# Patient Record
Sex: Female | Born: 1970 | Race: White | Hispanic: No | Marital: Married | Smoking: Current every day smoker
Health system: Southern US, Community
[De-identification: ages and names within clinical notes are randomized; demographics above are authoritative.]

## PROBLEM LIST (undated history)

## (undated) DIAGNOSIS — Z87440 Personal history of urinary (tract) infections: Secondary | ICD-10-CM

## (undated) DIAGNOSIS — Z8541 Personal history of malignant neoplasm of cervix uteri: Secondary | ICD-10-CM

## (undated) DIAGNOSIS — F419 Anxiety disorder, unspecified: Secondary | ICD-10-CM

## (undated) DIAGNOSIS — M797 Fibromyalgia: Secondary | ICD-10-CM

## (undated) DIAGNOSIS — M359 Systemic involvement of connective tissue, unspecified: Secondary | ICD-10-CM

## (undated) DIAGNOSIS — F17200 Nicotine dependence, unspecified, uncomplicated: Secondary | ICD-10-CM

## (undated) DIAGNOSIS — R110 Nausea: Secondary | ICD-10-CM

## (undated) DIAGNOSIS — G8929 Other chronic pain: Secondary | ICD-10-CM

## (undated) DIAGNOSIS — M4802 Spinal stenosis, cervical region: Secondary | ICD-10-CM

## (undated) DIAGNOSIS — K635 Polyp of colon: Secondary | ICD-10-CM

## (undated) DIAGNOSIS — M542 Cervicalgia: Secondary | ICD-10-CM

## (undated) DIAGNOSIS — R768 Other specified abnormal immunological findings in serum: Secondary | ICD-10-CM

## (undated) DIAGNOSIS — E039 Hypothyroidism, unspecified: Secondary | ICD-10-CM

## (undated) DIAGNOSIS — F32A Depression, unspecified: Secondary | ICD-10-CM

## (undated) DIAGNOSIS — F329 Major depressive disorder, single episode, unspecified: Secondary | ICD-10-CM

## (undated) HISTORY — DX: Polyp of colon: K63.5

## (undated) HISTORY — DX: Fibromyalgia: M79.7

## (undated) HISTORY — DX: Nausea: R11.0

## (undated) HISTORY — DX: Anxiety disorder, unspecified: F41.9

## (undated) HISTORY — DX: Personal history of urinary (tract) infections: Z87.440

## (undated) HISTORY — DX: Cervicalgia: M54.2

## (undated) HISTORY — PX: OTHER SURGICAL HISTORY: SHX169

## (undated) HISTORY — DX: Systemic involvement of connective tissue, unspecified: M35.9

## (undated) HISTORY — DX: Other specified abnormal immunological findings in serum: R76.8

## (undated) HISTORY — DX: Hypothyroidism, unspecified: E03.9

## (undated) HISTORY — DX: Personal history of malignant neoplasm of cervix uteri: Z85.41

## (undated) HISTORY — DX: Spinal stenosis, cervical region: M48.02

## (undated) HISTORY — DX: Other chronic pain: G89.29

## (undated) HISTORY — DX: Nicotine dependence, unspecified, uncomplicated: F17.200

---
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
## 1997-04-24 HISTORY — PX: VAGINAL HYSTERECTOMY: SHX2639

## 2002-04-24 HISTORY — PX: TRANSTHORACIC ECHOCARDIOGRAM: SHX275

## 2002-04-24 HISTORY — PX: OTHER SURGICAL HISTORY: SHX169

## 2003-04-25 HISTORY — PX: ESOPHAGOGASTRODUODENOSCOPY: SHX1529

## 2005-04-24 HISTORY — PX: OOPHORECTOMY: SHX86

## 2006-04-24 HISTORY — PX: OTHER SURGICAL HISTORY: SHX169

## 2006-11-04 ENCOUNTER — Emergency Department (HOSPITAL_COMMUNITY): Admission: EM | Admit: 2006-11-04 | Discharge: 2006-11-04 | Payer: Self-pay | Admitting: Emergency Medicine

## 2009-12-28 ENCOUNTER — Emergency Department (HOSPITAL_COMMUNITY): Admission: EM | Admit: 2009-12-28 | Discharge: 2009-12-28 | Payer: Self-pay | Admitting: Emergency Medicine

## 2010-04-24 HISTORY — PX: OOPHORECTOMY: SHX86

## 2011-12-08 DIAGNOSIS — Z0000 Encounter for general adult medical examination without abnormal findings: Secondary | ICD-10-CM

## 2013-02-26 ENCOUNTER — Other Ambulatory Visit: Payer: Self-pay | Admitting: Neurology

## 2013-02-26 DIAGNOSIS — R270 Ataxia, unspecified: Secondary | ICD-10-CM

## 2013-02-26 DIAGNOSIS — R269 Unspecified abnormalities of gait and mobility: Secondary | ICD-10-CM

## 2013-03-18 ENCOUNTER — Ambulatory Visit (HOSPITAL_COMMUNITY): Admission: RE | Admit: 2013-03-18 | Payer: BC Managed Care – PPO | Source: Ambulatory Visit

## 2013-03-18 ENCOUNTER — Other Ambulatory Visit (HOSPITAL_COMMUNITY): Payer: Self-pay

## 2013-03-24 ENCOUNTER — Ambulatory Visit (HOSPITAL_COMMUNITY): Payer: BC Managed Care – PPO

## 2013-04-01 ENCOUNTER — Ambulatory Visit (HOSPITAL_COMMUNITY): Payer: BC Managed Care – PPO

## 2013-04-07 ENCOUNTER — Ambulatory Visit (HOSPITAL_COMMUNITY): Admission: RE | Admit: 2013-04-07 | Payer: BC Managed Care – PPO | Source: Ambulatory Visit

## 2013-04-23 ENCOUNTER — Other Ambulatory Visit: Payer: Self-pay | Admitting: Neurology

## 2013-04-23 DIAGNOSIS — M542 Cervicalgia: Secondary | ICD-10-CM

## 2013-05-02 ENCOUNTER — Ambulatory Visit (HOSPITAL_COMMUNITY)
Admission: RE | Admit: 2013-05-02 | Discharge: 2013-05-02 | Disposition: A | Discharge status: Home / Self Care | Payer: BC Managed Care – PPO | Source: Ambulatory Visit | Attending: Neurology | Admitting: Neurology

## 2013-05-02 ENCOUNTER — Encounter (HOSPITAL_COMMUNITY): Payer: Self-pay

## 2013-05-02 DIAGNOSIS — R279 Unspecified lack of coordination: Secondary | ICD-10-CM | POA: Insufficient documentation

## 2013-05-02 DIAGNOSIS — R270 Ataxia, unspecified: Secondary | ICD-10-CM

## 2013-05-02 DIAGNOSIS — M5020 Other cervical disc displacement, unspecified cervical region: Secondary | ICD-10-CM | POA: Insufficient documentation

## 2013-05-02 DIAGNOSIS — R269 Unspecified abnormalities of gait and mobility: Secondary | ICD-10-CM | POA: Insufficient documentation

## 2013-05-02 DIAGNOSIS — M542 Cervicalgia: Secondary | ICD-10-CM

## 2013-05-02 DIAGNOSIS — M5380 Other specified dorsopathies, site unspecified: Secondary | ICD-10-CM | POA: Insufficient documentation

## 2013-05-02 DIAGNOSIS — G935 Compression of brain: Secondary | ICD-10-CM | POA: Insufficient documentation

## 2013-05-06 ENCOUNTER — Other Ambulatory Visit: Payer: Self-pay | Admitting: Neurology

## 2014-07-24 ENCOUNTER — Ambulatory Visit: Payer: Self-pay | Admitting: Family Medicine

## 2014-07-27 ENCOUNTER — Ambulatory Visit (INDEPENDENT_AMBULATORY_CARE_PROVIDER_SITE_OTHER): Payer: 59 | Admitting: Family Medicine

## 2014-07-27 ENCOUNTER — Encounter: Payer: Self-pay | Admitting: Family Medicine

## 2014-07-27 VITALS — BP 122/79 | HR 78 | Temp 99.0°F | Ht 61.0 in | Wt 120.0 lb

## 2014-07-27 DIAGNOSIS — F411 Generalized anxiety disorder: Secondary | ICD-10-CM

## 2014-07-27 DIAGNOSIS — M542 Cervicalgia: Secondary | ICD-10-CM

## 2014-07-27 DIAGNOSIS — M4802 Spinal stenosis, cervical region: Secondary | ICD-10-CM

## 2014-07-27 DIAGNOSIS — E039 Hypothyroidism, unspecified: Secondary | ICD-10-CM | POA: Diagnosis not present

## 2014-07-27 DIAGNOSIS — G8929 Other chronic pain: Secondary | ICD-10-CM

## 2014-07-27 LAB — TSH: TSH: 73.02 u[IU]/mL — ABNORMAL HIGH (ref 0.35–4.50)

## 2014-07-27 LAB — T4, FREE: FREE T4: 0.61 ng/dL (ref 0.60–1.60)

## 2014-07-27 NOTE — Progress Notes (Signed)
Pre visit review using our clinic review tool, if applicable. No additional management support is needed unless otherwise documented below in the visit note. 

## 2014-07-27 NOTE — Progress Notes (Addendum)
Office Note 08/02/2014  CC:  Chief Complaint  Patient presents with  . Establish Care   HPI:  Victoria Galvan is a 44 y.o. White female who is here to establish care. Patient's most recent primary MD: Dr. Ceasar MonsHarry Coletta in [place], [place]. Old records in EPIC/HL were reviewed prior to or during today's visit.  Says she is switching PCPs b/c unsatisfied with "monitoring" in the past.   Dr. Channing Muttersoy is her neurosurgeon (no surgery has been done, pt spoke of possible plan for epidural steroid injections but none done yet per pt), but she says Dr. Jenean Lindauoletta has been filling her pain meds. She says Dr. Channing Muttersoy drew blood and after results returned he told her he wants her to see a rheumatologist.  No referral has been made.  She says she was not told why she needs to see a rheumatologist.  She recently tried to establish with a Dr. Jillyn HiddenFulp, a primary care MD in [place] but after one visit the patient says she was called the next day and told she couldn't be a patient there anymore, yet she prescribed the patient pain meds (07/21/15)--small amount.  I called Dr. Jillyn HiddenFulp today and she corroborated pt's basic story but says no records have ever been received from Dr. Jenean Lindauoletta or Dr. Channing Muttersoy. Pt says she was rx'd #120 of her oxycodone 30mg  on 07/25/14.  She says she took one of these pills about 1 hour ago and most recent xanax was this morning.   She states she has never been to a pain clinic before but wants referral to one now.  She has stopped her levothyroxine since 05/11/14 in attempt to see how she felt off of it, not knowing that this was not advised.  She notes no wt gain or fluid accumulation since d/c of this med.  Past Medical History  Diagnosis Date  . Tobacco dependence   . Hypothyroidism   . Cervical spinal stenosis   . Chronic neck pain   . Anxiety disorder     with panic  . Cancer   . Syphilis   . Colon polyps   . History of frequent urinary tract infections     Past Surgical History  Procedure  Laterality Date  . Vaginal hysterectomy  1999    for cervical cancer per pt  . Tonsillectomy  1978  . Oophorectomy  2007    right  . Adhesions removed  2008  . Oophorectomy  2012    left    Family History  Problem Relation Age of Onset  . Arthritis Mother   . Cancer Mother     Lung  . Arthritis Father     History   Social History  . Marital Status: Married    Spouse Name: N/A  . Number of Children: 2  . Years of Education: College   Occupational History  . CNA Unemployed   Social History Main Topics  . Smoking status: Current Every Day Smoker  . Smokeless tobacco: Never Used  . Alcohol Use: No  . Drug Use: No  . Sexual Activity: Not on file   Other Topics Concern  . Not on file   Social History Narrative   Married,   Education: GED, Copywriter, advertisingCNA training.   Occupation: unemployed, formerly worked as a Careers information officerCNA    Outpatient Encounter Prescriptions as of 07/27/2014  Medication Sig  . alprazolam (XANAX) 2 MG tablet Take 2 mg by mouth at bedtime as needed for sleep.  Marland Kitchen. augmented betamethasone  dipropionate (DIPROLENE-AF) 0.05 % cream Apply topically 2 (two) times daily.  . Oxycodone HCl 10 MG TABS Take 30 mg by mouth.  . estradiol (ESTRACE) 1 MG tablet Take 1 mg by mouth daily.  Marland Kitchen levothyroxine (SYNTHROID, LEVOTHROID) 200 MCG tablet Take 200 mcg by mouth daily before breakfast.    No Known Allergies  ROS Review of Systems  Constitutional: Negative for fever and fatigue.  HENT: Negative for congestion and sore throat.   Eyes: Negative for visual disturbance.  Respiratory: Negative for cough.   Cardiovascular: Negative for chest pain.  Gastrointestinal: Negative for nausea and abdominal pain.  Genitourinary: Negative for dysuria.  Musculoskeletal: Negative for back pain and joint swelling.  Skin: Negative for rash.  Neurological: Negative for weakness and headaches.  Hematological: Negative for adenopathy.    PE; Blood pressure 122/79, pulse 78, temperature 99 F  (37.2 C), temperature source Temporal, height  (1.549 m), weight 120 lb (54.432 kg), SpO2 97 %. Gen: Alert, well appearing.  Patient is oriented to person, place, time, and situation. ZOX:WRUE: no injection, icteris, swelling, or exudate.  EOMI, PERRLA. Mouth: lips without lesion/swelling.  Oral mucosa pink and moist. Oropharynx without erythema, exudate, or swelling.  Neck - No masses or thyromegaly or limitation in range of motion CV: RRR, no m/r/g.   LUNGS: CTA bilat, nonlabored resps, good aeration in all lung fields. EXT: no clubbing, cyanosis, or edema.   Pertinent labs:  none  ASSESSMENT AND PLAN:   New pt; need to obtain old records.  1) Hypothyroidism; off meds per her own decision for last 4 mo. Will have her restart her L4 200 mcg qd and will check TSH, T4, T3 total today.  2) Chronic neck pain; has neurosurgeon (Dr. Channing Mutters).  Pain med mgmt is something that is not clear and of course this is concerning.  I declined to rx any pain meds for pt today and made it clear that she would need her pain managed by a pain mgmt MD OR if her labs did in fact recently indicate need for rheum consult then will order this next as appropriate.   Urine tox screen done today Will make approp referrals after approp old records/labs gathered.  3) Anxiety disorder with panic attacks: no rx's given today.  I told her I was hesitant to rx any controlled substances for her before getting approp results on urine tox screen today AND also getting old records.  An After Visit Summary was printed and given to the patient.  Spent 30 min with pt today, with >50% of this time spent in counseling and care coordination regarding the above problems.  Return in about 2 weeks (around 08/10/2014) for 30 min appt, pain/anxiety.

## 2014-07-28 ENCOUNTER — Telehealth: Payer: Self-pay | Admitting: Family Medicine

## 2014-07-28 NOTE — Telephone Encounter (Signed)
emmi mailed  °

## 2014-07-30 ENCOUNTER — Other Ambulatory Visit: Payer: Self-pay | Admitting: *Deleted

## 2014-07-30 DIAGNOSIS — E039 Hypothyroidism, unspecified: Secondary | ICD-10-CM

## 2014-08-02 ENCOUNTER — Encounter: Payer: Self-pay | Admitting: Family Medicine

## 2014-08-03 ENCOUNTER — Telehealth: Payer: Self-pay | Admitting: Family Medicine

## 2014-08-03 NOTE — Telephone Encounter (Signed)
Patient called back with preferred provider info: Dr. Layla BarterGoldberg [phone] & Dr. Jordan LikesSpivey [phone]. Patient is about to run out of her pain med so she is requesting appt asap

## 2014-08-03 NOTE — Telephone Encounter (Signed)
Patient is requesting pain management referral. She will CB to advise who her preferred providers are.

## 2014-08-04 ENCOUNTER — Other Ambulatory Visit: Payer: Self-pay | Admitting: Family Medicine

## 2014-08-04 ENCOUNTER — Telehealth: Payer: Self-pay | Admitting: Family Medicine

## 2014-08-04 DIAGNOSIS — M542 Cervicalgia: Principal | ICD-10-CM

## 2014-08-04 DIAGNOSIS — G8929 Other chronic pain: Secondary | ICD-10-CM

## 2014-08-04 DIAGNOSIS — M5030 Other cervical disc degeneration, unspecified cervical region: Secondary | ICD-10-CM

## 2014-08-04 NOTE — Telephone Encounter (Signed)
I'll put in referral order, but it is out of our hands as far as how quickly she will be seen by them. I recommend she ration her pain medications accordingly.-thx

## 2014-08-04 NOTE — Telephone Encounter (Signed)
Patient is nauseous and unable to eat. She is not running a fever. Can an Rx be sent to [facility]? Until an Rx be written is there an OTC medication that she can try?

## 2014-08-04 NOTE — Telephone Encounter (Signed)
Please advise OTC med?

## 2014-08-05 ENCOUNTER — Telehealth: Payer: Self-pay | Admitting: Family Medicine

## 2014-08-05 ENCOUNTER — Encounter: Payer: Self-pay | Admitting: Family Medicine

## 2014-08-05 NOTE — Telephone Encounter (Signed)
Patient notified. Patient did not know about referral to see Dr. Lendon ColonelHawks. Dr. Milinda CaveMcGowen aware.

## 2014-08-05 NOTE — Telephone Encounter (Signed)
Pt was returning a call from our office. Please call her at [phone]

## 2014-08-05 NOTE — Telephone Encounter (Signed)
Done

## 2014-08-05 NOTE — Telephone Encounter (Signed)
May try meclizine otc as directed on packaging.

## 2014-08-05 NOTE — Telephone Encounter (Signed)
Also, in review of the note from Dr. Jillyn HiddenFulp from [facility] (the MD she was x 1 visit right before switching to me), it says that she was referred by them to Dr. Nickola MajorHawkes, a rheumatologist in [place]. Ask pt if she knew about this and if she has an appt set or not.-thx

## 2014-08-05 NOTE — Telephone Encounter (Signed)
Patient notified. See other note.

## 2014-08-05 NOTE — Telephone Encounter (Signed)
LMOVM for pt to return call 

## 2014-08-06 ENCOUNTER — Telehealth: Payer: Self-pay | Admitting: Family Medicine

## 2014-08-06 ENCOUNTER — Other Ambulatory Visit: Payer: 59

## 2014-08-06 DIAGNOSIS — M791 Myalgia, unspecified site: Secondary | ICD-10-CM

## 2014-08-06 NOTE — Telephone Encounter (Signed)
Pt aware.  She is scheduled for lab visit today at 11:30.

## 2014-08-06 NOTE — Telephone Encounter (Signed)
I have received Dr. Temple Pacinioy's records. Pls tell pt that the lab he did that provoked the recommendation of referral to rheumatology was not totally conclusive.  There is a more specific test that I want her to come in for to clarify this, then if there is a lab abnormality of concern that requires rheumatology referral I will make it at that time.

## 2014-08-07 ENCOUNTER — Other Ambulatory Visit: Payer: Self-pay | Admitting: Family Medicine

## 2014-08-07 DIAGNOSIS — E039 Hypothyroidism, unspecified: Secondary | ICD-10-CM

## 2014-08-07 DIAGNOSIS — R768 Other specified abnormal immunological findings in serum: Secondary | ICD-10-CM | POA: Insufficient documentation

## 2014-08-07 DIAGNOSIS — M791 Myalgia, unspecified site: Secondary | ICD-10-CM

## 2014-08-07 DIAGNOSIS — R7689 Other specified abnormal immunological findings in serum: Secondary | ICD-10-CM

## 2014-08-07 LAB — ENA+DNA/DS+SJORGEN'S: ENA SM Ab Ser-aCnc: <0.2 AI (ref 0.0–0.9)

## 2014-08-07 LAB — ANA W/REFLEX: Anti Nuclear Antibody(ANA): POSITIVE — AB

## 2014-08-09 LAB — CK: CK TOTAL: 74 U/L (ref 24–173)

## 2014-08-10 ENCOUNTER — Ambulatory Visit (INDEPENDENT_AMBULATORY_CARE_PROVIDER_SITE_OTHER): Payer: 59 | Admitting: Family Medicine

## 2014-08-10 ENCOUNTER — Encounter: Payer: Self-pay | Admitting: Family Medicine

## 2014-08-10 VITALS — BP 111/79 | HR 79 | Temp 98.0°F | Ht 61.0 in | Wt 121.0 lb

## 2014-08-10 DIAGNOSIS — R1903 Right lower quadrant abdominal swelling, mass and lump: Secondary | ICD-10-CM | POA: Diagnosis not present

## 2014-08-10 DIAGNOSIS — R110 Nausea: Secondary | ICD-10-CM

## 2014-08-10 LAB — COMPREHENSIVE METABOLIC PANEL
ALT: 16 U/L (ref 0–35)
AST: 20 U/L (ref 0–37)
Albumin: 4.8 g/dL (ref 3.5–5.2)
Alkaline Phosphatase: 97 U/L (ref 39–117)
BUN: 10 mg/dL (ref 6–23)
CALCIUM: 10.1 mg/dL (ref 8.4–10.5)
CO2: 25 meq/L (ref 19–32)
Chloride: 103 mEq/L (ref 96–112)
Creatinine, Ser: 0.83 mg/dL (ref 0.40–1.20)
GFR: 79.91 mL/min (ref >60.00)
GLUCOSE: 86 mg/dL (ref 70–99)
POTASSIUM: 4.2 meq/L (ref 3.5–5.1)
SODIUM: 138 meq/L (ref 135–145)
Total Bilirubin: 0.4 mg/dL (ref 0.2–1.2)
Total Protein: 7.9 g/dL (ref 6.0–8.3)

## 2014-08-10 LAB — CBC WITH DIFFERENTIAL/PLATELET
BASOS ABS: 0.1 10*3/uL (ref 0.0–0.1)
Basophils Relative: 0.5 % (ref 0.0–3.0)
EOS ABS: 0.3 10*3/uL (ref 0.0–0.7)
Eosinophils Relative: 2.5 % (ref 0.0–5.0)
HCT: 38.1 % (ref 36.0–46.0)
Hemoglobin: 12.9 g/dL (ref 12.0–15.0)
LYMPHS PCT: 44.2 % (ref 12.0–46.0)
Lymphs Abs: 4.8 10*3/uL — ABNORMAL HIGH (ref 0.7–4.0)
MCHC: 33.9 g/dL (ref 30.0–36.0)
MCV: 96.3 fl (ref 78.0–100.0)
MONO ABS: 1.1 10*3/uL — AB (ref 0.1–1.0)
Monocytes Relative: 10.1 % (ref 3.0–12.0)
NEUTROS ABS: 4.6 10*3/uL (ref 1.4–7.7)
NEUTROS PCT: 42.7 % — AB (ref 43.0–77.0)
Platelets: 350 10*3/uL (ref 150.0–400.0)
RBC: 3.96 Mil/uL (ref 3.87–5.11)
RDW: 14.2 % (ref 11.5–15.5)
WBC: 10.9 10*3/uL — AB (ref 4.0–10.5)

## 2014-08-10 LAB — SPECIMEN STATUS REPORT

## 2014-08-10 LAB — POCT URINALYSIS DIPSTICK
BILIRUBIN UA: NEGATIVE
Blood, UA: NEGATIVE
GLUCOSE UA: NEGATIVE
KETONES UA: NEGATIVE
LEUKOCYTES UA: NEGATIVE
Nitrite, UA: NEGATIVE
PH UA: 6
PROTEIN UA: NEGATIVE
Spec Grav, UA: <=1.005
UROBILINOGEN UA: 0.2

## 2014-08-10 LAB — FANA STAINING PATTERNS: Speckled Pattern: 1:80 {titer}

## 2014-08-10 LAB — ANTINUCLEAR ANTIBODIES, IFA

## 2014-08-10 LAB — H. PYLORI ANTIBODY, IGG: H Pylori IgG: NEGATIVE

## 2014-08-10 LAB — ANGIOTENSIN CONVERTING ENZYME: ANGIO CONVERT ENZYME: 66 U/L (ref 14–82)

## 2014-08-10 MED ORDER — ONDANSETRON 8 MG PO TBDP: 8.0000 mg | ORAL_TABLET | Freq: Two times a day (BID) | ORAL | Status: DC

## 2014-08-10 NOTE — Progress Notes (Signed)
Pre visit review using our clinic review tool, if applicable. No additional management support is needed unless otherwise documented below in the visit note. 

## 2014-08-10 NOTE — Progress Notes (Signed)
OFFICE VISIT  08/13/2014   CC:  Chief Complaint  Patient presents with  . Follow-up    2 weeks  . Nausea    every morning   HPI:    Patient is a 44 y.o. Caucasian female who presents for 2 wk f/u after establishing care. Got back on her synthroid for her hypothyroidism.  Some old records have been received since last visit, most importantly Dr. Temple Pacinioy's labs done at his most recent o/v 07/08/14: showed +ANA w/out any titer done, Rh factor neg, normal sed rate and CRP. His last o/v dx was cervical spondylosis for which he recommended no precedural intervention as long as she was neurologically intact. Also reviewed her prior PCP's records (Dr. Jenean Lindauoletta) as well as recent initial/establish care visit with Dr. Jillyn HiddenFulp (whom she ended up not staying with--see note from last visit).  I entered a pain medicine referral order for chronic neck pain/cervical spondylosis on 08/04/14. Reviewed urine tox screen results from last visit with pt: appropriate results except + oxazepam (serax, plus possible metabolite of diaz, temaz, halaz, prazepam, and librium).  Pt states that just prior to seeing me and giving this specimen she went [facility] ED and was given an injection of "something", possibly valium. I will try to obtain records to corroborate this new info given today.   As far as rheum sx's, she does c/o years of pain "in all my joints and muscles", then when questioned further she essentially has pain symmetrically affecting both knees, both elbows, both hips, both wrists.  Notes long hx of swelling in fingers diffusely towards the end of the day. She denies hx of any joints swelling or turning red.  Says she has a rash occ on her face/cheeks that "looks like I'm wind-burned".  Tends to be easily sunburned. No hx of eye pain or eye redness.  No unexplained fevers.  Reports nausea in the mornings for the last 2-4 wks--pt unable to be specific with duration-, no abd pain.  Says it is not a  post-prandial occurrence.  Says promethazine rx'd in recent past by the ED (?which one?) helped but she goes on to say "another pill that dissolves under your tongue" helped better/quicker for this in the past.  This makes me even more unclear now on her report of when her nausea actually started becoming a problem.  At any rate, no fevers, no blood in stool, no wt loss since last visit with me. Denies urinary urgency, frequency, or dysuria or hematuria.    Past Medical History  Diagnosis Date  . Tobacco dependence   . Hypothyroidism   . Cervical spinal stenosis   . Chronic neck pain   . Anxiety disorder     with panic  . History of cervical cancer   . Colon polyps     Pt states "colon polyp was removed when they did my hysterectomy"--need old records to veriffy.  . History of frequent urinary tract infections   . Fibromyalgia     per pt report    Past Surgical History  Procedure Laterality Date  . Vaginal hysterectomy  1999    for cervical cancer per pt  . Tonsillectomy  1978  . Oophorectomy  2007    right-path neg  . Adhesions removed  2008  . Oophorectomy  2012    left  . Esophagogastroduodenoscopy  2005    mild gastritis; h pylori neg.  Says she has had esoph dilation several times  . Transthoracic echocardiogram  2004    Normal  . Holter monitor  2004    Normal  . Mri brain  2002;2004    Normal    Outpatient Prescriptions Prior to Visit  Medication Sig Dispense Refill  . alprazolam (XANAX) 2 MG tablet Take 2 mg by mouth at bedtime as needed for sleep.    Marland Kitchen augmented betamethasone dipropionate (DIPROLENE-AF) 0.05 % cream Apply topically 2 (two) times daily.    Marland Kitchen estradiol (ESTRACE) 1 MG tablet Take 1 mg by mouth daily.    Marland Kitchen levothyroxine (SYNTHROID, LEVOTHROID) 200 MCG tablet Take 200 mcg by mouth daily before breakfast.    . Oxycodone HCl 10 MG TABS Take 30 mg by mouth.     No facility-administered medications prior to visit.    Allergies  Allergen Reactions   . Aspirin Other (See Comments)    Unknown rx; this allergy was simply noted in former PCP's records    ROS As per HPI  PE: Blood pressure 111/79, pulse 79, temperature 98 F (36.7 C), temperature source Oral, height  (1.549 m), weight 121 lb (54.885 kg), SpO2 96 %.  Pt examined with Faythe Ghee, CMA, as chaperone. Gen: Alert, well appearing.  Patient is oriented to person, place, time, and situation. XBM:WUXL: no injection, icteris, swelling, or exudate.  EOMI, PERRLA. Mouth: lips without lesion/swelling.  Oral mucosa pink and moist. Oropharynx without erythema, exudate, or swelling.  ABD: soft, nondistended.  BS normal.  Abd aorta palpable pulsations but no bruit.  RLQ TTp with question of RLQ fullness/mass. Also some TTP directly over her umbillicus but no mass palpable here.  No HSM,   EXT: no clubbing, cyanosis, or edema.  Musculoskeletal: no joint swelling, erythema, warmth, or tenderness.  ROM of all joints intact.  LABS:  CC UA today: NEG/normal   08/06/14: ANA pos, speckled pattern, 1:80. RNP Ab >8 but other specific Ab were neg (dsDNA, SSA, SSB, SM Ab).  On 08/06/14:  ACE level 66 CK total 74  IMPRESSION AND PLAN:  1) Morning nausea x at least 1 mo, with question of RLQ fullness/mass.  She has hx of cervical ca and has had hysterectomy with both tubes and ovaries removed.  Will check CMET, CBC w/diff, lipase, h pylori ab and will check pelvic u/s. Zofran ODT 8 mg bid prn rx'd, #60, RF x 1.  2) Positive ANA, +RNP Ab. Of unclear clinical significance but will ask rheumatology to see her, as per Dr. Temple Pacini initial plan. It is unclear from past correspondence with Dr. Temple Pacini office whether a referral was already made to Dr. Fatima Sanger office (pt does not know), so I'll try to verify this before making new referral today.  3) Hypothyroidism: she has been back on L4 replacement now for about 2 wks. Recheck TSH 6 wks.  4) Chronic neck pain; pain mgmt referral has been  made and pt is awaiting further appt info at this time. UDS with approp results last visit except + oxazepam.  See pt's explanation in HPI above.  Will try to obtain [facility] ED records.  An After Visit Summary was printed and given to the patient.  FOLLOW UP: Return in about 6 weeks (around 09/21/2014) for f/u nausea and f/u referrals(30 min).

## 2014-08-11 ENCOUNTER — Other Ambulatory Visit: Payer: Self-pay | Admitting: Family Medicine

## 2014-08-11 DIAGNOSIS — R1903 Right lower quadrant abdominal swelling, mass and lump: Secondary | ICD-10-CM

## 2014-08-11 DIAGNOSIS — R110 Nausea: Secondary | ICD-10-CM

## 2014-08-11 LAB — LIPASE: LIPASE: 38 U/L (ref 11.0–59.0)

## 2014-08-13 ENCOUNTER — Ambulatory Visit (HOSPITAL_COMMUNITY): Admission: RE | Admit: 2014-08-13 | Payer: 59 | Source: Ambulatory Visit

## 2014-08-13 ENCOUNTER — Other Ambulatory Visit (HOSPITAL_COMMUNITY): Payer: 59

## 2014-08-13 ENCOUNTER — Other Ambulatory Visit: Payer: Self-pay | Admitting: Family Medicine

## 2014-08-13 DIAGNOSIS — R768 Other specified abnormal immunological findings in serum: Secondary | ICD-10-CM

## 2014-08-13 DIAGNOSIS — M2550 Pain in unspecified joint: Secondary | ICD-10-CM

## 2014-08-18 ENCOUNTER — Encounter: Payer: Self-pay | Admitting: Family Medicine

## 2014-08-19 ENCOUNTER — Ambulatory Visit (HOSPITAL_COMMUNITY)
Admission: RE | Admit: 2014-08-19 | Discharge: 2014-08-19 | Disposition: A | Discharge status: Home / Self Care | Payer: 59 | Source: Ambulatory Visit | Attending: Family Medicine | Admitting: Family Medicine

## 2014-08-19 DIAGNOSIS — R110 Nausea: Secondary | ICD-10-CM | POA: Diagnosis not present

## 2014-08-19 DIAGNOSIS — R1903 Right lower quadrant abdominal swelling, mass and lump: Secondary | ICD-10-CM | POA: Diagnosis present

## 2014-08-19 NOTE — Progress Notes (Signed)
Quick Note:  Please notify patient that her pelvic ultrasound was normal. There was quite a bit of gas and stool in her bowels, and this makes them distended and this is likely the fullness that I was able to feel when I examined her abdomen at last visit. Reassure patient please. No further imaging necessary. Thanks. ______

## 2014-08-20 ENCOUNTER — Other Ambulatory Visit: Payer: Self-pay | Admitting: Family Medicine

## 2014-08-20 ENCOUNTER — Telehealth: Payer: Self-pay | Admitting: Family Medicine

## 2014-08-20 MED ORDER — OXYCODONE HCL 10 MG PO TABS: ORAL_TABLET | ORAL | Status: DC

## 2014-08-20 NOTE — Telephone Encounter (Signed)
Patient called back. She got an appt with [facility] 08/24/14 at 1:00PM. She needs just enough Oxycodone 30MG  1 4x's a day to make it to the appt. She uses [facility].

## 2014-08-20 NOTE — Telephone Encounter (Signed)
I know she got her most recent 1 mo supply of oxycodone on 07/25/14, so I did a rx for 3 days-worth of oxycodone that can be filled on 4/31/16.

## 2014-08-20 NOTE — Telephone Encounter (Signed)
Patient has never heard from Dr. Cyndia DiverSpivey's office. I faxed the pain management referral to Dr. Eduard ClosBethea & she refused patient & recommended that pt be seen at [facility]. I have faxed the referral to [facility]. The patient has contacted their office & they will not be able to make her appt before she runs out of her narcotic. Patient runs out 08/23/14. She wants to know if she can get an Rx to hold her until she gets in to Dr. Christianne DolinHeag's office?

## 2014-08-20 NOTE — Telephone Encounter (Signed)
Please advise 

## 2014-08-20 NOTE — Telephone Encounter (Signed)
I printed new rx for oxycodone with fill on/after date of 4/30 since prev rx said 4/31 and there are only 30 days in April. Initial rx shredded.

## 2014-08-20 NOTE — Telephone Encounter (Signed)
Pt sates that her pharmacy is not open on Sunday.  She would like it to be filled Saturday if possible.

## 2014-08-21 ENCOUNTER — Other Ambulatory Visit: Payer: Self-pay | Admitting: Family Medicine

## 2014-08-21 MED ORDER — OXYCODONE HCL 30 MG PO TABS: ORAL_TABLET | ORAL | Status: DC

## 2014-09-03 ENCOUNTER — Telehealth: Payer: Self-pay | Admitting: Family Medicine

## 2014-09-03 NOTE — Telephone Encounter (Signed)
LOV 08/10/14. Please advised. Thanks.

## 2014-09-03 NOTE — Telephone Encounter (Signed)
OK to RF levothyroxine 200 mcg tabs, 1 tab po qd, #30, one additional RF.  She needs lab visit in 3-4 weeks to recheck TSH, dx hypothyroidism. Also, before authorizing RF of her estradiol, pls call her pharmacy and clarify the dosing of her last rx b/c in our chart we have that it was 1 tab per day (but she requested dosing of 2 tabs per day??)-thx

## 2014-09-03 NOTE — Telephone Encounter (Signed)
Estrediol 1 MG 2 tablets a day  Levothyroxine 200 MCG 1 tablet a day [facility]  Patient no longer sees her OBGYN since she has had a hysterectomy so this will be a new Rx

## 2014-09-04 MED ORDER — LEVOTHYROXINE SODIUM 200 MCG PO TABS: 200.0000 ug | ORAL_TABLET | Freq: Every day | ORAL | Status: DC

## 2014-09-04 MED ORDER — ESTRADIOL 1 MG PO TABS: 1.0000 mg | ORAL_TABLET | Freq: Every day | ORAL | Status: DC

## 2014-09-04 NOTE — Telephone Encounter (Signed)
Sp[facility]w at [facility] and he stated that pt is taking Estradiol 1mg  (up to 2 tab daily) take 2x2days then 1x1day. I have sent in Rx for thyroid medication and I have left message for pt to call back to advised about labs.

## 2014-09-04 NOTE — Telephone Encounter (Signed)
Left message for pt to call back  °

## 2014-09-04 NOTE — Telephone Encounter (Signed)
I sent rx for estradiol 1mg , 1 tab qd.  I recommend against taking 2 mg dosing.

## 2014-09-04 NOTE — Telephone Encounter (Signed)
Pt advised and voiced understandings.

## 2014-09-18 ENCOUNTER — Ambulatory Visit: Payer: 59 | Admitting: Family Medicine

## 2014-09-22 ENCOUNTER — Ambulatory Visit: Payer: 59 | Admitting: Family Medicine

## 2014-09-24 ENCOUNTER — Encounter: Payer: Self-pay | Admitting: Family Medicine

## 2014-09-25 ENCOUNTER — Other Ambulatory Visit (INDEPENDENT_AMBULATORY_CARE_PROVIDER_SITE_OTHER): Payer: 59

## 2014-09-25 DIAGNOSIS — E039 Hypothyroidism, unspecified: Secondary | ICD-10-CM | POA: Diagnosis not present

## 2014-09-25 LAB — TSH: TSH: 8.98 u[IU]/mL — AB (ref 0.35–4.50)

## 2014-09-28 ENCOUNTER — Other Ambulatory Visit: Payer: Self-pay | Admitting: Family Medicine

## 2014-09-28 ENCOUNTER — Ambulatory Visit: Payer: 59 | Admitting: Family Medicine

## 2014-09-28 MED ORDER — LEVOTHYROXINE SODIUM 25 MCG PO CAPS
ORAL_CAPSULE | ORAL | Status: DC
Start: 2014-09-28 — End: 2016-05-09

## 2014-09-28 NOTE — Addendum Note (Signed)
Addended by: Westley HummerKIRBY, Kristiann Noyce L on: 09/28/2014 11:09 AM   Modules accepted: Orders

## 2014-10-07 ENCOUNTER — Encounter: Payer: Self-pay | Admitting: Family Medicine

## 2014-10-07 ENCOUNTER — Ambulatory Visit (INDEPENDENT_AMBULATORY_CARE_PROVIDER_SITE_OTHER): Payer: 59 | Admitting: Family Medicine

## 2014-10-07 VITALS — BP 123/85 | HR 79 | Temp 98.0°F | Resp 16 | Wt 121.0 lb

## 2014-10-07 DIAGNOSIS — R768 Other specified abnormal immunological findings in serum: Secondary | ICD-10-CM

## 2014-10-07 DIAGNOSIS — K219 Gastro-esophageal reflux disease without esophagitis: Secondary | ICD-10-CM

## 2014-10-07 DIAGNOSIS — F411 Generalized anxiety disorder: Secondary | ICD-10-CM

## 2014-10-07 DIAGNOSIS — M542 Cervicalgia: Secondary | ICD-10-CM | POA: Diagnosis not present

## 2014-10-07 DIAGNOSIS — M47812 Spondylosis without myelopathy or radiculopathy, cervical region: Secondary | ICD-10-CM

## 2014-10-07 DIAGNOSIS — E039 Hypothyroidism, unspecified: Secondary | ICD-10-CM

## 2014-10-07 DIAGNOSIS — G8929 Other chronic pain: Secondary | ICD-10-CM

## 2014-10-07 MED ORDER — ALPRAZOLAM 1 MG PO TABS: ORAL_TABLET | ORAL | Status: DC

## 2014-10-07 MED ORDER — PANTOPRAZOLE SODIUM 40 MG PO TBEC: 40.0000 mg | DELAYED_RELEASE_TABLET | Freq: Every day | ORAL | Status: DC

## 2014-10-07 MED ORDER — OXYCODONE HCL 30 MG PO TABS: ORAL_TABLET | ORAL | Status: DC

## 2014-10-07 NOTE — Progress Notes (Signed)
OFFICE NOTE  10/07/2014  CC:  Chief Complaint  Patient presents with  . Follow-up    6 week f/u.   HPI: Patient is a 44 y.o. Caucasian female who is here for 2 mo f/u hypothyroidism, morning nausea, and insomnia. All labs last visit were normal and her pelvic ultrasound was normal. Last o/v we were still waiting on her to get in for her first visit with pain mgmt MD for her chronic neck pain secondary to spondylosis.  [facility] once, then they went out of her network and so she needs another referral (somewhere in [place] per pt report today).  She says they rx'd oxycodone 30mg  and started opana.  Says the opana made her nauseated so she eventually flushed them.  Takes oxycodone 1 tab qid  Took her last one early this morning.  Took alprazolam this morning.  She has been on 2mg  qid for a long time and wants to be weened down on this med. I also referred her to rheum for +ANA and +RNP Ab.  Has initial appt 10/13/14.  Still with nausea problem but now says it is night-time.  She is not requiring as many zofran, says it is getting better over time. She does have burning a lot in her throat.  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  Currently no dysphagia or food regurgitation.   Pertinent PMH:  Past medical, surgical, social, and family history reviewed and no changes are noted since last office visit.  MEDS:  Outpatient Prescriptions Prior to Visit  Medication Sig Dispense Refill  . alprazolam (XANAX) 2 MG tablet Take 2 mg by mouth at bedtime as needed for sleep.    Marland Kitchen augmented betamethasone dipropionate (DIPROLENE-AF) 0.05 % cream Apply topically 2 (two) times daily.    Marland Kitchen estradiol (ESTRACE) 1 MG tablet Take 1 tablet (1 mg total) by mouth daily. 30 tablet 6  . levothyroxine (SYNTHROID, LEVOTHROID) 200 MCG tablet Take 1 tablet (200 mcg total) by mouth daily before breakfast. 30 tablet 1  . Levothyroxine Sodium 25 MCG CAPS 1  tab po qd (to be taken with 200 mcg levothyroxine tab for total daily dose of 225 mcg) 30 capsule 3  . ondansetron (ZOFRAN-ODT) 8 MG disintegrating tablet Take 1 tablet (8 mg total) by mouth 2 (two) times daily. 60 tablet 1  . oxycodone (ROXICODONE) 30 MG immediate release tablet 1 tab po qid prn pain 16 tablet 0   No facility-administered medications prior to visit.    PE: Blood pressure 123/85, pulse 79, temperature 98 F (36.7 C), temperature source Oral, resp. rate 16, weight 121 lb (54.885 kg), SpO2 100 %. Gen: Alert, well appearing.  Patient is oriented to person, place, time, and situation. AFFECT: pleasant, lucid thought and speech. No further exam today.  LABS:   Chemistry      Component Value Date/Time   NA 138 08/10/2014 1418   K 4.2 08/10/2014 1418   CL 103 08/10/2014 1418   CO2 25 08/10/2014 1418   BUN 10 08/10/2014 1418   CREATININE 0.83 08/10/2014 1418      Component Value Date/Time   CALCIUM 10.1 08/10/2014 1418   ALKPHOS 97 08/10/2014 1418   AST 20 08/10/2014 1418   ALT 16 08/10/2014 1418   BILITOT 0.4 08/10/2014 1418     Lab Results  Component Value Date   TSH 8.98* 09/25/2014   Lab Results  Component Value Date   WBC 10.9* 08/10/2014   HGB 12.9 08/10/2014   HCT 38.1 08/10/2014   MCV 96.3 08/10/2014   PLT 350.0 08/10/2014   Lab Results  Component Value Date   LIPASE 38.0 08/10/2014    IMPRESSION AND PLAN:  1) Chronic neck pain/cervical spondylosis: will verify pt's info given today about an insurance requirement forcing her to change pain mgmt clinics.  I did urine tox screen today, filled rx for oxycodone  (1 tab qid, #120).  I'll order new pain mgmt referral.  2) Chronic anxiety with hx of panic: ween down xanax some since she his on such high doses of this med combined with high doses of narcotics.  Changed to  alprazolam qid, #120, RF x 3.  3) Positive ANA, + RNP Ab: has rheum consultation in about a week.  4) GERD; possibly the  cause of her recurrent nausea.  Needs to get back on daily PPI: pantoprazole  qd rx'd today.  5) Hypothyroidism: taking 225 mcg qd since last TSH check 2 wks ago: recheck TSH 6 wks or so.  An After Visit Summary was printed and given to the patient.  FOLLOW UP: 4 mo

## 2014-10-07 NOTE — Progress Notes (Signed)
Pre visit review using our clinic review tool, if applicable. No additional management support is needed unless otherwise documented below in the visit note. 

## 2014-10-07 NOTE — Addendum Note (Signed)
Addended by: Jeoffrey Massed on: 10/07/2014 01:40 PM   Modules accepted: Orders

## 2014-10-08 ENCOUNTER — Ambulatory Visit: Payer: 59 | Admitting: Family Medicine

## 2014-10-19 ENCOUNTER — Telehealth: Payer: Self-pay | Admitting: Family Medicine

## 2014-10-19 NOTE — Telephone Encounter (Signed)
Pt has been seen by Dr. Lendon ColonelHawks. Pt states she is RA and Lupous positive. They have prescribed her "plaquewinel" but it making her sick. Dr. Lendon ColonelHawks office told her to call Dr. Milinda CaveMcGowen to get a RX for phenergan to help her until her system adjusts to new med's. The current nausea med's she is taking does not help.

## 2014-10-19 NOTE — Telephone Encounter (Signed)
Pls notify pt that she needs to get nausea med prescribed by her rheumatologist since pt says it is coming from a medication that rheum is rx'ing.-thx

## 2014-10-20 NOTE — Telephone Encounter (Signed)
Pt advised by Jewel Baizearcy.

## 2014-10-20 NOTE — Telephone Encounter (Signed)
Left message to call back  

## 2014-10-27 ENCOUNTER — Encounter: Payer: Self-pay | Admitting: Family Medicine

## 2014-10-30 ENCOUNTER — Other Ambulatory Visit: Payer: Self-pay | Admitting: Family Medicine

## 2014-11-04 ENCOUNTER — Other Ambulatory Visit: Payer: Self-pay | Admitting: *Deleted

## 2014-11-04 NOTE — Telephone Encounter (Signed)
Pt LMOM requesting refill for Oxycodone.  LOV: 10/07/14 NOV: None Last written: 10/07/14 #120 w/ Wende [id number] She stated that she would like to pick this up on 10/07/14.  Per Diane pt told her that she is not going to go back to Dr. Melton AlarHiggs because she over heard them making racial comments about her and other nasty comments. Diane is working on referral to a different pain clinic.  Please advise. Thanks.

## 2014-11-04 NOTE — Telephone Encounter (Signed)
Pt aware.  She states she can't get into her current pain clinic until the 19th and she will be out of oxycodone.  I told her to split the tablets and make them last.  She states that it won't work and she wants to know what to do for withdraw sxs.  Please advise.

## 2014-11-04 NOTE — Telephone Encounter (Signed)
FYI-I called pt and told her I will not prescribe any pain meds for her. I recommended she keep her current pain mgmt MD until new pain clinic referral has been set up. She was not happy, so it would not surprise me if she calls back and either requests her records or wants to complain to someone.

## 2014-11-04 NOTE — Telephone Encounter (Signed)
Told pt to make her oxycodone last as long as she could, but there is no danger to stopping medication.  Warned pt, she may feel bad but again no danger and there is no medication for withdraw sxs.

## 2014-11-06 ENCOUNTER — Encounter: Payer: Self-pay | Admitting: Family Medicine

## 2014-11-06 ENCOUNTER — Ambulatory Visit (INDEPENDENT_AMBULATORY_CARE_PROVIDER_SITE_OTHER): Payer: 59 | Admitting: Family Medicine

## 2014-11-06 VITALS — BP 121/80 | HR 85 | Temp 98.3°F | Resp 16 | Ht 61.0 in | Wt 117.0 lb

## 2014-11-06 DIAGNOSIS — J208 Acute bronchitis due to other specified organisms: Secondary | ICD-10-CM

## 2014-11-06 DIAGNOSIS — G894 Chronic pain syndrome: Secondary | ICD-10-CM

## 2014-11-06 DIAGNOSIS — E039 Hypothyroidism, unspecified: Secondary | ICD-10-CM

## 2014-11-06 MED ORDER — PREDNISONE 20 MG PO TABS: ORAL_TABLET | ORAL | Status: DC

## 2014-11-06 NOTE — Telephone Encounter (Signed)
Pt in office today for HFU.

## 2014-11-06 NOTE — Progress Notes (Signed)
OFFICE NOTE  11/06/2014  CC:  Chief Complaint  Patient presents with  . Hospitalization Follow-up    Chest Cold and difficulty breathing     HPI: Patient is a 44 y.o. Caucasian female who is here for ED visit f/u for cough. I reviewed these records from [facility] ED from 11/04/14, dx was URI.  W/u included CT chest (normal), CBC, CMET, UA, EKG.  Symptomatic care recommended. She is a current smoker. "I feel like crap".  Cough constant, worse hs.  Lost voice. No fever in last 3d.  Mild ST, no HA.  Feeling tight in chest and hears/feels mucous rattling in chest. No wheezing.  Sensation of feeling SOB+.   She has been ill with these sx's for the last 8-9 days.  Sx's have progressively worsened except the fevers have resolved.  Cough is productive of thick,green mucous with a little bit of blood streaks in it.  She brought up pain mgmt today: she is choosing not to return to [facility] pain mgmt center: cites various financial and time constraint type reasons.  She asked again if I would rx pain med for her until she got to a new pain mgmt office and I said I was not going to re-assume the responsibility of prescribing her any pain pills. She was upset with my decision, asked about withdrawal sx's to watch for.  I reassured her that although she would likely feel restless and irritable and be in worse pain, she was not going to be in any imminent physical danger from the withdrawal of opioids.     Pertinent PMH:  Past medical, surgical, social, and family history reviewed and no changes are noted since last office visit.  MEDS:  Outpatient Prescriptions Prior to Visit  Medication Sig Dispense Refill  . ALPRAZolam (XANAX) 1 MG tablet 1 tab po qid 120 tablet 3  . augmented betamethasone dipropionate (DIPROLENE-AF) 0.05 % cream Apply topically 2 (two) times daily.    Marland Kitchen estradiol (ESTRACE) 1 MG tablet Take 1 tablet (1 mg total) by mouth daily. 30 tablet 6  . levothyroxine (SYNTHROID,  LEVOTHROID) 200 MCG tablet Take 1 tablet (200 mcg total) by mouth daily before breakfast. 30 tablet 1  . Levothyroxine Sodium 25 MCG CAPS 1 tab po qd (to be taken with 200 mcg levothyroxine tab for total daily dose of 225 mcg) 30 capsule 3  . ondansetron (ZOFRAN-ODT) 8 MG disintegrating tablet 1 tab po bid prn nausea 60 tablet 0  . oxycodone (ROXICODONE) 30 MG immediate release tablet 1 tab po qid prn pain 120 tablet 0  . pantoprazole (PROTONIX) 40 MG tablet Take 1 tablet (40 mg total) by mouth daily. 30 tablet 6   No facility-administered medications prior to visit.    PE: Blood pressure 121/80, pulse 85, temperature 98.3 F (36.8 C), temperature source Oral, resp. rate 16, height  (1.549 m), weight 117 lb (53.071 kg), SpO2 100 %. VS: noted--normal. Gen: alert, NAD, NONTOXIC APPEARING. HEENT: eyes without injection, drainage, or swelling.  Ears: EACs clear, TMs with normal light reflex and landmarks.  Nose: Scant amount of some dried, crusty exudate adherent to mildly injected mucosa.  No purulent d/c.  No paranasal sinus TTP.  No facial swelling.  Throat and mouth without focal lesion.  No pharyngial swelling, erythema, or exudate.   Neck: supple, no LAD.   LUNGS: CTA bilat, nonlabored resps.  She had a forceful cough during one exhalation. CV: RRR, no m/r/g. EXT: no c/c/e SKIN: no  rash  LABS: none today Lab Results  Component Value Date   TSH 8.98* 09/25/2014    IMPRESSION AND PLAN:  1) Acute bronchitis, suspect viral etiology complicated by tobacco use. I rx'd prednisone 40mg  qd x 5d, then 20mg  qd x 5d. She'll continue the phenergan w/codeine cough med that [facility] her 2 days ago. I don't see any need for albuterol in this case.  2) Chronic pain syndrome: she is switching pain clinics.  I have declined to rx any pain meds for her. We discussed w/drawal sx's.  I recommended she stick with the [facility] clinic for one more visit in order to stay on the pain pill  regimen they had her on until she gets in with new pain mgmt clinic but she is adamant about not going back there.  3) Hypothyroidism: last TSH still up, dose titrated again, due for TSH recheck in about 3 wks.  An After Visit Summary was printed and given to the patient.  FOLLOW UP: 4 mo f/u anxiety.  Needs lab visit to recheck TSH in 3 wks.

## 2014-11-06 NOTE — Progress Notes (Signed)
Pre visit review using our clinic review tool, if applicable. No additional management support is needed unless otherwise documented below in the visit note. 

## 2014-11-11 ENCOUNTER — Telehealth: Payer: Self-pay | Admitting: Family Medicine

## 2014-11-11 NOTE — Telephone Encounter (Signed)
Spoke to pt and she stated that she asked Dr. Milinda CaveMcGowen to decrease her anxiety medication to see if she could manage with a lower dose. She stated that she is not doing well. She stated that she has had several anxiety attacks and would like to go back to her previous dose. Please advise. Thanks.

## 2014-11-11 NOTE — Telephone Encounter (Signed)
Victoria Galvan called and would like a nurse to call her back in regards to her anxiety meds. Please call her at [phone]./dh

## 2014-11-11 NOTE — Telephone Encounter (Signed)
Pt advised and voiced understanding.   

## 2014-11-11 NOTE — Telephone Encounter (Signed)
OK for her to return to previous dosing using the current pills she has.  No new rx needed at this time.-thx

## 2014-11-16 ENCOUNTER — Other Ambulatory Visit: Payer: Self-pay | Admitting: *Deleted

## 2014-11-16 MED ORDER — ALPRAZOLAM 2 MG PO TABS: ORAL_TABLET | ORAL | Status: DC

## 2014-11-16 NOTE — Telephone Encounter (Signed)
Pt advised and voiced understanding.  Rx faxed.  

## 2014-11-16 NOTE — Telephone Encounter (Signed)
Alprazolam  rx printed.

## 2014-11-16 NOTE — Telephone Encounter (Signed)
Fax from pharmacy for alprazolam with note stating that pt says this should have been  not . LOV: 11/06/14 NOV: None Last written: 10/07/14 #120 w 3RF Please advise. Thanks.

## 2014-11-18 ENCOUNTER — Telehealth: Payer: Self-pay | Admitting: Family Medicine

## 2014-11-18 NOTE — Telephone Encounter (Signed)
Left message for pt to call back  °

## 2014-11-18 NOTE — Telephone Encounter (Signed)
RF request for oxycodone LOV: 11/06/14 Next ov: None Last written: 10/07/14 #120 w/ 0RF Please advise. Thanks.

## 2014-11-18 NOTE — Telephone Encounter (Signed)
Patient is begging for pain medication Rx. She is taking tylenol and aleve but is still in so much pain she can only lie in a fetal position all day.

## 2014-11-18 NOTE — Telephone Encounter (Signed)
The answer is no. 

## 2014-11-18 NOTE — Telephone Encounter (Signed)
Darcy advised pt.

## 2014-11-23 ENCOUNTER — Telehealth: Payer: Self-pay | Admitting: Family Medicine

## 2014-11-23 MED ORDER — BENZONATATE 200 MG PO CAPS: 200.0000 mg | ORAL_CAPSULE | Freq: Three times a day (TID) | ORAL | Status: DC | PRN

## 2014-11-23 NOTE — Telephone Encounter (Signed)
Victoria Galvan is requesting a refill on her cough medicine RX. Please call and let her know when it can be picked up.

## 2014-11-23 NOTE — Telephone Encounter (Signed)
Please advise. Thanks.  

## 2014-11-23 NOTE — Telephone Encounter (Signed)
Tessalon perles called in for pt's cough.

## 2014-11-23 NOTE — Telephone Encounter (Signed)
Left detailed message on cell vm, okay per DPR.  

## 2014-11-23 NOTE — Telephone Encounter (Signed)
Per Dr. Milinda Cave will not fill cough medication that has codeine because it is a controlled drug. Pt advised by Jewel Baize and stated that she just needs something to help with her cough it does not have to have codeine in it. Please advise. Thanks.

## 2015-01-06 ENCOUNTER — Other Ambulatory Visit: Payer: Self-pay | Admitting: Family Medicine

## 2015-01-06 NOTE — Telephone Encounter (Signed)
RF request for ondansetran LOV: 11/06/14  Next ov: None Last written: 10/30/14 #60 w/ 1OX  Please advise. Thanks.

## 2015-02-25 ENCOUNTER — Other Ambulatory Visit: Payer: Self-pay | Admitting: Family Medicine

## 2015-02-25 NOTE — Telephone Encounter (Signed)
RF request for levothyroxine LOV: 11/06/14 Next ov: None Last written: 09/28/14 #30 w/ 3RF  07/27/14 TSH 73.02  Please advise. Thanks.

## 2015-02-25 NOTE — Telephone Encounter (Signed)
Pt must come in for lab visit for TSH check before any RF so I can know what dose to rx her at this time (Dx is hypothyroidism unspecified)-thx

## 2015-02-26 NOTE — Telephone Encounter (Signed)
Left message for pt to call back  °

## 2015-03-05 NOTE — Telephone Encounter (Signed)
Left detailed message on cell vm, okay per DPR.  

## 2015-03-08 ENCOUNTER — Other Ambulatory Visit: Payer: 59

## 2015-04-05 ENCOUNTER — Telehealth: Payer: Self-pay | Admitting: Family Medicine

## 2015-04-05 NOTE — Telephone Encounter (Signed)
[facility] Her xanax was in her purse. No police report has been filed.

## 2015-04-05 NOTE — Telephone Encounter (Signed)
Patient requested CB. Would not give any additional information.

## 2015-04-30 ENCOUNTER — Ambulatory Visit: Payer: Self-pay | Admitting: Family Medicine

## 2015-05-03 ENCOUNTER — Ambulatory Visit: Payer: Self-pay | Admitting: Family Medicine

## 2015-05-13 ENCOUNTER — Encounter: Payer: Self-pay | Admitting: Family Medicine

## 2015-05-13 ENCOUNTER — Ambulatory Visit (INDEPENDENT_AMBULATORY_CARE_PROVIDER_SITE_OTHER): Payer: BLUE CROSS/BLUE SHIELD | Admitting: Family Medicine

## 2015-05-13 VITALS — BP 114/78 | HR 73 | Temp 97.4°F | Resp 16 | Ht 61.0 in | Wt 119.8 lb

## 2015-05-13 DIAGNOSIS — E039 Hypothyroidism, unspecified: Secondary | ICD-10-CM

## 2015-05-13 DIAGNOSIS — R110 Nausea: Secondary | ICD-10-CM

## 2015-05-13 DIAGNOSIS — H00012 Hordeolum externum right lower eyelid: Secondary | ICD-10-CM

## 2015-05-13 DIAGNOSIS — F411 Generalized anxiety disorder: Secondary | ICD-10-CM

## 2015-05-13 DIAGNOSIS — G8929 Other chronic pain: Secondary | ICD-10-CM

## 2015-05-13 DIAGNOSIS — M542 Cervicalgia: Secondary | ICD-10-CM

## 2015-05-13 MED ORDER — ONDANSETRON 8 MG PO TBDP: ORAL_TABLET | ORAL | Status: DC

## 2015-05-13 MED ORDER — LEVOTHYROXINE SODIUM 200 MCG PO TABS: 200.0000 ug | ORAL_TABLET | Freq: Every day | ORAL | Status: DC

## 2015-05-13 MED ORDER — ALPRAZOLAM 2 MG PO TABS: ORAL_TABLET | ORAL | Status: DC

## 2015-05-13 NOTE — Progress Notes (Signed)
OFFICE VISIT  05/13/2015   CC:  Chief Complaint  Patient presents with  . Follow-up    Med RF   HPI:    Patient is a 44 y.o. Caucasian female who presents for 7 mo f/u anxiety disorder with panic attacks and hypothyroidism. Anxiety: her life is hectic.  She says her sister died at [facility] hosp in [place] 2 wks ago.  Says she has been compliant with her xanax  qid, most recent dose this morning. She did not take her synthroid 200 mcg for the last 2 wks (approx) b/c of the anxiety/issues with her sister. Also, she insists she never got told to take an extra 25 mcg synthroid tab last time we checked her thyroid level and it was a bit low.  I checked with her pharmacy today to get her fill history for her thyroid meds and she apparently DID take the 25 mcg dose for June, July, and August but not after this.  She took the 200 mcg dose up until Sept or Oct this year but I then did no more RF's b/c she was instructed to come get TSH tesitng. She never came for the testing.  Essentially, I don't depend on anything she says being truthful.  Additionally, it will be very difficult to interpret her TSH today/recommend dosing of synthroid at this time.  She then starts talking about her chronic pain, got a cervical facet injection relatively recently and says after this it got infected.  She says she was treated with abx, saw Dr. Jordan Likes yesterday.  Says the CT neck done at [facility] at the time of her infection showed some dx she cannot recall the name of, but says Dr. Jordan Likes said to ask me b/c I would explain it to her ? ??  Then says she woke up this morning with mild pain in inferior eyelid/eyelash area, mild redness in the area but no swelling.  Finally, at end of visit she asked for RF of her nausea med, says she wakes up with nausea most days, taking a zofran makes it so she can eat.  W/u for this in the past has been unrevealing. I think her pain meds make her nauseous but she is not willing to stop  taking these meds.   Past Medical History  Diagnosis Date  . Tobacco dependence   . Hypothyroidism   . Cervical spinal stenosis     spondylosis  . Chronic neck pain   . Anxiety disorder     with panic  . History of cervical cancer   . Colon polyps     Pt states "colon polyp was removed when they did my hysterectomy"--need old records to veriffy.  . History of frequent urinary tract infections   . Fibromyalgia     per pt report  . Positive ANA (antinuclear antibody)   . Connective tissue disease (HCC)     Dr. Malena Catholic.  Started plaquenil 09/2014.  Marland Kitchen Chronic nausea     unknown etiology    Past Surgical History  Procedure Laterality Date  . Vaginal hysterectomy  1999    for cervical cancer per pt  . Tonsillectomy  1978  . Oophorectomy  2007    right-path neg  . Adhesions removed  2008  . Oophorectomy  2012    left  . Esophagogastroduodenoscopy  2005    mild gastritis; h pylori neg.  Says she has had esoph dilation several times  . Transthoracic echocardiogram  2004  Normal  . Holter monitor  2004    Normal  . Mri brain  2002;2004    Normal   MEDS: pt not taking plaquenil, prednisone, protonix, tessalon, estrace, or the 30 mg oxycodone tabs listed below. She reports that Dr. Jordan Likes has her on  oxycodone tabs, 1 q4h not to exceed 5 tabs in a day. Outpatient Prescriptions Prior to Visit  Medication Sig Dispense Refill  . augmented betamethasone dipropionate (DIPROLENE-AF) 0.05 % cream Apply topically 2 (two) times daily.    . Levothyroxine Sodium 25 MCG CAPS 1 tab po qd (to be taken with 200 mcg levothyroxine tab for total daily dose of 225 mcg) 30 capsule 3  . alprazolam (XANAX) 2 MG tablet 1 tab po qid prn 120 tablet 5  . levothyroxine (SYNTHROID, LEVOTHROID) 200 MCG tablet Take 1 tablet (200 mcg total) by mouth daily before breakfast. 30 tablet 1  . ondansetron (ZOFRAN-ODT) 8 MG disintegrating tablet DISSOLVE 1 TABLET UNDER TONGUE TWICE DAILY AS NEEDED FOR  NAUSEA 60 tablet 1  . benzonatate (TESSALON) 200 MG capsule Take 1 capsule (200 mg total) by mouth 3 (three) times daily as needed for cough. (Patient not taking: Reported on 05/13/2015) 30 capsule 0  . estradiol (ESTRACE) 1 MG tablet Take 1 tablet (1 mg total) by mouth daily. (Patient not taking: Reported on 05/13/2015) 30 tablet 6  . hydroxychloroquine (PLAQUENIL) 200 MG tablet Take 200 mg by mouth 2 (two) times daily. Reported on 05/13/2015    . oxycodone (ROXICODONE) 30 MG immediate release tablet 1 tab po qid prn pain (Patient not taking: Reported on 05/13/2015) 120 tablet 0  . pantoprazole (PROTONIX) 40 MG tablet Take 1 tablet (40 mg total) by mouth daily. (Patient not taking: Reported on 05/13/2015) 30 tablet 6  . predniSONE (DELTASONE) 20 MG tablet 2 tabs po qd x 5d, then 1 tab po qd x 5d (Patient not taking: Reported on 05/13/2015) 15 tablet 0   No facility-administered medications prior to visit.    Allergies  Allergen Reactions  . Aspirin Other (See Comments)    Unknown rx; this allergy was simply noted in former PCP's records    ROS As per HPI  PE: Blood pressure 114/78, pulse 73, temperature 97.4 F (36.3 C), temperature source Oral, resp. rate 16, height  (1.549 m), weight 119 lb 12 oz (54.318 kg), SpO2 100 %. Gen: Alert, well appearing.  Patient is oriented to person, place, time, and situation. ENT: R inferior eyelid pinkish hue but no swelling.  Mild TTP at base of eyelashes on right eye lower lid.  No bulbar conjunctival injection, no eye drainage. EARS: EACs clear, TMs normal. Oropharynx: no lesions, swelling, or erythema Neck - No masses or thyromegaly or limitation in range of motion CV: RRR, no m/r/g.   LUNGS: CTA bilat, nonlabored resps, good aeration in all lung fields. EXT: no clubbing, cyanosis, or edema.   LABS:  Lab Results  Component Value Date   TSH 8.98* 09/25/2014     Chemistry      Component Value Date/Time   NA 138 08/10/2014 1418   K 4.2  08/10/2014 1418   CL 103 08/10/2014 1418   CO2 25 08/10/2014 1418   BUN 10 08/10/2014 1418   CREATININE 0.83 08/10/2014 1418      Component Value Date/Time   CALCIUM 10.1 08/10/2014 1418   ALKPHOS 97 08/10/2014 1418   AST 20 08/10/2014 1418   ALT 16 08/10/2014 1418   BILITOT 0.4 08/10/2014 1418  IMPRESSION AND PLAN:  1) Chronic anxiety, hx of panic attacks.  Her life is hectic.  She has a tendency to be hard to trust regarding medical history-giving.  Urine tox screen obtained today: she last took xanax and oxycodone today.  2) Hypothyroidism: unclear compliance with thyroid med. Check TSH today.  I renewed her 200 mcg tabs today and I'll decide how to instruct her regarding dosing when I get her TSH back.  3) Chronic/recurrent nausea: related to chronic narcotic pain med use: I RF'd her zofran today.  4) Chronic pain; question of recent left sided neck infection after she got a cervical facet steroid injection. Her neck appears completely normal today. Will get CT neck report that was done at [facility] ED to try to see what the patient is referring to regarding any other diagnosis in her neck.  Along these same lines, will get Dr. Cyndia Diver most recent office visit note (which was yesterday per pt's report).  5) R inferior eyelid hordeolum: this is just beginning.  J&J baby shampoo soaks discussed/recommended.  An After Visit Summary was printed and given to the patient.  FOLLOW UP: Return in about 6 months (around 11/10/2015) for routine chronic illness f/u.

## 2015-05-13 NOTE — Progress Notes (Signed)
Pre visit review using our clinic review tool, if applicable. No additional management support is needed unless otherwise documented below in the visit note. 

## 2015-05-14 LAB — TSH: TSH: 39.87 u[IU]/mL — AB (ref 0.35–4.50)

## 2015-05-16 ENCOUNTER — Encounter: Payer: Self-pay | Admitting: Family Medicine

## 2015-05-17 ENCOUNTER — Other Ambulatory Visit: Payer: Self-pay | Admitting: *Deleted

## 2015-05-17 DIAGNOSIS — E039 Hypothyroidism, unspecified: Secondary | ICD-10-CM

## 2015-05-24 ENCOUNTER — Telehealth: Payer: Self-pay | Admitting: Family Medicine

## 2015-05-24 NOTE — Telephone Encounter (Signed)
Talked to pt on phone. Discussed recent UDS that did not show any benzo's but should have shown alprazolam. I told pt I could no longer rx her any benzo's. I discussed a weening schedule for her xanax in order for her to avoid w/drawal. I recommended antidepressant as the better tx approach to chronic anxiety w/panic attacks. She was open to this approach and agreed to schedule appt for mid March so we can recheck her TSH at the same time.

## 2015-05-25 ENCOUNTER — Telehealth: Payer: Self-pay | Admitting: *Deleted

## 2015-05-25 ENCOUNTER — Telehealth: Payer: Self-pay | Admitting: Family Medicine

## 2015-05-25 NOTE — Telephone Encounter (Signed)
Spoke to pt and she stated that she gave you the wrong day for when she had last took her alprazolam. She stated that the last day was 05/12/15 and that she took her pain med the morning of her office visit. She stated that when Dr. Milinda Cave asked her when she last took her alprazolam she told him that morning but she did not take it she confused it with her pain medication. She stated that she recently had a urine drug screen with her pain medication doctor Dr. Vevelyn Pat and her alprazolam along with her pain medication showed up then. She stated that when she came in for her office with Dr. Milinda Cave she had to go to the bathroom before she was seen. So when she had to give urine after her visit she had to drink "4 cups of water" before she could go. She stated that per a [facility] employee and another doctor this could have diluted her urine enough to where her alprazolam would not show up. Please advise. Thanks.

## 2015-05-25 NOTE — Telephone Encounter (Signed)
Sorry, but pls let pt know my decision has been made and is final.-thx

## 2015-05-25 NOTE — Telephone Encounter (Signed)
Pt LMOM on 05/25/15 at 8:27am requesting to speak to Dr. Milinda Cave. Please advise. Thanks.

## 2015-05-25 NOTE — Telephone Encounter (Signed)
Pt's UDS did not have alprazolam in it. I already talked to her and told her I won't rx any benzo's for her anymore. Will you call [facility] and cancel the remaining RF's on her alprazolam?-thx

## 2015-05-25 NOTE — Telephone Encounter (Signed)
Pt advised and voiced understanding.   

## 2015-05-25 NOTE — Telephone Encounter (Signed)
Pls call pt and get more info. I talked to her yesterday already.

## 2015-05-25 NOTE — Telephone Encounter (Signed)
Spoke to Victoria Galvan at [facility] and advised him to cancel refills on alprazolam. Refills were cancelled.

## 2015-06-10 ENCOUNTER — Encounter: Payer: Self-pay | Admitting: Family Medicine

## 2015-06-11 ENCOUNTER — Telehealth: Payer: Self-pay | Admitting: *Deleted

## 2015-06-11 MED ORDER — PAROXETINE HCL 20 MG PO TABS
ORAL_TABLET | ORAL | Status: DC
Start: 2015-06-11 — End: 2016-05-09

## 2015-06-11 NOTE — Telephone Encounter (Signed)
Pt LMOM on 06/11/15 at 8:58am requesting a call back. I spoke to pt and she stated that Dr. Milinda Cave d/c her alprazolam and since then she has had nothing but panic attacks and she "can not live like this". She is requesting that Dr. Milinda Cave call in something for there anxiety/panic attacks. Please advise. Thanks.

## 2015-06-11 NOTE — Telephone Encounter (Signed)
Pt advised and voiced understanding. Agreed to start Paxil. Rx sent to pharmacy.

## 2015-06-11 NOTE — Telephone Encounter (Signed)
No benzos. Pls eRx paxil  tabs, 1 tab po qhs, #30, RF x 1. I need to see her for f/u in 4-6 weeks if she has not already made a f/u appt.-thx

## 2015-06-14 ENCOUNTER — Telehealth: Payer: Self-pay | Admitting: *Deleted

## 2015-06-14 NOTE — Telephone Encounter (Signed)
OK, I will not give her benzo. Tell her that an antidepressant is the appropriate medication for her anxiety.  Sorry.

## 2015-06-14 NOTE — Telephone Encounter (Signed)
Pt LMOM on 06/14/15 at 9:37am requesting a call back.   Left message for pt to call back at 10:43am.

## 2015-06-14 NOTE — Telephone Encounter (Signed)
Spoke to pt and she stated that the medication Dr. Milinda Cave put her on for her anxiety is not helping. She stated that she does not want a depression medication she wants a medication for her anxiety "and if Dr. Milinda Cave can give her that then she will find a doctor that will take care of her". Please advise. Thanks.

## 2015-06-14 NOTE — Telephone Encounter (Signed)
Pt advised and stated that she will find another provider since she can not wait 4-6 weeks for this medication to work. Pt transferred to [facility] to discuss releasing her records.

## 2015-07-05 ENCOUNTER — Ambulatory Visit (HOSPITAL_COMMUNITY)
Admission: RE | Admit: 2015-07-05 | Discharge: 2015-07-05 | Disposition: A | Discharge status: Home / Self Care | Payer: BLUE CROSS/BLUE SHIELD | Source: Ambulatory Visit | Attending: Internal Medicine | Admitting: Internal Medicine

## 2015-07-05 ENCOUNTER — Other Ambulatory Visit (HOSPITAL_COMMUNITY): Payer: Self-pay | Admitting: Internal Medicine

## 2015-07-05 DIAGNOSIS — R0602 Shortness of breath: Secondary | ICD-10-CM | POA: Insufficient documentation

## 2015-07-07 ENCOUNTER — Ambulatory Visit: Payer: BLUE CROSS/BLUE SHIELD | Admitting: Family Medicine

## 2015-07-08 ENCOUNTER — Other Ambulatory Visit (HOSPITAL_COMMUNITY): Payer: Self-pay | Admitting: Respiratory Therapy

## 2015-07-08 DIAGNOSIS — R0602 Shortness of breath: Secondary | ICD-10-CM

## 2015-07-13 ENCOUNTER — Other Ambulatory Visit (HOSPITAL_COMMUNITY): Payer: Self-pay | Admitting: Respiratory Therapy

## 2015-07-16 ENCOUNTER — Encounter (HOSPITAL_COMMUNITY): Payer: BLUE CROSS/BLUE SHIELD

## 2015-07-21 ENCOUNTER — Ambulatory Visit (HOSPITAL_COMMUNITY): Admission: RE | Admit: 2015-07-21 | Payer: BLUE CROSS/BLUE SHIELD | Source: Ambulatory Visit

## 2016-05-02 ENCOUNTER — Emergency Department (HOSPITAL_COMMUNITY)
Admission: EM | Admit: 2016-05-02 | Discharge: 2016-05-03 | Disposition: A | Discharge status: Other Acute Inpatient Hospital | Payer: BLUE CROSS/BLUE SHIELD | Attending: Emergency Medicine | Admitting: Emergency Medicine

## 2016-05-02 ENCOUNTER — Encounter (HOSPITAL_COMMUNITY): Payer: Self-pay | Admitting: Emergency Medicine

## 2016-05-02 DIAGNOSIS — F17210 Nicotine dependence, cigarettes, uncomplicated: Secondary | ICD-10-CM | POA: Diagnosis not present

## 2016-05-02 DIAGNOSIS — Z79899 Other long term (current) drug therapy: Secondary | ICD-10-CM | POA: Insufficient documentation

## 2016-05-02 DIAGNOSIS — R45851 Suicidal ideations: Secondary | ICD-10-CM | POA: Diagnosis present

## 2016-05-02 DIAGNOSIS — E039 Hypothyroidism, unspecified: Secondary | ICD-10-CM | POA: Insufficient documentation

## 2016-05-02 DIAGNOSIS — F10120 Alcohol abuse with intoxication, uncomplicated: Secondary | ICD-10-CM | POA: Insufficient documentation

## 2016-05-02 DIAGNOSIS — R4689 Other symptoms and signs involving appearance and behavior: Secondary | ICD-10-CM

## 2016-05-02 DIAGNOSIS — F10920 Alcohol use, unspecified with intoxication, uncomplicated: Secondary | ICD-10-CM

## 2016-05-02 LAB — COMPREHENSIVE METABOLIC PANEL
ALK PHOS: 99 U/L (ref 38–126)
ALT: 10 U/L — AB (ref 14–54)
AST: 20 U/L (ref 15–41)
Albumin: 4.4 g/dL (ref 3.5–5.0)
Anion gap: 11 (ref 5–15)
BUN: 9 mg/dL (ref 6–20)
CALCIUM: 9 mg/dL (ref 8.9–10.3)
CHLORIDE: 107 mmol/L (ref 101–111)
CO2: 23 mmol/L (ref 22–32)
CREATININE: 0.68 mg/dL (ref 0.44–1.00)
Glucose, Bld: 102 mg/dL — ABNORMAL HIGH (ref 65–99)
Potassium: 3.4 mmol/L — ABNORMAL LOW (ref 3.5–5.1)
Sodium: 141 mmol/L (ref 135–145)
Total Bilirubin: 0.3 mg/dL (ref 0.3–1.2)
Total Protein: 7.8 g/dL (ref 6.5–8.1)

## 2016-05-02 LAB — ETHANOL: ALCOHOL ETHYL (B): 264 mg/dL — AB (ref <5)

## 2016-05-02 LAB — CBC
HCT: 39.1 % (ref 36.0–46.0)
HEMOGLOBIN: 13.8 g/dL (ref 12.0–15.0)
MCH: 31.3 pg (ref 26.0–34.0)
MCHC: 35.3 g/dL (ref 30.0–36.0)
MCV: 88.7 fL (ref 78.0–100.0)
Platelets: 315 10*3/uL (ref 150–400)
RBC: 4.41 MIL/uL (ref 3.87–5.11)
RDW: 13.1 % (ref 11.5–15.5)
WBC: 13.6 10*3/uL — ABNORMAL HIGH (ref 4.0–10.5)

## 2016-05-02 LAB — SALICYLATE LEVEL: Salicylate Lvl: <7 mg/dL (ref 2.8–30.0)

## 2016-05-02 LAB — TSH: TSH: 3.997 u[IU]/mL (ref 0.350–4.500)

## 2016-05-02 LAB — ACETAMINOPHEN LEVEL: Acetaminophen (Tylenol), Serum: <10 ug/mL — ABNORMAL LOW (ref 10–30)

## 2016-05-02 MED ORDER — LEVOTHYROXINE SODIUM 50 MCG PO TABS
25.0000 ug | ORAL_TABLET | Freq: Every day | ORAL | Status: DC
  Administered 2016-05-03: 25 ug via ORAL
  Filled 2016-05-02: qty 1

## 2016-05-02 MED ORDER — PAROXETINE HCL 20 MG PO TABS
20.0000 mg | ORAL_TABLET | Freq: Every day | ORAL | Status: DC
  Administered 2016-05-02: 20 mg via ORAL
  Filled 2016-05-02 (×3): qty 1

## 2016-05-02 MED ORDER — NICOTINE 14 MG/24HR TD PT24
14.0000 mg | MEDICATED_PATCH | Freq: Once | TRANSDERMAL | Status: DC
  Filled 2016-05-02: qty 1

## 2016-05-02 MED ORDER — LEVOTHYROXINE SODIUM 50 MCG PO TABS
200.0000 ug | ORAL_TABLET | Freq: Every day | ORAL | Status: DC
  Administered 2016-05-03: 200 ug via ORAL
  Filled 2016-05-02: qty 4

## 2016-05-02 MED ORDER — ACETAMINOPHEN 325 MG PO TABS
650.0000 mg | ORAL_TABLET | ORAL | Status: DC | PRN
Start: 2016-05-02 — End: 2016-05-03
  Administered 2016-05-03 (×2): 650 mg via ORAL
  Filled 2016-05-02 (×2): qty 2

## 2016-05-02 MED ORDER — PAROXETINE HCL 20 MG PO TABS
ORAL_TABLET | ORAL | Status: AC
  Filled 2016-05-02: qty 1

## 2016-05-02 MED ORDER — ONDANSETRON HCL 4 MG PO TABS: 4.0000 mg | ORAL_TABLET | Freq: Three times a day (TID) | ORAL | Status: DC | PRN

## 2016-05-02 MED ORDER — LORAZEPAM 1 MG PO TABS
1.0000 mg | ORAL_TABLET | Freq: Three times a day (TID) | ORAL | Status: DC | PRN
  Administered 2016-05-02 – 2016-05-03 (×2): 1 mg via ORAL
  Filled 2016-05-02 (×2): qty 1

## 2016-05-02 NOTE — ED Provider Notes (Addendum)
[facility] DEPT Provider Note   CSN: [id number] Arrival date & time: 05/02/16  2124   By signing my name below, I, Bobbie Stackhristopher Reid, attest that this documentation has been prepared under the direction and in the presence of Vanetta MuldersScott Dollene Mallery, MD. Electronically Signed: Bobbie Stackhristopher Reid, Scribe. 05/02/16. 10:29 PM. History   Chief Complaint Chief Complaint  Patient presents with  . V70.1     LEVEL 5 CAVEAT: Psychiatric disorder The history is provided by the patient. No language interpreter was used.   HPI Comments: Victoria Galvan is a 46 y.o. female brought in by police, who presents to the Emergency Department with suicidal ideas. Per officer: She was attempting to hurt herself. She had a loaded gun and was holding it to her head per officer. She states that her sister was pronounced dead a year ago today. She reports that today has been a hard day and that her husband has never lost someone that he loves. She has not taken her medications today. It is noted that she has taken a couple shots of alcohol. She has a hx of PTSD.  Past Medical History:  Diagnosis Date  . Anxiety disorder    with panic  . Cervical spinal stenosis    spondylosis  . Chronic nausea    likely from chronic narcotic pain med use  . Chronic neck pain   . Colon polyps    Pt states "colon polyp was removed when they did my hysterectomy"--need old records to veriffy.  . Connective tissue disease (HCC)    Dr. Malena CatholicHawkes--rheum.  Started plaquenil 09/2014.  . Fibromyalgia    per pt report  . History of cervical cancer   . History of frequent urinary tract infections   . Hypothyroidism    med noncompliance  . Positive ANA (antinuclear antibody)   . Tobacco dependence     Patient Active Problem List   Diagnosis Date Noted  . GAD (generalized anxiety disorder) 05/13/2015  . Myalgia 08/07/2014  . Positive ANA (antinuclear antibody) 08/07/2014  . Hypothyroidism 08/07/2014    Past Surgical History:    Procedure Laterality Date  . Adhesions removed  2008  . ESOPHAGOGASTRODUODENOSCOPY  2005   mild gastritis; h pylori neg.  Says she has had esoph dilation several times  . holter monitor  2004   Normal  . MRI brain  2002;2004   Normal  . OOPHORECTOMY  2007   right-path neg  . OOPHORECTOMY  2012   left  . TONSILLECTOMY  1978  . TRANSTHORACIC ECHOCARDIOGRAM  2004   Normal  . VAGINAL HYSTERECTOMY  1999   for cervical cancer per pt    OB History    No data available       Home Medications    Prior to Admission medications   Medication Sig Start Date End Date Taking? Authorizing Provider  alprazolam Prudy Feeler(XANAX) 2 MG tablet 1 tab po qid prn 05/13/15   Jeoffrey MassedPhilip H McGowen, MD  augmented betamethasone dipropionate (DIPROLENE-AF) 0.05 % cream Apply topically 2 (two) times daily.    Historical Provider, MD  levothyroxine (SYNTHROID, LEVOTHROID) 200 MCG tablet Take 1 tablet (200 mcg total) by mouth daily before breakfast. 05/13/15   Jeoffrey MassedPhilip H McGowen, MD  Levothyroxine Sodium 25 MCG CAPS 1 tab po qd (to be taken with 200 mcg levothyroxine tab for total daily dose of 225 mcg) 09/28/14   Jeoffrey MassedPhilip H McGowen, MD  ondansetron (ZOFRAN-ODT) 8 MG disintegrating tablet DISSOLVE 1 TABLET UNDER TONGUE TWICE DAILY AS NEEDED  FOR NAUSEA 05/13/15   Jeoffrey Massed, MD  oxyCODONE (ROXICODONE) 15 MG immediate release tablet Take 1 tablet by mouth every other day as needed. 05/12/15   Historical Provider, MD  PARoxetine (PAXIL) 20 MG tablet Take one tablet at bedtime daily 06/11/15   Jeoffrey Massed, MD    Family History Family History  Problem Relation Age of Onset  . Arthritis Mother   . Cancer Mother     Lung  . Arthritis Father     Social History Social History  Substance Use Topics  . Smoking status: Current Every Day Smoker    Packs/day: 0.50    Types: Cigarettes  . Smokeless tobacco: Never Used  . Alcohol use 0.0 oz/week     Comment: occasionally     Allergies   Aspirin   Review of  Systems Review of Systems  Unable to perform ROS: Psychiatric disorder     Physical Exam Updated Vital Signs BP 121/77 (BP Location: Left Arm)   Pulse 98   Temp 97.4 F (36.3 C) (Oral)   Resp 20   Ht 5\' 4"  (1.626 m)   Wt 56.7 kg   SpO2 97%   BMI 21.46 kg/m   Physical Exam  Constitutional: She is oriented to person, place, and time. She appears well-developed and well-nourished.  HENT:  Head: Normocephalic and atraumatic.  Mouth/Throat: Oropharynx is clear and moist.  Eyes: Conjunctivae and EOM are normal. Pupils are equal, round, and reactive to light.  Neck: Neck supple.  Cardiovascular: Normal rate and regular rhythm.   Pulmonary/Chest: Effort normal and breath sounds normal.  Abdominal: Soft. Bowel sounds are normal. There is no tenderness.  Musculoskeletal: She exhibits no tenderness.  Neurological: She is alert and oriented to person, place, and time. No cranial nerve deficit or sensory deficit. She exhibits normal muscle tone. Coordination normal.  Skin: Skin is warm.     ED Treatments / Results  DIAGNOSTIC STUDIES: Oxygen Saturation is 97% on RA, normal by my interpretation.    COORDINATION OF CARE: 10:30 PM Discussed treatment plan with pt at bedside and pt agreed to plan.  Labs (all labs ordered are listed, but only abnormal results are displayed) Labs Reviewed  COMPREHENSIVE METABOLIC PANEL - Abnormal; Notable for the following:       Result Value   Potassium 3.4 (*)    Glucose, Bld 102 (*)    ALT 10 (*)    All other components within normal limits  ETHANOL - Abnormal; Notable for the following:    Alcohol, Ethyl (B) 264 (*)    All other components within normal limits  ACETAMINOPHEN LEVEL - Abnormal; Notable for the following:    Acetaminophen (Tylenol), Serum <10 (*)    All other components within normal limits  CBC - Abnormal; Notable for the following:    WBC 13.6 (*)    All other components within normal limits  SALICYLATE LEVEL  RAPID URINE  DRUG SCREEN, HOSP PERFORMED  TSH    EKG  EKG Interpretation None       Radiology No results found.  Procedures Procedures (including critical care time)  Medications Ordered in ED Medications  nicotine (NICODERM CQ - dosed in mg/24 hours) patch 14 mg (not administered)  PARoxetine (PAXIL) tablet 20 mg (not administered)  levothyroxine (SYNTHROID, LEVOTHROID) tablet 200 mcg (not administered)  levothyroxine (SYNTHROID, LEVOTHROID) tablet 25 mcg (not administered)  LORazepam (ATIVAN) tablet 1 mg (not administered)  ondansetron (ZOFRAN) tablet 4 mg (not administered)  acetaminophen (  TYLENOL) tablet 650 mg (not administered)     Initial Impression / Assessment and Plan / ED Course  I have reviewed the triage vital signs and the nursing notes.  Pertinent labs & imaging results that were available during my care of the patient were reviewed by me and considered in my medical decision making (see chart for details).  Clinical Course    The patient brought in by police. Patient was threatening suicide. She had a loaded gun to her head. Patient stating that she lost her sister a year ago she's stressed out about this her husband does not understand. Patient with aggressive behavior to the police as well to me. Patient's Victoria Galvan has a history of posttraumatic stress disorder although not formally listed in her past medical history.  Patient placed on IVC.  Patient was elevated alcohol level however she's quite functional. Consult placed to behavioral health. Patient's medications that she is normally on ordered. Patient is also on Synthroid so TSH ordered and is pending.  Currently patient clinically cleared for behavioral health evaluation. Unless TSH is significantly abnormal.   Final Clinical Impressions(s) / ED Diagnoses   Final diagnoses:  Suicidal ideations  Aggressive behavior of adult  Alcoholic intoxication without complication (HCC)    New Prescriptions New  Prescriptions   No medications on file   I personally performed the services described in this documentation, which was scribed in my presence. The recorded information has been reviewed and is accurate.      Vanetta Mulders, MD 05/02/16 1610    Vanetta Mulders, MD 05/02/16 2310  Addendum: Thyroid-stimulating hormone results are back and it is normal. No evidence either hypo-or hyperthyroidism. Patient medically cleared. Possible that when the alcohol wears off patient may be more cooperative and story may change.   Vanetta Mulders, MD 05/02/16 2329

## 2016-05-02 NOTE — ED Notes (Signed)
When into give medication to pt, pt becoming verbally aggressive with staff. "I want to go out and smoke a cigarette."; made aware that is not allowed. Pt states "Now I'm gonna get mean". Officer at bedside.

## 2016-05-02 NOTE — ED Triage Notes (Signed)
She was trying to hurt herself per officer.  Had a loaded gun and was holding it to her head per officer.  My sister was pronounced brain dead a year ago today and 8 weeks later we lost her oldest son.  Today has been a hard day and my husband does not understand because he has never lost someone that he loves.  I promised my sister that if anything every happened to her I would take care of her children.  My sister collapsed at my daughter's apartment and my daughter did CPR on her.    Today I did not take my medications.  Patient states that she has had a couple of shots of alcohol.  I was not really going to harm myself.  I want my husband to feel what it is like to lose someone.

## 2016-05-02 NOTE — ED Notes (Addendum)
Patient states that she sees a therapist name [place]agadda in [place] for [place].

## 2016-05-02 NOTE — ED Notes (Signed)
AC to bring Paxil.

## 2016-05-02 NOTE — ED Notes (Signed)
Pt reports she was sitting at home one minute and then being escorted by police to the hospital. When asked why she was being escorted pt responds with "I don't know, whatever my estranged husband told them". Denies thoughts of hurting others but will not answer to self harm. Pt states "I just wanted to see my sister and nephew", both are deceased; denies plan. ETOH use, denies any drug or Rx medication use. Hx of depression and anxiety. Denies AVH.

## 2016-05-03 ENCOUNTER — Inpatient Hospital Stay (HOSPITAL_COMMUNITY)
Admission: AD | Admit: 2016-05-03 | Discharge: 2016-05-09 | DRG: 885 | Disposition: A | Discharge status: Home / Self Care | Payer: BLUE CROSS/BLUE SHIELD | Attending: Psychiatry | Admitting: Psychiatry

## 2016-05-03 ENCOUNTER — Encounter (HOSPITAL_COMMUNITY): Payer: Self-pay

## 2016-05-03 DIAGNOSIS — E039 Hypothyroidism, unspecified: Secondary | ICD-10-CM | POA: Diagnosis present

## 2016-05-03 DIAGNOSIS — F10120 Alcohol abuse with intoxication, uncomplicated: Secondary | ICD-10-CM | POA: Diagnosis not present

## 2016-05-03 DIAGNOSIS — M797 Fibromyalgia: Secondary | ICD-10-CM | POA: Diagnosis present

## 2016-05-03 DIAGNOSIS — Z8541 Personal history of malignant neoplasm of cervix uteri: Secondary | ICD-10-CM

## 2016-05-03 DIAGNOSIS — Z90710 Acquired absence of both cervix and uterus: Secondary | ICD-10-CM

## 2016-05-03 DIAGNOSIS — Z86010 Personal history of colonic polyps: Secondary | ICD-10-CM | POA: Diagnosis not present

## 2016-05-03 DIAGNOSIS — R251 Tremor, unspecified: Secondary | ICD-10-CM | POA: Diagnosis present

## 2016-05-03 DIAGNOSIS — R45851 Suicidal ideations: Secondary | ICD-10-CM | POA: Diagnosis present

## 2016-05-03 DIAGNOSIS — F411 Generalized anxiety disorder: Secondary | ICD-10-CM | POA: Diagnosis present

## 2016-05-03 DIAGNOSIS — Z9889 Other specified postprocedural states: Secondary | ICD-10-CM | POA: Diagnosis not present

## 2016-05-03 DIAGNOSIS — Z801 Family history of malignant neoplasm of trachea, bronchus and lung: Secondary | ICD-10-CM | POA: Diagnosis not present

## 2016-05-03 DIAGNOSIS — F333 Major depressive disorder, recurrent, severe with psychotic symptoms: Secondary | ICD-10-CM | POA: Diagnosis present

## 2016-05-03 DIAGNOSIS — F332 Major depressive disorder, recurrent severe without psychotic features: Secondary | ICD-10-CM | POA: Diagnosis present

## 2016-05-03 DIAGNOSIS — Z8261 Family history of arthritis: Secondary | ICD-10-CM | POA: Diagnosis not present

## 2016-05-03 DIAGNOSIS — M4802 Spinal stenosis, cervical region: Secondary | ICD-10-CM | POA: Diagnosis present

## 2016-05-03 DIAGNOSIS — Z79899 Other long term (current) drug therapy: Secondary | ICD-10-CM | POA: Diagnosis not present

## 2016-05-03 LAB — RAPID URINE DRUG SCREEN, HOSP PERFORMED
Amphetamines: NOT DETECTED
BENZODIAZEPINES: POSITIVE — AB
Barbiturates: NOT DETECTED
COCAINE: NOT DETECTED
Opiates: NOT DETECTED
Tetrahydrocannabinol: NOT DETECTED

## 2016-05-03 MED ORDER — CHLORDIAZEPOXIDE HCL 25 MG PO CAPS
25.0000 mg | ORAL_CAPSULE | Freq: Three times a day (TID) | ORAL | Status: AC
  Administered 2016-05-05 (×3): 25 mg via ORAL
  Filled 2016-05-03 (×3): qty 1

## 2016-05-03 MED ORDER — CHLORDIAZEPOXIDE HCL 25 MG PO CAPS
25.0000 mg | ORAL_CAPSULE | Freq: Four times a day (QID) | ORAL | Status: AC
  Administered 2016-05-04 (×3): 25 mg via ORAL
  Filled 2016-05-03 (×3): qty 1

## 2016-05-03 MED ORDER — ACETAMINOPHEN 325 MG PO TABS
650.0000 mg | ORAL_TABLET | Freq: Four times a day (QID) | ORAL | Status: DC | PRN
Start: 2016-05-03 — End: 2016-05-09
  Administered 2016-05-03 – 2016-05-08 (×11): 650 mg via ORAL
  Filled 2016-05-03 (×11): qty 2

## 2016-05-03 MED ORDER — MAGNESIUM HYDROXIDE 400 MG/5ML PO SUSP: 30.0000 mL | Freq: Every day | ORAL | Status: DC | PRN

## 2016-05-03 MED ORDER — VITAMIN B-1 100 MG PO TABS
100.0000 mg | ORAL_TABLET | Freq: Every day | ORAL | Status: DC
  Administered 2016-05-04 – 2016-05-05 (×2): 100 mg via ORAL
  Filled 2016-05-03 (×9): qty 1

## 2016-05-03 MED ORDER — HYDROXYZINE HCL 25 MG PO TABS
25.0000 mg | ORAL_TABLET | Freq: Three times a day (TID) | ORAL | Status: DC | PRN
  Administered 2016-05-04 – 2016-05-08 (×9): 25 mg via ORAL
  Filled 2016-05-03 (×10): qty 1

## 2016-05-03 MED ORDER — THIAMINE HCL 100 MG/ML IJ SOLN: 100.0000 mg | Freq: Once | INTRAMUSCULAR | Status: DC

## 2016-05-03 MED ORDER — CHLORDIAZEPOXIDE HCL 25 MG PO CAPS
25.0000 mg | ORAL_CAPSULE | Freq: Four times a day (QID) | ORAL | Status: DC | PRN
  Administered 2016-05-04 – 2016-05-06 (×4): 25 mg via ORAL
  Filled 2016-05-03 (×4): qty 1

## 2016-05-03 MED ORDER — LOPERAMIDE HCL 2 MG PO CAPS: 2.0000 mg | ORAL_CAPSULE | ORAL | Status: AC | PRN

## 2016-05-03 MED ORDER — ONDANSETRON 4 MG PO TBDP
4.0000 mg | ORAL_TABLET | Freq: Four times a day (QID) | ORAL | Status: AC | PRN
  Administered 2016-05-03 – 2016-05-05 (×3): 4 mg via ORAL
  Filled 2016-05-03 (×3): qty 1

## 2016-05-03 MED ORDER — HYDROXYZINE HCL 25 MG PO TABS
25.0000 mg | ORAL_TABLET | Freq: Four times a day (QID) | ORAL | Status: AC | PRN
  Administered 2016-05-04 – 2016-05-06 (×3): 25 mg via ORAL
  Filled 2016-05-03: qty 1

## 2016-05-03 MED ORDER — CHLORDIAZEPOXIDE HCL 25 MG PO CAPS
25.0000 mg | ORAL_CAPSULE | Freq: Once | ORAL | Status: AC
  Administered 2016-05-03: 25 mg via ORAL
  Filled 2016-05-03: qty 1

## 2016-05-03 MED ORDER — CHLORDIAZEPOXIDE HCL 25 MG PO CAPS
25.0000 mg | ORAL_CAPSULE | ORAL | Status: DC
  Administered 2016-05-06: 25 mg via ORAL
  Filled 2016-05-03: qty 1

## 2016-05-03 MED ORDER — ADULT MULTIVITAMIN W/MINERALS CH
1.0000 | ORAL_TABLET | Freq: Every day | ORAL | Status: DC
  Administered 2016-05-03 – 2016-05-05 (×3): 1 via ORAL
  Filled 2016-05-03 (×11): qty 1

## 2016-05-03 MED ORDER — CHLORDIAZEPOXIDE HCL 25 MG PO CAPS: 25.0000 mg | ORAL_CAPSULE | Freq: Every day | ORAL | Status: DC

## 2016-05-03 MED ORDER — TRAZODONE HCL 50 MG PO TABS
50.0000 mg | ORAL_TABLET | Freq: Every evening | ORAL | Status: DC | PRN
  Administered 2016-05-04 – 2016-05-07 (×5): 50 mg via ORAL
  Filled 2016-05-03 (×5): qty 1

## 2016-05-03 MED ORDER — ALUM & MAG HYDROXIDE-SIMETH 200-200-20 MG/5ML PO SUSP: 30.0000 mL | ORAL | Status: DC | PRN

## 2016-05-03 NOTE — Progress Notes (Signed)
  DATA ACTION RESPONSE  Objective- Pt. is up and visible in the room, resting in bed. Pt. presents with an irritable/flat affect and mood. Pt. appears not to remember reason for admission and was guarded/cautious with interaction. Subjective- Denies having any SI/HI/AVH at this time. Rates pain 9/10, chronic back pain. Pt. states "Can I have my xanax?"      Pt. continues to be cooperative and remain safe on the unit.   1:1 interaction in private to establish rapport. Encouragement, education, & support given from staff. Meds. ordered and administered. PRN Tylenol & Zofran requested and will re-eval accordingly.   Safety maintained with Q 15 checks. Continues to follow treatment plan and will monitor closely. No additonal questions/concerns noted.

## 2016-05-03 NOTE — ED Notes (Signed)
Dr. Campos at bedside speaking with patient

## 2016-05-03 NOTE — Progress Notes (Signed)
Pt [facility] to [facility] bed [facility], attending Dr. Jama Flavorsobos. Report # [phone].

## 2016-05-03 NOTE — Progress Notes (Signed)
Victoria Galvan is a 46 year old female being admitted involuntarily to 403-2 from [facility].  She was brought to the ED by police because she was holding a loaded gun to her head/threatening to shoot herself.  Today is the 1 year anniversary of her sister's unexpected passing and her nephew died 8 weeks later.  She was upset and reported that she started drinking but doesn't know how much she drank.  "I don't remember anything that happened."  She was agitated and verbally aggressive in the ED.  She denies regular alcohol consumption and stated "I only drank last night."  She denies any other drug usage.  She did report to RN that she takes oxycodone 10 mg qid and xanax mg qid.  She stated that she does use [facility] in [place], [place].  She denies current SI/HI or A/V hallucinations.  She was irritable during admission but did answer questions appropriately.  She reported chronic pain from her back and neck and it was 8/10.  She is diagnosed with Adjustment disorder with mixed disturbance of emotions and conduct, Alcohol use disorder, Generalized anxiety disorder and Panic disorder.  Oriented her to the unit.  Admission paperwork completed and signed.  Belongings searched and secured in locker # 42.  Skin assessment completed and no skin issues noted.  Q 15 minute checks initiated for safety.  We will monitor the progress towards her goals.

## 2016-05-03 NOTE — Plan of Care (Signed)
Problem: Safety: Goal: Periods of time without injury will increase Outcome: Progressing Pt. remains a moderate fall risk, denies SI/HI/AVH at this time, Q 15 checks in place for safety.
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
## 2016-05-03 NOTE — ED Notes (Signed)
Patient requests to speak with Dr. Patria Maneampos at this time and commented that she did not know why they would talk to her if she was drunk when she arrived.  MD aware.

## 2016-05-03 NOTE — BH Assessment (Addendum)
Tele Assessment Note   Victoria Galvan is an 46 y.o. female who was brought to the ED by LE. Pt did not cooperate or participate for this assessment. Pt was uncooperative, verbally aggressive and abusive when asked to participate. Pt's current BAL is 264 as measured in the ED tonight. Information for this assessment was obtained from pt record/history, EDP and nurse staff and observation and interaction with the pt.   Per pt record, LE was called today due to pt holding a loaded gun to her head and threatening to shoot herself. Reportedly, it is the 1 year anniversary of her sister's sudden death.  Per pt record, pt's sister collapsed in front of her and could not be saved. Per pt record, 8 weeks later, sister's oldest son also died of unstated causes. Per pt record, pt had promised to take care of her sister's children if anything happened. Per pt record, pt stated today, "I just wanted to be with my sister and nephew." Per pt record, pt later stated that she was not going to shoot herself but instead wanted her husband to understand what it felt like to lose someone.   Per record, pt sees Dr. Elsie Saas (psychiatry), Dr. Vevelyn Pat (pain specialist) and a primary care physician. Last year, in [place], pt was seeing Dr. Milinda Cave per pt record. Per Dr. Samul Dada notes at that time, there was concern over benzodiazepine use and pt was taken off benzodiazepines at that time and was started on an anti-depressant for her panic attacks/anxiety which seemed to upset pt greatly based on the documented interactions. Pt was intoxicated tonight from alcohol use. Her BAL was 264 at the time is was tested. One note in pt's hx, expressed concern over narcotic use and another over truthfulness concerning drug usage.   Pt was dressed in scrubs. Pt was alert, uncooperative, verbally and physically aggressive. Pt threw her pillow forcefully several times, lunged at the tele-assessment machine as if thinking of damaging it, voiced  profanity at her doctor, nurses and this assessor. Pt kept poor eye contact turning her back to the camera. Pt spoke in a loud, rapid, pressured tone and pace. Pt moved in a sudden, threatening, aggressive manner when moving. Pt's thought process seemed coherent and relevant and judgement was impaired.  No indication of delusional thinking or response to internal stimuli. Pt's mood was aggressive and angry and her flat, blunted affect was congruent.  Pt seemed oriented x 4, to person, place, time and situation based on limited interaction and her comments and actions.   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 Anxiety disorder    with panic  . Cervical spinal stenosis    spondylosis  . Chronic nausea    likely from chronic narcotic pain med use  . Chronic neck pain   . Colon polyps    Pt states "colon polyp was removed when they did my hysterectomy"--need old records to veriffy.  . Connective tissue disease (HCC)    Dr. Malena Catholic.  Started plaquenil 09/2014.  . Fibromyalgia    per pt report  . History of cervical cancer   . History of frequent urinary tract infections   . Hypothyroidism    med noncompliance  . Positive ANA (antinuclear antibody)   . Tobacco dependence     Past Surgical History:  Procedure Laterality Date  .  Adhesions removed  2008  . ESOPHAGOGASTRODUODENOSCOPY  2005   mild gastritis; h pylori neg.  Says she has had esoph dilation several times  . holter monitor  2004   Normal  . MRI brain  2002;2004   Normal  . OOPHORECTOMY  2007   right-path neg  . OOPHORECTOMY  2012   left  . TONSILLECTOMY  1978  . TRANSTHORACIC ECHOCARDIOGRAM  2004   Normal  . VAGINAL HYSTERECTOMY  1999   for cervical cancer per pt    Family History:  Family History  Problem Relation Age of Onset  . Arthritis Mother   . Cancer  Mother     Lung  . Arthritis Father     Social History:  reports that she has been smoking Cigarettes.  She has been smoking about 0.50 packs per day. She has never used smokeless tobacco. She reports that she drinks alcohol. She reports that she does not use drugs.  Additional Social History:  Alcohol / Drug Use Prescriptions: see MAR History of alcohol / drug use?: Yes Longest period of sobriety (when/how long): unknown Substance #1 Name of Substance 1: Alcohol 1 - Age of First Use: unknown 1 - Amount (size/oz): unknown- BAL tonight in the ED was 264 1 - Frequency: unknown 1 - Duration: ongoing 1 - Last Use / Amount: 05/01/16 Substance #2 Name of Substance 2: Tobacco/Nicotine/Cigarettes 2 - Age of First Use: unknown 2 - Amount (size/oz): 1/2 pack 2 - Frequency: daily (per pt record) 2 - Duration: ongoing 2 - Last Use / Amount: 05/01/16 Substance #3 Name of Substance 3: Benzodiazepines (possible Abuse/Dependence per pt record Jan 2017) 3 - Age of First Use: unknown 3 - Amount (size/oz): unknown 3 - Frequency: unknown 3 - Duration: unknown 3 - Last Use / Amount: unknown  CIWA: CIWA-Ar BP: 121/77 Pulse Rate: 98 COWS:    PATIENT STRENGTHS: (choose at least two) Average or above average intelligence Capable of independent living Communication skills  Allergies:  Allergies  Allergen Reactions  . Aspirin Other (See Comments)    Unknown rx; this allergy was simply noted in former PCP's records    Home Medications:  (Not in a hospital admission)  OB/GYN Status:  No LMP recorded. Patient has had a hysterectomy.  General Assessment Data Location of Assessment: [facility] TTS Assessment: In system Is this a Tele or Face-to-Face Assessment?: Tele Assessment Is this an Initial Assessment or a Re-assessment for this encounter?: Initial Assessment Marital status: Separated (per pt record) Is patient pregnant?: Unknown Pregnancy Status: Unknown Living Arrangements:  Rich Reining) Can  pt return to current living arrangement?:  Rich Reining) Admission Status: Involuntary (per Dr. Patria Mane, EDP) Is patient capable of signing voluntary admission?: Yes Referral Source: Self/Family/Friend Insurance type:  Herbalist)     Crisis Care Plan Living Arrangements:  Rich Reining) Name of Psychiatrist:  (Dr. Elsie Saas per pt record) Name of Therapist:  (none reported)  Education Status Is patient currently in school?:  (uta) Highest grade of school patient has completed:  (uta)  Risk to self with the past 6 months Suicidal Ideation: No-Not Currently/Within Last 6 Months (threatened suicide today) Has patient been a risk to self within the past 6 months prior to admission? :  (uta) Suicidal Intent: No-Not Currently/Within Last 6 Months (stated intent to shoot herself today) Has patient had any suicidal intent within the past 6 months prior to admission? :  (uta) Is patient at risk for suicide?: Yes Suicidal Plan?: No-Not Currently/Within Last 6 Months (per  LE, held loaded gun to head today & sts would shoot) Has patient had any suicidal plan within the past 6 months prior to admission? :  (uta) Access to Means: Yes Specify Access to Suicidal Means:  (has loaded gun today per pt record, per LE) What has been your use of drugs/alcohol within the last 12 months?:  (Excessive use today; BAL 264 in ED) Previous Attempts/Gestures:  Rich Reining(uta) How many times?:  (uta) Other Self Harm Risks:  (uta) Triggers for Past Attempts: None known Intentional Self Injurious Behavior:  (uta) Family Suicide History: Unknown Recent stressful life event(s): Loss (Comment) (sts today anniversary to sister's sudden death 1 y ago) Persecutory voices/beliefs?:  Rich Reining(uta) Depression:  Rich Reining(uta) Depression Symptoms:  (uta) Substance abuse history and/or treatment for substance abuse?:  (Per pt record, possibel Benzo dependence Jan 2017) Suicide prevention information given to non-admitted patients: Not applicable  Risk to Others  within the past 6 months Homicidal Ideation:  (uta) Does patient have any lifetime risk of violence toward others beyond the six months prior to admission? :  (uta-pt physically & verbally aggressive tonight) Thoughts of Harm to Others:  Rich Reining(uta) Current Homicidal Intent:  (uta) Current Homicidal Plan:  (uta) Access to Homicidal Means: Yes Describe Access to Homicidal Means:  (access to a loaded gun today per LE per pt record) Identified Victim:  (uta) History of harm to others?:  (uta) Assessment of Violence:  (physical & verbal aggression; attempt to destroy property) Violent Behavior Description:  (using profanity, refusing tx, lunging at people) Does patient have access to weapons?: Yes (Comment) Criminal Charges Pending?:  (uta) Does patient have a court date:  ([place]) Is patient on probation?:  Rich Reining(uta)  Psychosis Hallucinations:  Rich Reining(uta) Delusions:  Rich Reining(uta)  Mental Status Report Appearance/Hygiene: Disheveled, In scrubs Eye Contact: Poor Motor Activity: Agitation, Freedom of movement, Hyperactivity Speech: Aggressive, Argumentative, Logical/coherent, Rapid, Pressured, Loud, Abusive Level of Consciousness: Combative Mood: Threatening Affect: Angry, Blunted, Threatening Anxiety Level:  (uta) Thought Processes: Coherent, Relevant Judgement: Impaired Orientation: Unable to assess Obsessive Compulsive Thoughts/Behaviors: Unable to Assess  Cognitive Functioning Concentration: Unable to Assess Memory: Unable to Assess IQ:  (uta) Insight: Unable to Assess Impulse Control: Poor Appetite:  (uta) Weight Loss:  (uta) Weight Gain:  (uta) Sleep: Unable to Assess Vegetative Symptoms: Unable to Assess  ADLScreening [facility] Assessment Services) Patient's cognitive ability adequate to safely complete daily activities?: Yes Patient able to express need for assistance with ADLs?: Yes Independently performs ADLs?: Yes (appropriate for developmental age) (no barriers reported/documented)  Prior  Inpatient Therapy Prior Inpatient Therapy:  Rich Reining(uta)  Prior Outpatient Therapy Prior Outpatient Therapy:  Rich Reining(uta) Does patient have an ACCT team?: No Does patient have Intensive In-House Services?  : No Does patient have [facility] services? : Unknown Does patient have P4CC services?: Unknown  ADL Screening (condition at time of admission) Patient's cognitive ability adequate to safely complete daily activities?: Yes Patient able to express need for assistance with ADLs?: Yes Independently performs ADLs?: Yes (appropriate for developmental age) (no barriers reported/documented)       Abuse/Neglect Assessment (Assessment to be complete while patient is alone) Physical Abuse:  Rich Reining(uta) Verbal Abuse:  Rich Reining(uta) Sexual Abuse:  (uta) Exploitation of patient/patient's resources:  Rich Reining(uta) Self-Neglect:  Rich Reining(uta)     Advance Directives (For Healthcare) Does Patient Have a Medical Advance Directive?:  (uta) Would patient like information on creating a medical advance directive?:  (uta)    Additional Information 1:1 In Past 12 Months?:  (uta) CIRT Risk: Yes Elopement  Risk: Yes Does patient have medical clearance?: Yes     Disposition:  Disposition Initial Assessment Completed for this Encounter: Yes Disposition of Patient: Inpatient treatment program (Per Donell Sievert, PA) Type of inpatient treatment program: Adult  Per [facility], AC: No appropriate bed available currently. Seek outside placement.   Spoke to Dr. Patria Mane, EDP: Advised of recommendation. He sts he agrees.  Per EDP, pt is IVC'd or will be IVC'd.   Beryle Flock, MS, CRC, [facility] Triage Specialist [facility] T 05/03/2016 12:36 AM

## 2016-05-03 NOTE — [facility] Counselor (Signed)
Pt denies SI/HI. Pt denies AVH. Pt contributes SI to alcohol use. Pt states she would like to return home.  TTS will continue to seek placement.  Wolfgang PhoenixBrandi Timithy Arons, [facility] Triage Specialist

## 2016-05-03 NOTE — ED Notes (Signed)
Dr. Patria Maneampos at bedside to speak with patient.

## 2016-05-03 NOTE — ED Notes (Signed)
Meal given

## 2016-05-03 NOTE — ED Notes (Signed)
Allowing officer to leave at this time.  Security, Press photographercharge nurse, and [facility] aware.

## 2016-05-03 NOTE — ED Notes (Signed)
Patient uncooperative with counselor, cursing, not answering questions that are asked.

## 2016-05-03 NOTE — Tx Team (Signed)
Initial Treatment Plan 05/03/2016 10:41 PM Victoria Galvan Dipalma AVW:[id number]    PATIENT STRESSORS: Health problems Marital or family conflict Substance abuse   PATIENT STRENGTHS: Capable of independent living Communication skills General fund of knowledge   PATIENT IDENTIFIED PROBLEMS: Anxiety  Depression  Suicidal ideation  Substance abuse  "Galvan just want to go home"  "Never drink again in my life"           DISCHARGE CRITERIA:  Improved stabilization in mood, thinking, and/or behavior Verbal commitment to aftercare and medication compliance Withdrawal symptoms are absent or subacute and managed without 24-hour nursing intervention  PRELIMINARY DISCHARGE PLAN: Outpatient therapy Medication management  PATIENT/FAMILY INVOLVEMENT: This treatment plan has been presented to and reviewed with the patient, Victoria Galvan Robidoux.  The patient and family have been given the opportunity to ask questions and make suggestions.  Levin BaconHeather V Jenness Stemler, RN 05/03/2016, 10:41 PM

## 2016-05-03 NOTE — ED Notes (Signed)
Patient yelling out again at this time.  Dr. Patria Maneampos returned to bedside to speak with patient.

## 2016-05-03 NOTE — ED Notes (Signed)
Telepsych re-assessment in progress.  

## 2016-05-03 NOTE — ED Notes (Signed)
Spoke with Corrie DandyMary from TTS who states that due to the patient's history of holding a gun to her head that she meets inpatient criteria.  That due to her level of aggression that she meets inpatient criteria, but [facility]  They will seek placement at another facility.  Dr. Pat[facility]aneampos [facility].  Patient is IVC at this time.

## 2016-05-04 DIAGNOSIS — Z79891 Long term (current) use of opiate analgesic: Secondary | ICD-10-CM

## 2016-05-04 DIAGNOSIS — Z888 Allergy status to other drugs, medicaments and biological substances status: Secondary | ICD-10-CM

## 2016-05-04 DIAGNOSIS — R45851 Suicidal ideations: Secondary | ICD-10-CM

## 2016-05-04 MED ORDER — NICOTINE 21 MG/24HR TD PT24
21.0000 mg | MEDICATED_PATCH | Freq: Every day | TRANSDERMAL | Status: DC
  Administered 2016-05-05: 21 mg via TRANSDERMAL
  Filled 2016-05-04 (×9): qty 1

## 2016-05-04 NOTE — Progress Notes (Signed)
LCSW spoke with husband regarding information and concerns related to admission and stressors at home. Husband reports she does not regularly drink alcohol and this was not her typical behavior with drinking the [place] and shots and taking her medications.  Reports they live together and no changes in daily life or traumas affecting her at this time.  Reports patient is dealing with death of her sister and she was not dealing well that day and acted out of her norm.  Reports she is sleeping at home and taking her medications. Going to appointments and feels she was doing alright, but timing of what happened with her sister is stem of current admission.   Reports she will return home with him and he will be there to support her.  He voices no other concerns, but is available if needs arise. Reports he will be coming to see her tonight and bringing her clothing this evening.  Deretha EmoryHannah Mancel Lardizabal LCSW, MSW Clinical Social Work: Optician, dispensingystem [employer] Coverage for :  [phone]

## 2016-05-04 NOTE — Plan of Care (Signed)
Problem: Activity: Goal: Sleeping patterns will improve Outcome: Progressing Patient sleeping during the night without complaints. Patient observed snoring and moving on occasion. Respirations are even and unlabored. Patient in no distress.     
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
## 2016-05-04 NOTE — [facility] Counselor (Signed)
Adult Comprehensive Assessment  Patient ID: Victoria Galvan, female   DOB: July 17, 1970, 46 y.o.   MRN: [id number]  Information Source: Information source: Patient  Current Stressors:  Substance abuse: Minimizes alcohol intake, uses alcohol to cope and on admission patient was drinking shots of Rum Bereavement / Loss: Loss of sister one year ago and her son 8 weeks later.  Living/Environment/Situation:  Living Arrangements: Spouse/significant other Living conditions (as described by patient or guardian): Stable and loving.  patient reports good support from her husband and family members How long has patient lived in current situation?: 10 years What is atmosphere in current home: Loving, Supportive, Comfortable  Family History:  Marital status: Married Number of Years Married: 10 What types of issues is patient dealing with in the relationship?: reports she does not always communicate how she is feeling, thus internalizes her emotions regarding the loss of her family members Are you sexually active?: No Does patient have children?: Yes How many children?: 2 How is patient's relationship with their children?: Son and daughter. Very close relationship with her children: Cletis Athens and Ladona Ridgel  Childhood History:  By whom was/is the patient raised?: Both parents Description of patient's relationship with caregiver when they were a child: Positive, reports no problems as a child and good relationship with parents. Reports she was a goodie-two shoes Patient's description of current relationship with people who raised him/her: positive, remains positive How were you disciplined when you got in trouble as a child/adolescent?: normal child boundaries and discipline Does patient have siblings?: Yes Number of Siblings: 1 Description of patient's current relationship with siblings: Lost sister one year ago when her daughter found her unresponsive due to CVA.  Repors they were very close, spent time together  every day and their children were also very close. Did patient suffer any verbal/emotional/physical/sexual abuse as a child?: No Did patient suffer from severe childhood neglect?: No Has patient ever been sexually abused/assaulted/raped as an adolescent or adult?: No Was the patient ever a victim of a crime or a disaster?: No Witnessed domestic violence?: No  Education:  Highest grade of school patient has completed: High School Currently a student?: No Learning disability?: No  Employment/Work Situation:   Employment situation: Employed Where is patient currently employed?: [employer] long has patient been employed?: couple years Patient's job has been impacted by current illness: No Has patient ever been in the [employer]?: No Has patient ever served in combat?: No Did You Receive Any Psychiatric Treatment/Services While in Equities trader?: No Are There Guns or Other Weapons in Your Home?: Yes Types of Guns/Weapons: Gun, patient used her own gun and put to her head in gesture of suicide Are These Comptroller?: No Who Could Verify You Are Able To Have These Secured:: Husband Trey Paula, patient unable to disclose who has gun or where it currently is.  Financial Resources:   Financial resources: Income from employment, Income from spouse, Private insurance Does patient have a representative payee or guardian?: No  Alcohol/Substance Abuse:   What has been your use of drugs/alcohol within the last 12 months?: Alcohol  Rum on [place], shots If attempted suicide, did drugs/alcohol play a role in this?: Yes Alcohol/Substance Abuse Treatment Hx: Denies past history If yes, describe treatment: none reported Has alcohol/substance abuse ever caused legal problems?: No  Social Support System:   Patient's Community Support System: Good Describe Community Support System: family, friends, extended family Type of faith/religion: denies How does patient's faith help to cope  with current illness?: NA  Leisure/Recreation:   Leisure and Hobbies: Reports she keeps herself busy with involvement with children and work, tries to stay busy  Strengths/Needs:   What things does the patient do well?: being a mother In what areas does patient struggle / problems for patient: loss of family members, guilt on self, depression  Discharge Plan:   Does patient have access to transportation?: Yes Will patient be returning to same living situation after discharge?: Yes Currently receiving community mental health services: Yes (From Whom) ([facility]) If no, would patient like referral for services when discharged?: No Does patient have financial barriers related to discharge medications?: No  Summary/Recommendations:   Summary and Recommendations (to be completed by the evaluator): Per pt record, LE was called today due to pt holding a loaded gun to her head and threatening to shoot herself. Reportedly, it is the 1 year anniversary of her sister's sudden death.  Per pt record, pt's sister collapsed in front of her and could not be saved. Per pt record, 8 weeks later, sister's oldest son also died of unstated causes. Per pt record, pt had promised to take care of her sister's children if anything happened. Per pt record, pt stated today, "I just wanted to be with my sister and nephew." Per pt record, pt later stated that she was not going to shoot herself but instead wanted her husband to understand what it felt like to lose someone.    Recommendations:  Admit to inpatient unit for crisis stabilization under IVC due to impulsive behavior and suicidal gesture.  Review medications, attend therapeutic groups and develop safety plan for after care.  Anticipated Outcomes:  Return home with husband at discharge. Stabilize suicidal ideation as well as decrease depression.  Develop coping skills along with safe plan.    Raye SorrowCoble, Jemario Poitras N. 05/04/2016

## 2016-05-04 NOTE — [facility] Suicide Risk Assessment (Signed)
[facility] INPATIENT:  Family/Significant Other Suicide Prevention Education  Suicide Prevention Education:  Education Completed; Victoria Galvan  [phone] has been identified by the patient as the family member/significant other with whom the patient will be residing, and identified as the person(s) who will aid the patient in the event of a mental health crisis (suicidal ideations/suicide attempt).  With written consent from the patient, the family member/significant other has been provided the following suicide prevention education, prior to the and/or following the discharge of the patient.  The suicide prevention education provided includes the following:  Suicide risk factors  Suicide prevention and interventions  National Suicide Hotline telephone number  [facility] assessment telephone number  [facility] Emergency Assistance 911  [facility] and/or [facility]idential [facility] Crisis Unit telephone number  Request made of family/significant other to:  Remove weapons (e.g., guns, rifles, knives), all items previously/currently identified as safety concern.    Remove drugs/medications (over-the-counter, prescriptions, illicit drugs), all items previously/currently identified as a safety concern.  Husband reports gun was taken by police at time of 911 call, but they will get gun back. Husband will lock gun away and out of reach of patient.  The family member/significant other verbalizes understanding of the suicide prevention education information provided.  The family member/significant other agrees to remove the items of safety concern listed above.  Victoria Galvan, Victoria Galvan 05/04/2016, 11:57 AM

## 2016-05-04 NOTE — [facility] Group Notes (Signed)
[facility] 05/04/2016   1:41 PM  Type of Therapy: Mental Health Association Presentation  Participation Level: Active  Participation Quality: Attentive  Affect: Appropriate  Cognitive: Oriented  Insight: Developing/Improving  Engagement in Therapy: Engaged  Modes of Intervention: Discussion, Education and Socialization  Summary of Progress/Problems: Mental Health Association (MHA) Speaker came to talk about his personal journey with substance abuse and addiction. The pt processed ways by which to relate to the speaker. MHA speaker provided handouts and educational information pertaining to groups and services offered by the [facility]. Pt was engaged in speaker's presentation and was receptive to resources provided.  Deretha EmoryHannah Valli Randol LCSW, MSW Clinical Social Work: Optician, [employer]ystem [employer] [place]

## 2016-05-04 NOTE — Progress Notes (Signed)
Nursing Progress Note 7p-7a  D) Patient presents with flat affect and anxiety.  Patient denies SI/HI or AVH. Patient reports chronic pain of her neck. Patient complains of withdrawal symptoms and severe anxiety. Patient contracts for safety at this time.   A) Emotional support given. Patient medicated with PM orders as prescribed. Medications reviewed with patient. Patient on q15 min safety checks. Opportunities for questions or concerns presented to patient. Patient encouraged to continue to work on treatment goals.  R) Patient receptive to interaction with nurse. Patient remains safe on the unit at this time. Patient is resting in bed without complaints. Will continue to monitor.

## 2016-05-04 NOTE — Progress Notes (Signed)
D: Victoria Galvan&Ox4, denies SI/HI and AVH. Victoria isolating to room, flat affect, denies/minimizes reasons she is here. Victoria stated she is ready to go home. Victoria c/o chronic neck pain with "nothing we can offer" giving her relief as she "takes Oxy and Xanax at home". Victoria refused her nicotine patch. Victoria presents as irritable and frustrated because she can't take her pain and anxiety medications as she took them at home. Galvan: Medications given as scheduled and prn. Emotional support given as scheduled. Continue monitoring Victoria q15 minutes for safety. R: Victoria remains safe. Victoria verbalized understanding to let staff know if anything changes or if she needs anything.  Richardine Peppers, Wyman SongsterAngela Marie, RN

## 2016-05-04 NOTE — H&P (Signed)
Psychiatric Admission Assessment Adult  Patient Identification: MARCELINE NAPIERALA MRN:  [id number] Date of Evaluation:  05/04/2016 Chief Complaint:   " I mixed alcohol with my medications " Principal Diagnosis:  Suicidal ideations Diagnosis:   Patient Active Problem List   Diagnosis Date Noted  . MDD (major depressive disorder), recurrent episode, severe ([place]) [F33.2] 05/03/2016  . GAD (generalized anxiety disorder) [F41.1] 05/13/2015  . Myalgia [M79.1] 08/07/2014  . Positive ANA (antinuclear antibody) [R76.8] 08/07/2014  . Hypothyroidism [E03.9] 08/07/2014   History of Present Illness: 46 year old . States that two days ago was anniversary of sister's death ( who died a year ago) . States that she felt sad, grieving and drank heavily on that day. Thinks she blacked out, because her recollection of events is poor. Of note, denies any pattern of alcohol abuse or dependence, states she drinks once every few months. As per chart notes, police was called and she had a gun held to her head. She states " I don't remember any of it, they say I called 911 myself ".  She denies any recent significant depression or neuro-vegetative symptoms but states " this time of year is difficult for me because of her death".  Associated Signs/Symptoms: Depression Symptoms:  Denies any neuro-vegetative symptoms of depression, denies changes in sleep, appetite, energy level , denies anhedonia, describes good sense of self esteem, has not had any suicidal ideations . (Hypo) Manic Symptoms:  Denies  Anxiety Symptoms: history of panic attacks, denies agoraphobia Psychotic Symptoms:denies  PTSD Symptoms: Does not endorse current symptoms of PTSD . Total Time spent with patient: 45 minutes  Past Psychiatric History: no history of prior psychiatric admissions , denies any history of suicide attempts , denies history of self cutting , no history of severe depressive episodes, no history of mania or of psychosis.   Is the  patient at risk to self? yes Has the patient been a risk to self in the past 6 months? No.  Has the patient been a risk to self within the distant past? No.  Is the patient a risk to others? No.  Has the patient been a risk to others in the past 6 months? No.  Has the patient been a risk to others within the distant past? No.   Prior Inpatient Therapy:  no prior admissions Prior Outpatient Therapy:  Dr. Kathleene Hazel is outpatient psychiatrist, no therapist at this time  Alcohol Screening: 1. How often do you have a drink containing alcohol?: Monthly or less 2. How many drinks containing alcohol do you have on a typical day when you are drinking?: 3 or 4 3. How often do you have six or more drinks on one occasion?: Never Preliminary Score: 1 9. Have you or someone else been injured as a result of your drinking?: No 10. Has a relative or friend or a doctor or another health worker been concerned about your drinking or suggested you cut down?: No Alcohol Use Disorder Identification Test Final Score (AUDIT): 2 Brief Intervention: AUDIT score less than 7 or less-screening does not suggest unhealthy drinking-brief intervention not indicated Substance Abuse History in the last 12 months: denies history of drug abuse or dependence, denies any alcohol abuse , but states she did drink on day of admission - of note BAL was 264. Of note, reports being prescribed Xanax , 8 mgrs daily, and Oxycodone. Denies abusing these medications. Of note admission UDS is positive for BZD but negative for opiates . Consequences of Substance  Abuse: Denies previous blackouts , denies history of DUI Previous Psychotropic Medications: states she has been on Xanax x 15 years for anxiety, panic . In the past has been on Effexor XR , but not taking any longer  Psychological Evaluations: no  Past Medical History: denies any history of seizures  Past Medical History:  Diagnosis Date  . Anxiety disorder    with panic  .  Cervical spinal stenosis    spondylosis  . Chronic nausea    likely from chronic narcotic pain med use  . Chronic neck pain   . Colon polyps    Pt states "colon polyp was removed when they did my hysterectomy"--need old records to veriffy.  . Connective tissue disease ([place])    Dr. Philis Pique.  Started plaquenil 09/2014.  . Fibromyalgia    per pt report  . History of cervical cancer   . History of frequent urinary tract infections   . Hypothyroidism    med noncompliance  . Positive ANA (antinuclear antibody)   . Tobacco dependence     Past Surgical History:  Procedure Laterality Date  . Adhesions removed  2008  . ESOPHAGOGASTRODUODENOSCOPY  2005   mild gastritis; h pylori neg.  Says she has had esoph dilation several times  . holter monitor  2004   Normal  . MRI brain  2002;2004   Normal  . OOPHORECTOMY  2007   right-path neg  . OOPHORECTOMY  2012   left  . TONSILLECTOMY  1978  . TRANSTHORACIC ECHOCARDIOGRAM  2004   Normal  . VAGINAL HYSTERECTOMY  1999   for cervical cancer per pt   Family History: mother alive, father deceased, has 54 siblings, has distant relationship with mother, siblings.  Family History  Problem Relation Age of Onset  . Arthritis Mother   . Cancer Mother     Lung  . Arthritis Father    Family Psychiatric  History:  Denies psychiatric history of mental illness, denies history of suicides or substance, alcohol abuse in the family Tobacco Screening: Have you used any form of tobacco in the last 30 days? (Cigarettes, Smokeless Tobacco, Cigars, and/or Pipes): Yes Tobacco use, Select all that apply: 5 or more cigarettes per day Are you interested in Tobacco Cessation Medications?: Yes, will notify MD for an order Counseled patient on smoking cessation including recognizing danger situations, developing coping skills and basic information about quitting provided: Refused/Declined practical counseling Social History:  Married x 9 years, has 2 children,  describes good relationship with husband, children, denies legal issues, employed, major stressor is related to death of sister, who died of CVA last year. History  Alcohol Use  . 0.0 oz/week    Comment: occasionally     History  Drug Use No    Additional Social History: Marital status: Married Number of Years Married: 10 What types of issues is patient dealing with in the relationship?: reports she does not always communicate how she is feeling, thus internalizes her emotions regarding the loss of her family members Are you sexually active?: No Does patient have children?: Yes How many children?: 2 How is patient's relationship with their children?: Son and daughter. Very close relationship with her children: Wille Glaser and Lovena Le    Pain Medications: reported using Oxycodone 69m 4 times per day Prescriptions: see MAR Over the Counter: None reported History of alcohol / drug use?: Yes Longest period of sobriety (when/how long): unknown Negative Consequences of Use: Personal relationships Withdrawal Symptoms:  ("I have never had  withdrawal symptoms, I don't drink") Name of Substance 1: Alcohol 1 - Age of First Use: unknown 1 - Amount (size/oz): "3 shots is all I remember" 1 - Frequency: "I've drank 3 shots last night,  once, I don't drink" 1 - Duration: "I only drank this once" 1 - Last Use / Amount: 05/01/16 Name of Substance 2: Tobacco/Nicotine/Cigarettes 2 - Age of First Use: unknown 2 - Amount (size/oz): 1/2 pack 2 - Frequency: daily (per pt record) 2 - Duration: ongoing 2 - Last Use / Amount: 05/01/16 Name of Substance 3: Benzodiazepines (possible Abuse/Dependence per pt record Jan 2017) 3 - Age of First Use: unknown 3 - Amount (size/oz): 46m 3 - Frequency: 4 times per day 3 - Duration: unknown 3 - Last Use / Amount: yesterday              Allergies:   Allergies  Allergen Reactions  . Aspirin Other (See Comments)    Unknown rx; this allergy was simply noted in former  PCP's records   Lab Results:  Results for orders placed or performed during the hospital encounter of 05/02/16 (from the past 48 hour(s))  Comprehensive metabolic panel     Status: Abnormal   Collection Time: 05/02/16 10:23 PM  Result Value Ref Range   Sodium 141 135 - 145 mmol/L   Potassium 3.4 (L) 3.5 - 5.1 mmol/L   Chloride 107 101 - 111 mmol/L   CO2 23 22 - 32 mmol/L   Glucose, Bld 102 (H) 65 - 99 mg/dL   BUN 9 6 - 20 mg/dL   Creatinine, Ser 0.68 0.44 - 1.00 mg/dL   Calcium 9.0 8.9 - 10.3 mg/dL   Total Protein 7.8 6.5 - 8.1 g/dL   Albumin 4.4 3.5 - 5.0 g/dL   AST 20 15 - 41 U/L   ALT 10 (L) 14 - 54 U/L   Alkaline Phosphatase 99 38 - 126 U/L   Total Bilirubin 0.3 0.3 - 1.2 mg/dL   GFR calc non Af Amer >60 >60 mL/min   GFR calc Af Amer >60 >60 mL/min    Comment: (NOTE) The eGFR has been calculated using the CKD EPI equation. This calculation has not been validated in all clinical situations. eGFR's persistently <60 mL/min signify possible Chronic Kidney Disease.    Anion gap 11 5 - 15  Ethanol     Status: Abnormal   Collection Time: 05/02/16 10:23 PM  Result Value Ref Range   Alcohol, Ethyl (B) 264 (H) <5 mg/dL    Comment:        LOWEST DETECTABLE LIMIT FOR SERUM ALCOHOL IS 5 mg/dL FOR MEDICAL PURPOSES ONLY   Salicylate level     Status: None   Collection Time: 05/02/16 10:23 PM  Result Value Ref Range   Salicylate Lvl <<6.72.8 - 30.0 mg/dL  Acetaminophen level     Status: Abnormal   Collection Time: 05/02/16 10:23 PM  Result Value Ref Range   Acetaminophen (Tylenol), Serum <10 (L) 10 - 30 ug/mL    Comment:        THERAPEUTIC CONCENTRATIONS VARY SIGNIFICANTLY. A RANGE OF 10-30 ug/mL MAY BE AN EFFECTIVE CONCENTRATION FOR MANY PATIENTS. HOWEVER, SOME ARE BEST TREATED AT CONCENTRATIONS OUTSIDE THIS RANGE. ACETAMINOPHEN CONCENTRATIONS >150 ug/mL AT 4 HOURS AFTER INGESTION AND >50 ug/mL AT 12 HOURS AFTER INGESTION ARE OFTEN ASSOCIATED WITH TOXIC REACTIONS.    cbc     Status: Abnormal   Collection Time: 05/02/16 10:23 PM  Result Value Ref Range  WBC 13.6 (H) 4.0 - 10.5 K/uL   RBC 4.41 3.87 - 5.11 MIL/uL   Hemoglobin 13.8 12.0 - 15.0 g/dL   HCT 39.1 36.0 - 46.0 %   MCV 88.7 78.0 - 100.0 fL   MCH 31.3 26.0 - 34.0 pg   MCHC 35.3 30.0 - 36.0 g/dL   RDW 13.1 11.5 - 15.5 %   Platelets 315 150 - 400 K/uL  TSH     Status: None   Collection Time: 05/02/16 10:23 PM  Result Value Ref Range   TSH 3.997 0.350 - 4.500 uIU/mL    Comment: Performed by a 3rd Generation assay with a functional sensitivity of <=0.01 uIU/mL.  Rapid urine drug screen (hospital performed)     Status: Abnormal   Collection Time: 05/03/16  5:45 AM  Result Value Ref Range   Opiates NONE DETECTED NONE DETECTED   Cocaine NONE DETECTED NONE DETECTED   Benzodiazepines POSITIVE (A) NONE DETECTED   Amphetamines NONE DETECTED NONE DETECTED   Tetrahydrocannabinol NONE DETECTED NONE DETECTED   Barbiturates NONE DETECTED NONE DETECTED    Comment:        DRUG SCREEN FOR MEDICAL PURPOSES ONLY.  IF CONFIRMATION IS NEEDED FOR ANY PURPOSE, NOTIFY LAB WITHIN 5 DAYS.        LOWEST DETECTABLE LIMITS FOR URINE DRUG SCREEN Drug Class       Cutoff (ng/mL) Amphetamine      1000 Barbiturate      200 Benzodiazepine   354 Tricyclics       656 Opiates          300 Cocaine          300 THC              50     Blood Alcohol level:  Lab Results  Component Value Date   ETH 264 (H) 81/27/5170    Metabolic Disorder Labs:  No results found for: HGBA1C, MPG No results found for: PROLACTIN No results found for: CHOL, TRIG, HDL, CHOLHDL, VLDL, LDLCALC  Current Medications: Current Facility-Administered Medications  Medication Dose Route Frequency Provider Last Rate Last Dose  . acetaminophen (TYLENOL) tablet 650 mg  650 mg Oral Q6H PRN Nanci Pina, FNP   650 mg at 05/04/16 0830  . alum & mag hydroxide-simeth (MAALOX/MYLANTA) 200-200-20 MG/5ML suspension 30 mL  30 mL Oral Q4H PRN  Nanci Pina, FNP      . chlordiazePOXIDE (LIBRIUM) capsule 25 mg  25 mg Oral QID Nanci Pina, FNP   25 mg at 05/04/16 1203   Followed by  . [START ON 05/05/2016] chlordiazePOXIDE (LIBRIUM) capsule 25 mg  25 mg Oral TID Nanci Pina, FNP       Followed by  . [START ON 05/06/2016] chlordiazePOXIDE (LIBRIUM) capsule 25 mg  25 mg Oral BH-qamhs Nanci Pina, FNP       Followed by  . [START ON 05/07/2016] chlordiazePOXIDE (LIBRIUM) capsule 25 mg  25 mg Oral Daily Nanci Pina, FNP      . chlordiazePOXIDE (LIBRIUM) capsule 25 mg  25 mg Oral Q6H PRN Nanci Pina, FNP      . hydrOXYzine (ATARAX/VISTARIL) tablet 25 mg  25 mg Oral TID PRN Nanci Pina, FNP   25 mg at 05/04/16 1049  . hydrOXYzine (ATARAX/VISTARIL) tablet 25 mg  25 mg Oral Q6H PRN Nanci Pina, FNP      . loperamide (IMODIUM) capsule 2-4 mg  2-4 mg Oral PRN Gayland Curry  Starkes, FNP      . magnesium hydroxide (MILK OF MAGNESIA) suspension 30 mL  30 mL Oral Daily PRN Nanci Pina, FNP      . multivitamin with minerals tablet 1 tablet  1 tablet Oral Daily Nanci Pina, FNP   1 tablet at 05/04/16 0827  . nicotine (NICODERM CQ - dosed in mg/24 hours) patch 21 mg  21 mg Transdermal Daily Myer Peer Cobos, MD      . ondansetron (ZOFRAN-ODT) disintegrating tablet 4 mg  4 mg Oral Q6H PRN Nanci Pina, FNP   4 mg at 05/04/16 1203  . thiamine (B-1) injection 100 mg  100 mg Intramuscular Once Nanci Pina, FNP      . thiamine (VITAMIN B-1) tablet 100 mg  100 mg Oral Daily Nanci Pina, FNP   100 mg at 05/04/16 0827  . traZODone (DESYREL) tablet 50 mg  50 mg Oral QHS PRN Nanci Pina, FNP   50 mg at 05/04/16 0139   PTA Medications: Prescriptions Prior to Admission  Medication Sig Dispense Refill Last Dose  . alprazolam (XANAX) 2 MG tablet 1 tab po qid prn (Patient taking differently: Take 1 mg by mouth 4 (four) times daily as needed for anxiety. 1 tab po qid prn) 120 tablet 5 05/02/2016 at Unknown time  . gabapentin  (NEURONTIN) 300 MG capsule Take 300 mg by mouth 3 (three) times daily.    05/02/2016 at Unknown time  . levothyroxine (SYNTHROID, LEVOTHROID) 50 MCG tablet Take 50 mcg by mouth daily before breakfast.   0 05/03/2016 at Unknown time  . Levothyroxine Sodium 25 MCG CAPS 1 tab po qd (to be taken with 200 mcg levothyroxine tab for total daily dose of 225 mcg) (Patient not taking: Reported on 05/03/2016) 30 capsule 3 Not Taking at Unknown time  . ondansetron (ZOFRAN-ODT) 8 MG disintegrating tablet DISSOLVE 1 TABLET UNDER TONGUE TWICE DAILY AS NEEDED FOR NAUSEA 60 tablet 3 05/02/2016 at Unknown time  . Oxycodone HCl 10 MG TABS Take 10 mg by mouth every 6 (six) hours as needed (for pain).   0 05/02/2016 at Unknown time  . PARoxetine (PAXIL) 20 MG tablet Take one tablet at bedtime daily (Patient not taking: Reported on 05/03/2016) 30 tablet 1 Not Taking at Unknown time    Musculoskeletal: Strength & Muscle Tone: within normal limits Gait & Station: normal Patient leans: N/A  Psychiatric Specialty Exam: Physical Exam  ROS  Blood pressure 110/63, pulse 90, temperature 99.1 F (37.3 C), temperature source Oral, resp. rate 18, height '5\' 1"'  (1.549 m), weight 55.8 kg (123 lb).Body mass index is 23.24 kg/m.  General Appearance: Fairly Groomed  Eye Contact:  Good  Speech:  Normal Rate  Volume:  Normal  Mood:  denies feeling depressed, minimizes sadness or depression at this time  Affect:  mildly constricted, anxious  Thought Process:  Linear  Orientation:  Full (Time, Place, and Person)  Thought Content:  no hallucinations, no delusions, not internally preoccupied   Suicidal Thoughts:  No denies any suicidal or self injurious ideations  Homicidal Thoughts:  No denies any homicidal ideations  Memory:  recent and remote grossly intact   Judgement:  Fair  Insight:  Fair  Psychomotor Activity:  Normal no distal tremors, no diaphoresis  Concentration:  Concentration: Good and Attention Span: Good  Recall:  Good   Fund of Knowledge:  Good  Language:  Good  Akathisia:  Negative  Handed:  Right  AIMS (if indicated):  Assets:  Desire for Improvement Resilience  ADL's:  Intact  Cognition:  WNL  Sleep:  Number of Hours: 6.25    Treatment Plan Summary: Daily contact with patient to assess and evaluate symptoms and progress in treatment, Medication management, Plan inpatient admission  and medications as below  Observation Level/Precautions:  15 minute checks  Laboratory:  as needed   Psychotherapy:  [facility], group therapy   Medications:  We discussed options - patient expresses some ambivalence and apprehension about BZD detox, but has insight regarding abuse, overdose potential, and states " I do want to come off Benzos". Continue Librium detox protocol to minimize risk of WDL Trazodone PRNs for insomnia as needed  Tylenol PRN for pain as needed  Patient not presenting with any opiate WDL, and admission UDS negative for opiates, states last took opiate analgesic 2-3 days prior to admission. 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, Suicidal Ideations Long Term Goal(s): Improvement in symptoms so as ready for discharge  Short Term Goals: Ability to verbalize feelings will improve, Ability to disclose and discuss suicidal ideas, Ability to demonstrate self-control will improve, Ability to identify and develop effective coping behaviors will improve and Ability to maintain clinical measurements within normal limits will improve  Physician Treatment Plan for Secondary Diagnosis: Active Problems:   MDD (major depressive disorder), recurrent episode, severe ([place])  Long Term Goal(s): Improvement in symptoms so as ready for discharge  Short Term Goals: Ability to  verbalize feelings will improve, Ability to disclose and discuss suicidal ideas, Ability to demonstrate self-control will improve, Ability to identify and develop effective coping behaviors will improve and Ability to maintain clinical measurements within normal limits will improve  I certify that inpatient services furnished can reasonably be expected to improve the patient's condition.    Neita Garnet, MD 1/11/20181:15 PM

## 2016-05-05 LAB — LIPID PANEL
CHOLESTEROL: 198 mg/dL (ref 0–200)
HDL: 51 mg/dL (ref >40)
LDL Cholesterol: 129 mg/dL — ABNORMAL HIGH (ref 0–99)
TRIGLYCERIDES: 92 mg/dL (ref <150)
Total CHOL/HDL Ratio: 3.9 RATIO
VLDL: 18 mg/dL (ref 0–40)

## 2016-05-05 LAB — TSH: TSH: 4.877 u[IU]/mL — ABNORMAL HIGH (ref 0.350–4.500)

## 2016-05-05 NOTE — Tx Team (Signed)
Interdisciplinary Treatment and Diagnostic Plan Update  05/05/2016 Time of Session: 5:34 PM  Victoria Galvan MRN: [id number]  Principal Diagnosis: Suicidal ideations  Secondary Diagnoses: Principal Problem:   Suicidal ideations Active Problems:   MDD (major depressive disorder), recurrent episode, severe (HCC)   Current Medications:  Current Facility-Administered Medications  Medication Dose Route Frequency Provider Last Rate Last Dose  . acetaminophen (TYLENOL) tablet 650 mg  650 mg Oral Q6H PRN Nanci Pina, FNP   650 mg at 05/05/16 1649  . alum & mag hydroxide-simeth (MAALOX/MYLANTA) 200-200-20 MG/5ML suspension 30 mL  30 mL Oral Q4H PRN Nanci Pina, FNP      . [START ON 05/06/2016] chlordiazePOXIDE (LIBRIUM) capsule 25 mg  25 mg Oral BH-qamhs Nanci Pina, FNP       Followed by  . [START ON 05/07/2016] chlordiazePOXIDE (LIBRIUM) capsule 25 mg  25 mg Oral Daily Nanci Pina, FNP      . chlordiazePOXIDE (LIBRIUM) capsule 25 mg  25 mg Oral Q6H PRN Nanci Pina, FNP   25 mg at 05/05/16 3818  . hydrOXYzine (ATARAX/VISTARIL) tablet 25 mg  25 mg Oral TID PRN Nanci Pina, FNP   25 mg at 05/05/16 1353  . hydrOXYzine (ATARAX/VISTARIL) tablet 25 mg  25 mg Oral Q6H PRN Nanci Pina, FNP   25 mg at 05/04/16 2006  . loperamide (IMODIUM) capsule 2-4 mg  2-4 mg Oral PRN Nanci Pina, FNP      . magnesium hydroxide (MILK OF MAGNESIA) suspension 30 mL  30 mL Oral Daily PRN Nanci Pina, FNP      . multivitamin with minerals tablet 1 tablet  1 tablet Oral Daily Nanci Pina, FNP   1 tablet at 05/05/16 0756  . nicotine (NICODERM CQ - dosed in mg/24 hours) patch 21 mg  21 mg Transdermal Daily Jenne Campus, MD   21 mg at 05/05/16 0755  . ondansetron (ZOFRAN-ODT) disintegrating tablet 4 mg  4 mg Oral Q6H PRN Nanci Pina, FNP   4 mg at 05/05/16 0800  . thiamine (B-1) injection 100 mg  100 mg Intramuscular Once Nanci Pina, FNP      . thiamine (VITAMIN B-1) tablet 100  mg  100 mg Oral Daily Nanci Pina, FNP   100 mg at 05/05/16 0756  . traZODone (DESYREL) tablet 50 mg  50 mg Oral QHS PRN Nanci Pina, FNP   50 mg at 05/04/16 2112    PTA Medications: Prescriptions Prior to Admission  Medication Sig Dispense Refill Last Dose  . alprazolam (XANAX) 2 MG tablet 1 tab po qid prn (Patient taking differently: Take 1 mg by mouth 4 (four) times daily as needed for anxiety. 1 tab po qid prn) 120 tablet 5 05/02/2016 at Unknown time  . gabapentin (NEURONTIN) 300 MG capsule Take 300 mg by mouth 3 (three) times daily.    05/02/2016 at Unknown time  . levothyroxine (SYNTHROID, LEVOTHROID) 50 MCG tablet Take 50 mcg by mouth daily before breakfast.   0 05/03/2016 at Unknown time  . Levothyroxine Sodium 25 MCG CAPS 1 tab po qd (to be taken with 200 mcg levothyroxine tab for total daily dose of 225 mcg) (Patient not taking: Reported on 05/03/2016) 30 capsule 3 Not Taking at Unknown time  . ondansetron (ZOFRAN-ODT) 8 MG disintegrating tablet DISSOLVE 1 TABLET UNDER TONGUE TWICE DAILY AS NEEDED FOR NAUSEA 60 tablet 3 05/02/2016 at Unknown time  . Oxycodone HCl 10 MG  TABS Take 10 mg by mouth every 6 (six) hours as needed (for pain).   0 05/02/2016 at Unknown time  . PARoxetine (PAXIL) 20 MG tablet Take one tablet at bedtime daily (Patient not taking: Reported on 05/03/2016) 30 tablet 1 Not Taking at Unknown time    Treatment Modalities: Medication Management, Group therapy, Case management,  1 to 1 session with clinician, Psychoeducation, Recreational therapy.  Patient Stressors: Health problems Marital or family conflict Substance abuse  Patient Strengths: Capable of independent living Curator fund of knowledge  Physician Treatment Plan for Primary Diagnosis: Suicidal ideations Long Term Goal(s): Improvement in symptoms so as ready for discharge  Short Term Goals: Ability to verbalize feelings will improve Ability to disclose and discuss suicidal  ideas Ability to demonstrate self-control will improve Ability to identify and develop effective coping behaviors will improve Ability to maintain clinical measurements within normal limits will improve Ability to verbalize feelings will improve Ability to disclose and discuss suicidal ideas Ability to demonstrate self-control will improve Ability to identify and develop effective coping behaviors will improve Ability to maintain clinical measurements within normal limits will improve  Medication Management: Evaluate patient's response, side effects, and tolerance of medication regimen.  Therapeutic Interventions: 1 to 1 sessions, Unit Group sessions and Medication administration.  Evaluation of Outcomes: Not Met  Physician Treatment Plan for Secondary Diagnosis: Principal Problem:   Suicidal ideations Active Problems:   MDD (major depressive disorder), recurrent episode, severe ([place])   Long Term Goal(s): Improvement in symptoms so as ready for discharge  Short Term Goals: Ability to verbalize feelings will improve Ability to disclose and discuss suicidal ideas Ability to demonstrate self-control will improve Ability to identify and develop effective coping behaviors will improve Ability to maintain clinical measurements within normal limits will improve Ability to verbalize feelings will improve Ability to disclose and discuss suicidal ideas Ability to demonstrate self-control will improve Ability to identify and develop effective coping behaviors will improve Ability to maintain clinical measurements within normal limits will improve  Medication Management: Evaluate patient's response, side effects, and tolerance of medication regimen.  Therapeutic Interventions: 1 to 1 sessions, Unit Group sessions and Medication administration.  Evaluation of Outcomes: Not Met   RN Treatment Plan for Primary Diagnosis: Suicidal ideations Long Term Goal(s): Knowledge of disease and  therapeutic regimen to maintain health will improve  Short Term Goals: Ability to verbalize feelings will improve, Ability to disclose and discuss suicidal ideas and Ability to identify and develop effective coping behaviors will improve  Medication Management: RN will administer medications as ordered by provider, will assess and evaluate patient's response and provide education to patient for prescribed medication. RN will report any adverse and/or side effects to prescribing provider.  Therapeutic Interventions: 1 on 1 counseling sessions, Psychoeducation, Medication administration, Evaluate responses to treatment, Monitor vital signs and CBGs as ordered, Perform/monitor CIWA, COWS, AIMS and Fall Risk screenings as ordered, Perform wound care treatments as ordered.  Evaluation of Outcomes: Not Met   LCSW Treatment Plan for Primary Diagnosis: Suicidal ideations Long Term Goal(s): Safe transition to appropriate next level of care at discharge, Engage patient in therapeutic group addressing interpersonal concerns.  Short Term Goals: Engage patient in aftercare planning with referrals and resources, Identify triggers associated with mental health/substance abuse issues and Increase skills for wellness and recovery  Therapeutic Interventions: Assess for all discharge needs, 1 to 1 time with Social worker, Explore available resources and support systems, Assess for adequacy in community support network,  Educate family and significant other(s) on suicide prevention, Complete Psychosocial Assessment, Interpersonal group therapy.  Evaluation of Outcomes: Not Met   Progress in Treatment: Attending groups: Yes  Participating in groups: Yes  Taking medication as prescribed: Yes, MD continues to assess for medication changes as needed Toleration medication: Yes, no side effects reported at this time Family/Significant other contact made: No, CSW attempting to make contact with husband Patient  understands diagnosis: Continuing to assess Discussing patient identified problems/goals with staff: Yes Medical problems stabilized or resolved: Yes Denies suicidal/homicidal ideation: Yes Issues/concerns per patient self-inventory: None Other: N/A  New problem(s) identified: None identified at this time.   New Short Term/Long Term Goal(s): None identified at this time.   Discharge Plan or Barriers: Pt will return home and follow-up with outpatient services.   Reason for Continuation of Hospitalization: Anxiety Depression Medication stabilization  Estimated Length of Stay: 2-4 days  Attendees: Patient: 05/05/2016  5:34 PM  Physician: Dr. Parke Poisson 05/05/2016  5:34 PM  Nursing: Desma Paganini, RN; Sandre Kitty, RN 05/05/2016  5:34 PM  RN Care Manager: Lars Pinks, RN 05/05/2016  5:34 PM  Social Worker: Adriana Reams, LCSW; [place], LCSW 05/05/2016  5:34 PM  Recreational Therapist:  05/05/2016  5:34 PM  Other: Lindell Spar, NP; Priscille Loveless, NP 05/05/2016  5:34 PM  Other:  05/05/2016  5:34 PM  Other: 05/05/2016  5:34 PM    Scribe for Treatment Team: Gladstone Lighter, LCSW 05/05/2016 5:34 PM

## 2016-05-05 NOTE — Plan of Care (Signed)
Problem: Safety: Goal: Ability to remain free from injury will improve Outcome: Progressing No self injurious behavior observed or expressed  Problem: Self-Concept: Goal: Level of anxiety will decrease Outcome: Not Progressing Pt continues to report high anxiety and rates as a 7 on 0-10 scale  Problem: Medication: Goal: Compliance with prescribed medication regimen will improve Outcome: Progressing Pt is taking medications as prescribed

## 2016-05-05 NOTE — Progress Notes (Signed)
Adult Psychoeducational Group Note  Date:  05/05/2016 Time:  8:00 PM  Group Topic/Focus:  Wrap-Up Group:   The focus of this group is to help patients review their daily goal of treatment and discuss progress on daily workbooks.   Participation Level:  Active  Participation Quality:  Attentive  Affect:  Appropriate  Cognitive:  Alert  Insight: Good  Engagement in Group:  Engaged  Modes of Intervention:  Discussion and Education  Additional Comments:  Patient reported working on functioning a making it through the day.   Elmore GuiseSLOAN, Shawntae Lowy N 05/05/2016, 10:05 PM

## 2016-05-05 NOTE — Progress Notes (Signed)
[facility] MD Progress Note  05/05/2016 1:40 PM Victoria Galvan  MRN:  [id number] Subjective:  Patient reports she is feeling better. She describes subjective feeling of being jittery , restless, but states that in general " I am feeling much better than I thought I was going to be feeling " ( referring to BZD detox/ possible WDL symptoms). She reports some ongoing anxiety. She states her mood is better today. Objective : I have discussed case with treatment team and have met with patient. Patient is currently not in any acute distress or discomfort, vitals are stable, there is not diaphoresis, and no significant distal tremors. She denies any visual disturbances. She is visible on unit, going to some groups, no agitated or disruptive behaviors. She has reported being on Xanax for " years", she has denied any pattern of alcohol abuse , and stated that recent intoxication was an isolated episode . She has expressed desire to taper off /detox off BZDs as she is aware of addictive and WDL potential and expresses insight into the role that BZDs played in her recent decompensation/black out episode. Denies any suicidal ideations, minimizes depressive symptoms at this time. Principal Problem: Suicidal ideations Diagnosis:   Patient Active Problem List   Diagnosis Date Noted  . Suicidal ideations [R45.851]   . MDD (major depressive disorder), recurrent episode, severe ([place]) [F33.2] 05/03/2016  . GAD (generalized anxiety disorder) [F41.1] 05/13/2015  . Myalgia [M79.1] 08/07/2014  . Positive ANA (antinuclear antibody) [R76.8] 08/07/2014  . Hypothyroidism [E03.9] 08/07/2014   Total Time spent with patient: 20 minutes   Past Medical History:  Past Medical History:  Diagnosis Date  . Anxiety disorder    with panic  . Cervical spinal stenosis    spondylosis  . Chronic nausea    likely from chronic narcotic pain med use  . Chronic neck pain   . Colon polyps    Pt states "colon polyp was removed when they  did my hysterectomy"--need old records to veriffy.  . Connective tissue disease ([place])    Dr. Philis Pique.  Started plaquenil 09/2014.  . Fibromyalgia    per pt report  . History of cervical cancer   . History of frequent urinary tract infections   . Hypothyroidism    med noncompliance  . Positive ANA (antinuclear antibody)   . Tobacco dependence     Past Surgical History:  Procedure Laterality Date  . Adhesions removed  2008  . ESOPHAGOGASTRODUODENOSCOPY  2005   mild gastritis; h pylori neg.  Says she has had esoph dilation several times  . holter monitor  2004   Normal  . MRI brain  2002;2004   Normal  . OOPHORECTOMY  2007   right-path neg  . OOPHORECTOMY  2012   left  . TONSILLECTOMY  1978  . TRANSTHORACIC ECHOCARDIOGRAM  2004   Normal  . VAGINAL HYSTERECTOMY  1999   for cervical cancer per pt   Family History:  Family History  Problem Relation Age of Onset  . Arthritis Mother   . Cancer Mother     Lung  . Arthritis Father    Social History:  History  Alcohol Use  . 0.0 oz/week    Comment: occasionally     History  Drug Use No    Social History   Social History  . Marital status: Married    Spouse name: N/A  . Number of children: 2  . Years of education: College   Occupational History  . CNA Unemployed  Social History Main Topics  . Smoking status: Current Every Day Smoker    Packs/day: 0.50    Types: Cigarettes  . Smokeless tobacco: Never Used  . Alcohol use 0.0 oz/week     Comment: occasionally  . Drug use: No  . Sexual activity: Yes    Birth control/ protection: Surgical   Other Topics Concern  . None   Social History Narrative   Married.   Education: GED, Psychologist, occupational.   Occupation: unemployed, formerly worked as a Quarry manager.   Additional Social History:    Pain Medications: reported using Oxycodone 32m 4 times per day Prescriptions: see MAR Over the Counter: None reported History of alcohol / drug use?: Yes Longest period of  sobriety (when/how long): unknown Negative Consequences of Use: Personal relationships Withdrawal Symptoms:  ("I have never had withdrawal symptoms, I don't drink") Name of Substance 1: Alcohol 1 - Age of First Use: unknown 1 - Amount (size/oz): "3 shots is all I remember" 1 - Frequency: "I've drank 3 shots last night,  once, I don't drink" 1 - Duration: "I only drank this once" 1 - Last Use / Amount: 05/01/16 Name of Substance 2: Tobacco/Nicotine/Cigarettes 2 - Age of First Use: unknown 2 - Amount (size/oz): 1/2 pack 2 - Frequency: daily (per pt record) 2 - Duration: ongoing 2 - Last Use / Amount: 05/01/16 Name of Substance 3: Benzodiazepines (possible Abuse/Dependence per pt record Jan 2017) 3 - Age of First Use: unknown 3 - Amount (size/oz): 219m3 - Frequency: 4 times per day 3 - Duration: unknown 3 - Last Use / Amount: yesterday  Sleep: Fair- improving   Appetite:  Good  Current Medications: Current Facility-Administered Medications  Medication Dose Route Frequency Provider Last Rate Last Dose  . acetaminophen (TYLENOL) tablet 650 mg  650 mg Oral Q6H PRN TaNanci PinaFNP   650 mg at 05/05/16 0800  . alum & mag hydroxide-simeth (MAALOX/MYLANTA) 200-200-20 MG/5ML suspension 30 mL  30 mL Oral Q4H PRN TaNanci PinaFNP      . chlordiazePOXIDE (LIBRIUM) capsule 25 mg  25 mg Oral TID TaNanci PinaFNP   25 mg at 05/05/16 1129   Followed by  . [START ON 05/06/2016] chlordiazePOXIDE (LIBRIUM) capsule 25 mg  25 mg Oral BH-qamhs TaNanci PinaFNP       Followed by  . [START ON 05/07/2016] chlordiazePOXIDE (LIBRIUM) capsule 25 mg  25 mg Oral Daily TaNanci PinaFNP      . chlordiazePOXIDE (LIBRIUM) capsule 25 mg  25 mg Oral Q6H PRN TaNanci PinaFNP   25 mg at 05/05/16 067371. hydrOXYzine (ATARAX/VISTARIL) tablet 25 mg  25 mg Oral TID PRN TaNanci PinaFNP   25 mg at 05/04/16 1658  . hydrOXYzine (ATARAX/VISTARIL) tablet 25 mg  25 mg Oral Q6H PRN TaNanci PinaFNP    25 mg at 05/04/16 2006  . loperamide (IMODIUM) capsule 2-4 mg  2-4 mg Oral PRN TaNanci PinaFNP      . magnesium hydroxide (MILK OF MAGNESIA) suspension 30 mL  30 mL Oral Daily PRN TaNanci PinaFNP      . multivitamin with minerals tablet 1 tablet  1 tablet Oral Daily TaNanci PinaFNP   1 tablet at 05/05/16 0756  . nicotine (NICODERM CQ - dosed in mg/24 hours) patch 21 mg  21 mg Transdermal Daily FeJenne CampusMD   21 mg at 05/05/16 0755  .  ondansetron (ZOFRAN-ODT) disintegrating tablet 4 mg  4 mg Oral Q6H PRN Nanci Pina, FNP   4 mg at 05/05/16 0800  . thiamine (B-1) injection 100 mg  100 mg Intramuscular Once Nanci Pina, FNP      . thiamine (VITAMIN B-1) tablet 100 mg  100 mg Oral Daily Nanci Pina, FNP   100 mg at 05/05/16 0756  . traZODone (DESYREL) tablet 50 mg  50 mg Oral QHS PRN Nanci Pina, FNP   50 mg at 05/04/16 2112    Lab Results:  Results for orders placed or performed during the hospital encounter of 05/03/16 (from the past 48 hour(s))  Lipid panel     Status: Abnormal   Collection Time: 05/05/16  6:20 AM  Result Value Ref Range   Cholesterol 198 0 - 200 mg/dL   Triglycerides 92 <150 mg/dL   HDL 51 >40 mg/dL   Total CHOL/HDL Ratio 3.9 RATIO   VLDL 18 0 - 40 mg/dL   LDL Cholesterol 129 (H) 0 - 99 mg/dL    Comment:        Total Cholesterol/HDL:CHD Risk Coronary Heart Disease Risk Table                     Men   Women  1/2 Average Risk   3.4   3.3  Average Risk       5.0   4.4  2 X Average Risk   9.6   7.1  3 X Average Risk  23.4   11.0        Use the calculated Patient Ratio above and the CHD Risk Table to determine the patient's CHD Risk.        ATP III CLASSIFICATION (LDL):  <100     mg/dL   Optimal  100-129  mg/dL   Near or Above                    Optimal  130-159  mg/dL   Borderline  160-189  mg/dL   High  >190     mg/dL   Very High Performed at [facility]   TSH     Status: Abnormal   Collection Time: 05/05/16   6:20 AM  Result Value Ref Range   TSH 4.877 (H) 0.350 - 4.500 uIU/mL    Comment: Performed by a 3rd Generation assay with a functional sensitivity of <=0.01 uIU/mL. Performed at [facility]     Blood Alcohol level:  Lab Results  Component Value Date   ETH 264 (H) 69/62/9528    Metabolic Disorder Labs: No results found for: HGBA1C, MPG No results found for: PROLACTIN Lab Results  Component Value Date   CHOL 198 05/05/2016   TRIG 92 05/05/2016   HDL 51 05/05/2016   CHOLHDL 3.9 05/05/2016   VLDL 18 05/05/2016   LDLCALC 129 (H) 05/05/2016    Physical Findings: AIMS: Facial and Oral Movements Muscles of Facial Expression: None, normal Lips and Perioral Area: None, normal Jaw: None, normal Tongue: None, normal,Extremity Movements Upper (arms, wrists, hands, fingers): None, normal Lower (legs, knees, ankles, toes): None, normal, Trunk Movements Neck, shoulders, hips: None, normal, Overall Severity Severity of abnormal movements (highest score from questions above): None, normal Incapacitation due to abnormal movements: None, normal Patient's awareness of abnormal movements (rate only patient's report): No Awareness, Dental Status Current problems with teeth and/or dentures?: No Does patient usually wear dentures?: No  CIWA:  CIWA-Ar Total: 9 COWS:  COWS Total Score: 4  Musculoskeletal: Strength & Muscle Tone: within normal limits Gait & Station: normal Patient leans: N/A  Psychiatric Specialty Exam: Physical Exam  ROS no visual disturbances, no shortness of breath, no chest pain, no vomiting   Blood pressure 105/72, pulse 72, temperature 99.1 F (37.3 C), temperature source Oral, resp. rate 16, height '5\' 1"'  (1.549 m), weight 55.8 kg (123 lb).Body mass index is 23.24 kg/m.  General Appearance: improved grooming   Eye Contact:  Good  Speech:  Normal Rate  Volume:  Normal  Mood:  improving , feeling better   Affect:  reactive, less anxious   Thought Process:  Linear  Orientation:  Full (Time, Place, and Person)  Thought Content:  no hallucinations, no delusions, not internally preoccupied  Suicidal Thoughts:  No at this time denies any suicidal or self injurious ideations, denies any homicidal or violent ideations   Homicidal Thoughts:  No  Memory:  recent and remote grossly intact  Judgement:  Other:  improving   Insight:  improving   Psychomotor Activity:  Normal  Concentration:  Concentration: Good and Attention Span: Good  Recall:  Good  Fund of Knowledge:  Good  Language:  Good  Akathisia:  Negative  Handed:  Right  AIMS (if indicated):     Assets:  Desire for Improvement Resilience  ADL's:  Intact  Cognition:  WNL  Sleep:  Number of Hours: 6.25   Assessment - patient is presenting with improving mood . Denies any SI at this time. Continues to describe recent episode as occurring in the context of BZD/Alcohol related blackout and states she was not feeling depressed or suicidal prior to event. She has expressed interest in tapering off BZDs, which she has been on for several years. Patient had also been on Opiate Analgesics prior to admission, but is not presenting with or endorsing any opiate WDL symptoms at this time, and states pain is manageable at this time- does not appear in any acute distress or discomfort- vitals stable .   Treatment Plan Summary: Daily contact with patient to assess and evaluate symptoms and progress in treatment, Medication management, Plan inpatient admission and medications as below Encourage ongoing group and milieu participation to work on coping skills and symptom reduction Treatment team working on disposition planning options  Continue Librium detox protocol to minimize risk of BZD WDL . Continue Vistaril 25 mgrs Q 6 hours PRN for anxiety   Rein Popov, Felicita Gage, MD 05/05/2016, 1:40 PM

## 2016-05-05 NOTE — Progress Notes (Signed)
Patient ID: Victoria Galvan, female   DOB: [birth year], 46 y.o.   MRN: [id number]  D: Patient alert and cooperative. Pt reports detoxing from xanax c/o tremors, nausea, headache and anxiety. Pt mood/affect is anxious and sad. Pt denies SI/HI/AVH. No acute distressed noted at this time.   A: Medications administered as prescribed. Emotional support given and will continue to monitor pt's progress for stabilization.  R: Patient remains safe and complaint with medications.

## 2016-05-05 NOTE — Progress Notes (Signed)
Recreation Therapy Notes  Date: 05/05/16 Time: 0930 Location: 300 Hall Dayroom  Group Topic: Stress Management  Goal Area(s) Addresses:  Patient will verbalize importance of using healthy stress management.  Patient will identify positive emotions associated with healthy stress management.   Intervention: Stress Management  Activity :  Choice Meditation.  LRT introduced the stress management technique of meditation.  LRT played a meditation focused on choices to allow patients the opportunity to engage in the activity.  Patients were to follow along with the meditation as it played.  Education:  Stress Management, Discharge Planning.   Education Outcome: Acknowledges edcuation/In group clarification offered/Needs additional education  Clinical Observations/Feedback: Pt did not attend group.    Caroll RancherMarjette Mandalyn [place], LRT/CTRS         Caroll RancherLindsay, Lashay Osborne A 05/05/2016 12:33 PM

## 2016-05-05 NOTE — Progress Notes (Signed)
D:Pt has brief eye contact and rates anxiety as a 7 on 0-10 scale with 10 being the most. Pt c/o neck pain and nausea this morning and received prn medication. Pt is talking about wanting to go home.  A:Offered support, encouragement and 15 minute checks. R:Pt denies si and hi. Safety maintained on the unit.

## 2016-05-06 LAB — HEMOGLOBIN A1C
HEMOGLOBIN A1C: 5.2 % (ref 4.8–5.6)
MEAN PLASMA GLUCOSE: 103 mg/dL

## 2016-05-06 MED ORDER — GABAPENTIN 100 MG PO CAPS
200.0000 mg | ORAL_CAPSULE | Freq: Three times a day (TID) | ORAL | Status: DC
  Administered 2016-05-07: 200 mg via ORAL
  Filled 2016-05-06 (×6): qty 2

## 2016-05-06 MED ORDER — CHLORDIAZEPOXIDE HCL 25 MG PO CAPS
25.0000 mg | ORAL_CAPSULE | Freq: Four times a day (QID) | ORAL | Status: AC
  Administered 2016-05-06 (×2): 25 mg via ORAL
  Filled 2016-05-06 (×2): qty 1

## 2016-05-06 MED ORDER — DULOXETINE HCL 20 MG PO CPEP
20.0000 mg | ORAL_CAPSULE | Freq: Every day | ORAL | Status: DC
  Administered 2016-05-06 – 2016-05-09 (×4): 20 mg via ORAL
  Filled 2016-05-06 (×6): qty 1

## 2016-05-06 MED ORDER — CHLORDIAZEPOXIDE HCL 25 MG PO CAPS
25.0000 mg | ORAL_CAPSULE | Freq: Three times a day (TID) | ORAL | Status: AC
  Administered 2016-05-07 (×3): 25 mg via ORAL
  Filled 2016-05-06 (×3): qty 1

## 2016-05-06 MED ORDER — GABAPENTIN 100 MG PO CAPS
100.0000 mg | ORAL_CAPSULE | Freq: Three times a day (TID) | ORAL | Status: DC
  Administered 2016-05-06: 100 mg via ORAL
  Filled 2016-05-06 (×8): qty 1

## 2016-05-06 MED ORDER — CHLORDIAZEPOXIDE HCL 25 MG PO CAPS
25.0000 mg | ORAL_CAPSULE | ORAL | Status: AC
  Administered 2016-05-08 (×2): 25 mg via ORAL
  Filled 2016-05-06 (×2): qty 1

## 2016-05-06 MED ORDER — CHLORDIAZEPOXIDE HCL 25 MG PO CAPS
25.0000 mg | ORAL_CAPSULE | Freq: Every day | ORAL | Status: AC
  Administered 2016-05-09: 25 mg via ORAL
  Filled 2016-05-06: qty 1

## 2016-05-06 NOTE — [facility] Group Notes (Signed)
Adult Therapy Group Note  Date:  05/06/2016 Time:  10:00-11:00AM Group Topic/Focus: Compare and contrast patients' current "I am...." statements to the visions patients identified as desirable for their lives one year from now.  An emphasis was placed on how negative self-talk significantly affects how we think, which influences how we behave. For a few minutes, patients were paired up to discuss what "I am..." statements they are hopeful they are making a year from now.   Many expressions of similarities and mutual support were provided among group members.    Patients were left with the task of observing themselves for the day as to any "I am...." statements they make.  Participation Level:  Active  Participation Quality:  Attentive, Sharing and Supportive  Affect:  Blunted and Depressed  Cognitive:  Appropriate  Insight: Improving  Engagement in Group:  Developing  Modes of Intervention:  Activity, Discussion and Support  Additional Comments:  Pt was curled up in a blanket in her chair, and spoke very little.  However, she did nod her head a good deal in reaction to other patients' statements.  Later in group, she turned to look out the window and became more detached.  Victoria JaegerMareida J Galvan 05/06/2016, 1:00 PM

## 2016-05-06 NOTE — [facility] Group Notes (Signed)
Life SKills  Date:  05/06/2016  Time:  1100  Type of Therapy:  Nurse Education  /  Life SKills : The group focuses on teaching patients how to identify their needs and then how to develop skills needed to get them met.   Participation Level:  Active  Participation Quality:  Attentive  Affect:  Anxious  Cognitive:  Alert  Insight:  Limited  Engagement in Group:  Engaged  Modes of Intervention:  Education  Summary of Progress/Problems:  Lauralyn Primes 05/06/2016, 5:09 PM

## 2016-05-06 NOTE — Progress Notes (Signed)
The patient attended the entire group but refused to share.

## 2016-05-06 NOTE — Progress Notes (Signed)
[facility] MD Progress Note  05/06/2016 1:53 PM Victoria Galvan  MRN:  [id number] Subjective:  Patient reports ongoing improvement compared to admission . She has been apprehensive about tapering off BZDs , as she has been on them for several years, but states that thus far detox/taper has been " surprisingly mild". She feels BZDs have helped her anxiety , but is motivated in detoxing, as she is aware of addictive potential and about the negative impact BZDs played in her recent blackout and decompensation. Minimizes depression, denies any suicidal ideations . Objective : I have reviewed chart notes and have met with patient. As above, patient is not currently endorsing any severe BZD withdrawal symptoms. She does report some nausea and a subjective sense of being " jittery", but presents calm, in no acute distress, no visual disturbances, no stuttering, no headache, no tremors, no diaphoresis, and vitals are stable. She has been going to groups and behavior on unit has been calm and in good control. Mood is improved and at this time denies significant neuro-vegetative symptoms or any suicidal ideations.  Principal Problem: Suicidal ideations Diagnosis:   Patient Active Problem List   Diagnosis Date Noted  . Suicidal ideations [R45.851]   . MDD (major depressive disorder), recurrent episode, severe ([place]) [F33.2] 05/03/2016  . GAD (generalized anxiety disorder) [F41.1] 05/13/2015  . Myalgia [M79.1] 08/07/2014  . Positive ANA (antinuclear antibody) [R76.8] 08/07/2014  . Hypothyroidism [E03.9] 08/07/2014   Total Time spent with patient: 20 minutes   Past Medical History:  Past Medical History:  Diagnosis Date  . Anxiety disorder    with panic  . Cervical spinal stenosis    spondylosis  . Chronic nausea    likely from chronic narcotic pain med use  . Chronic neck pain   . Colon polyps    Pt states "colon polyp was removed when they did my hysterectomy"--need old records to veriffy.  .  Connective tissue disease ([place])    Dr. Philis Pique.  Started plaquenil 09/2014.  . Fibromyalgia    per pt report  . History of cervical cancer   . History of frequent urinary tract infections   . Hypothyroidism    med noncompliance  . Positive ANA (antinuclear antibody)   . Tobacco dependence     Past Surgical History:  Procedure Laterality Date  . Adhesions removed  2008  . ESOPHAGOGASTRODUODENOSCOPY  2005   mild gastritis; h pylori neg.  Says she has had esoph dilation several times  . holter monitor  2004   Normal  . MRI brain  2002;2004   Normal  . OOPHORECTOMY  2007   right-path neg  . OOPHORECTOMY  2012   left  . TONSILLECTOMY  1978  . TRANSTHORACIC ECHOCARDIOGRAM  2004   Normal  . VAGINAL HYSTERECTOMY  1999   for cervical cancer per pt   Family History:  Family History  Problem Relation Age of Onset  . Arthritis Mother   . Cancer Mother     Lung  . Arthritis Father    Social History:  History  Alcohol Use  . 0.0 oz/week    Comment: occasionally     History  Drug Use No    Social History   Social History  . Marital status: Married    Spouse name: N/A  . Number of children: 2  . Years of education: College   Occupational History  . CNA Unemployed   Social History Main Topics  . Smoking status: Current Every Day Smoker  Packs/day: 0.50    Types: Cigarettes  . Smokeless tobacco: Never Used  . Alcohol use 0.0 oz/week     Comment: occasionally  . Drug use: No  . Sexual activity: Yes    Birth control/ protection: Surgical   Other Topics Concern  . None   Social History Narrative   Married.   Education: GED, Psychologist, occupational.   Occupation: unemployed, formerly worked as a Quarry manager.   Additional Social History:    Pain Medications: reported using Oxycodone 45m 4 times per day Prescriptions: see MAR Over the Counter: None reported History of alcohol / drug use?: Yes Longest period of sobriety (when/how long): unknown Negative Consequences of  Use: Personal relationships Withdrawal Symptoms:  ("I have never had withdrawal symptoms, I don't drink") Name of Substance 1: Alcohol 1 - Age of First Use: unknown 1 - Amount (size/oz): "3 shots is all I remember" 1 - Frequency: "I've drank 3 shots last night,  once, I don't drink" 1 - Duration: "I only drank this once" 1 - Last Use / Amount: 05/01/16 Name of Substance 2: Tobacco/Nicotine/Cigarettes 2 - Age of First Use: unknown 2 - Amount (size/oz): 1/2 pack 2 - Frequency: daily (per pt record) 2 - Duration: ongoing 2 - Last Use / Amount: 05/01/16 Name of Substance 3: Benzodiazepines (possible Abuse/Dependence per pt record Jan 2017) 3 - Age of First Use: unknown 3 - Amount (size/oz): 217m3 - Frequency: 4 times per day 3 - Duration: unknown 3 - Last Use / Amount: yesterday  Sleep:  improving   Appetite:  Good  Current Medications: Current Facility-Administered Medications  Medication Dose Route Frequency Provider Last Rate Last Dose  . acetaminophen (TYLENOL) tablet 650 mg  650 mg Oral Q6H PRN TaNanci PinaFNP   650 mg at 05/06/16 [id number]. alum & mag hydroxide-simeth (MAALOX/MYLANTA) 200-200-20 MG/5ML suspension 30 mL  30 mL Oral Q4H PRN TaNanci PinaFNP      . chlordiazePOXIDE (LIBRIUM) capsule 25 mg  25 mg Oral BH-qamhs TaNanci PinaFNP   25 mg at 05/06/16 0810   Followed by  . [START ON 05/07/2016] chlordiazePOXIDE (LIBRIUM) capsule 25 mg  25 mg Oral Daily TaNanci PinaFNP      . chlordiazePOXIDE (LIBRIUM) capsule 25 mg  25 mg Oral Q6H PRN TaNanci PinaFNP   25 mg at 05/06/16 1107  . DULoxetine (CYMBALTA) DR capsule 20 mg  20 mg Oral Daily FeMyer Peerobos, MD      . gabapentin (NEURONTIN) capsule 100 mg  100 mg Oral TID FeJenne CampusMD      . hydrOXYzine (ATARAX/VISTARIL) tablet 25 mg  25 mg Oral TID PRN TaNanci PinaFNP   25 mg at 05/05/16 1353  . hydrOXYzine (ATARAX/VISTARIL) tablet 25 mg  25 mg Oral Q6H PRN TaNanci PinaFNP   25 mg at 05/06/16  0640  . loperamide (IMODIUM) capsule 2-4 mg  2-4 mg Oral PRN TaNanci PinaFNP      . magnesium hydroxide (MILK OF MAGNESIA) suspension 30 mL  30 mL Oral Daily PRN TaNanci PinaFNP      . multivitamin with minerals tablet 1 tablet  1 tablet Oral Daily TaNanci PinaFNP   1 tablet at 05/05/16 0756  . nicotine (NICODERM CQ - dosed in mg/24 hours) patch 21 mg  21 mg Transdermal Daily FeJenne CampusMD   21 mg at 05/05/16 0755  . ondansetron (  ZOFRAN-ODT) disintegrating tablet 4 mg  4 mg Oral Q6H PRN Nanci Pina, FNP   4 mg at 05/05/16 0800  . thiamine (B-1) injection 100 mg  100 mg Intramuscular Once Nanci Pina, FNP      . thiamine (VITAMIN B-1) tablet 100 mg  100 mg Oral Daily Nanci Pina, FNP   100 mg at 05/05/16 0756  . traZODone (DESYREL) tablet 50 mg  50 mg Oral QHS PRN Nanci Pina, FNP   50 mg at 05/05/16 2122    Lab Results:  Results for orders placed or performed during the hospital encounter of 05/03/16 (from the past 48 hour(s))  Hemoglobin A1c     Status: None   Collection Time: 05/05/16  6:20 AM  Result Value Ref Range   Hgb A1c MFr Bld 5.2 4.8 - 5.6 %    Comment: (NOTE)         Pre-diabetes: 5.7 - 6.4         Diabetes: >6.4         Glycemic control for adults with diabetes: <7.0    Mean Plasma Glucose 103 mg/dL    Comment: (NOTE) Performed At: [facility] [place], [place] [id number] Lindon Romp MD QV:[id number] Performed at [facility]   Lipid panel     Status: Abnormal   Collection Time: 05/05/16  6:20 AM  Result Value Ref Range   Cholesterol 198 0 - 200 mg/dL   Triglycerides 92 <150 mg/dL   HDL 51 >40 mg/dL   Total CHOL/HDL Ratio 3.9 RATIO   VLDL 18 0 - 40 mg/dL   LDL Cholesterol 129 (H) 0 - 99 mg/dL    Comment:        Total Cholesterol/HDL:CHD Risk Coronary Heart Disease Risk Table                     Men   Women  1/2 Average Risk   3.4   3.3  Average Risk       5.0   4.4  2 X Average  Risk   9.6   7.1  3 X Average Risk  23.4   11.0        Use the calculated Patient Ratio above and the CHD Risk Table to determine the patient's CHD Risk.        ATP III CLASSIFICATION (LDL):  <100     mg/dL   Optimal  100-129  mg/dL   Near or Above                    Optimal  130-159  mg/dL   Borderline  160-189  mg/dL   High  >190     mg/dL   Very High Performed at [facility]   TSH     Status: Abnormal   Collection Time: 05/05/16  6:20 AM  Result Value Ref Range   TSH 4.877 (H) 0.350 - 4.500 uIU/mL    Comment: Performed by a 3rd Generation assay with a functional sensitivity of <=0.01 uIU/mL. Performed at [facility]     Blood Alcohol level:  Lab Results  Component Value Date   ETH 264 (H) 32/95/1884    Metabolic Disorder Labs: Lab Results  Component Value Date   HGBA1C 5.2 05/05/2016   MPG 103 05/05/2016   No results found for: PROLACTIN Lab Results  Component Value Date   CHOL 198 05/05/2016   TRIG  92 05/05/2016   HDL 51 05/05/2016   CHOLHDL 3.9 05/05/2016   VLDL 18 05/05/2016   LDLCALC 129 (H) 05/05/2016    Physical Findings: AIMS: Facial and Oral Movements Muscles of Facial Expression: None, normal Lips and Perioral Area: None, normal Jaw: None, normal Tongue: None, normal,Extremity Movements Upper (arms, wrists, hands, fingers): None, normal Lower (legs, knees, ankles, toes): None, normal, Trunk Movements Neck, shoulders, hips: None, normal, Overall Severity Severity of abnormal movements (highest score from questions above): None, normal Incapacitation due to abnormal movements: None, normal Patient's awareness of abnormal movements (rate only patient's report): No Awareness, Dental Status Current problems with teeth and/or dentures?: No Does patient usually wear dentures?: No  CIWA:  CIWA-Ar Total: 2 COWS:  COWS Total Score: 4  Musculoskeletal: Strength & Muscle Tone: within normal limits Gait & Station:  normal Patient leans: N/A  Psychiatric Specialty Exam: Physical Exam  ROS no visual disturbances, no shortness of breath, no chest pain, no vomiting   Blood pressure 103/81, pulse 93, temperature 97.7 F (36.5 C), temperature source Oral, resp. rate 16, height _0  (1.549 m), weight 55.8 kg (123 lb).Body mass index is 23.24 kg/m.  General Appearance: improved grooming   Eye Contact:  Good  Speech:  Normal Rate  Volume:  Normal  Mood:  Has improved since admission, at this time does not endorse severe depression  Affect:   Remains vaguely anxious, but affect reactive, smiles at times appropriately   Thought Process:  Linear  Orientation:  Full (Time, Place, and Person)  Thought Content:  no hallucinations, no delusions, not internally preoccupied  Suicidal Thoughts:  No at this time denies any suicidal or self injurious ideations, denies any homicidal or violent ideations   Homicidal Thoughts:  No  Memory:  recent and remote grossly intact  Judgement:  Other:  improving   Insight:  improving   Psychomotor Activity:  Normal  Concentration:  Concentration: Good and Attention Span: Good  Recall:  Good  Fund of Knowledge:  Good  Language:  Good  Akathisia:  Negative  Handed:  Right  AIMS (if indicated):     Assets:  Desire for Improvement Resilience  ADL's:  Intact  Cognition:  WNL  Sleep:  Number of Hours: 6.75   Assessment -patient is presenting improved compared to admission - she has denied severe depression or severe neuro-vegetative symptoms of depression, has denied any suicidal ideations, and has expressed ongoing interest and motivation in tapering off /detoxing off BZDs following her recent BZD/alcohol induced blackout. She is not currently presenting with symptoms of WDL . Vitals are stable . As patient does endorse history of depression, anxiety, and some chronic pain will start Cymbalta trial- we reviewed side effects and rationale.    Treatment Plan Summary: Daily  contact with patient to assess and evaluate symptoms and progress in treatment, Medication management, Plan inpatient admission and medications as below Encourage ongoing group and milieu participation to work on coping skills and symptom reduction Treatment team working on disposition planning options  Continue Librium detox protocol to minimize risk of BZD WDL . Continue Vistaril 25 mgrs Q 6 hours PRN for anxiety Start Cymbalta 20 mgrs QDAY for depression, anxiety, chronic pain Restart Neurontin 100 mgrs TID initially for anxiety and chronic pain    Victoria Garnet, MD 05/06/2016, 1:53 PM   Patient ID: Victoria Galvan, female   DOB: 1971/04/09, 46 y.o.   MRN: [id number]

## 2016-05-06 NOTE — [facility] Group Notes (Signed)
Goals group   Date:  05/06/2016  Time:  0930  Type of Therapy:  Nurse Education /  Goals group:  The group is focused on teaching patients how to set attainable daily goals that will help them in their walk to recovery.  Participation Level:  Active  Participation Quality:  Appropriate  Affect:  Anxious  Cognitive:  Oriented  Insight:  Improving  Engagement in Group:  Engaged  Modes of Intervention:  Education  Summary of Progress/Problems:  Rich BraveDuke, Milanni Ayub Lynn 05/06/2016, 2:46 PM

## 2016-05-06 NOTE — Progress Notes (Signed)
D Carley Hammedva has spent much of the day..sitting in the dayroom with a bed blanket wrapped tightly around her shoulders. She speaks very softly, shares that she is " very hard of hearing and spends  A lot of my time reading people's lips". A She completed her daily assessment and on it she wrote she deneid SI and she rated her depression, hopelessness and anxiety " 0/0/6", respectively. She has had periods of anxiety, agitation and general discomfort and she found herslef very uncomfortable thisa fternoon around 1600. She begged this nurse to speak to Dr. Jama Flavorsobos abut having her neurontin increased ( she says she was taking 300 mg tid prior to admission) and he increased it to 200 mg tid. He also modified her current librium protocol to restart QID day today ( this was done ) and she received 3rd dose and noted significant decrease in anxiety after taking librium. R Safety in place. To continue to encourage pt to force fluids, rest, attend groups .

## 2016-05-07 MED ORDER — GABAPENTIN 300 MG PO CAPS
300.0000 mg | ORAL_CAPSULE | Freq: Three times a day (TID) | ORAL | Status: DC
  Administered 2016-05-07 – 2016-05-08 (×4): 300 mg via ORAL
  Filled 2016-05-07 (×9): qty 1

## 2016-05-07 NOTE — Progress Notes (Signed)
[facility] MD Progress Note  05/07/2016 12:51 PM Victoria Galvan  MRN:  [id number] Subjective:  She describes increased subjective symptoms of WDL today- feels " on edge", vaguely irritable, restless. She states she remains motivated and committed to tapering off BZDs . She denies medication side effects. Objective : I have reviewed chart notes and have met with patient. Patient presents vaguely anxious, but affect is improved compared to admission , more reactive. As above, reports subjective symptoms of WDL/Anxiety, but vitals remain stable, does not present psycho-motorically restless or agitated, no diaphoresis. Subtle distal tremors. Patient has been on benzodiazepines for anxiety for years, and states she wants to taper off  BZDs .  Denies suicidal ideations, no psychotic symptoms. Visible in day room, going to some groups.  Principal Problem: Suicidal ideations Diagnosis:   Patient Active Problem List   Diagnosis Date Noted  . Suicidal ideations [R45.851]   . MDD (major depressive disorder), recurrent episode, severe ([place]) [F33.2] 05/03/2016  . GAD (generalized anxiety disorder) [F41.1] 05/13/2015  . Myalgia [M79.1] 08/07/2014  . Positive ANA (antinuclear antibody) [R76.8] 08/07/2014  . Hypothyroidism [E03.9] 08/07/2014   Total Time spent with patient: 20 minutes   Past Medical History:  Past Medical History:  Diagnosis Date  . Anxiety disorder    with panic  . Cervical spinal stenosis    spondylosis  . Chronic nausea    likely from chronic narcotic pain med use  . Chronic neck pain   . Colon polyps    Pt states "colon polyp was removed when they did my hysterectomy"--need old records to veriffy.  . Connective tissue disease ([place])    Dr. Philis Pique.  Started plaquenil 09/2014.  . Fibromyalgia    per pt report  . History of cervical cancer   . History of frequent urinary tract infections   . Hypothyroidism    med noncompliance  . Positive ANA (antinuclear antibody)   .  Tobacco dependence     Past Surgical History:  Procedure Laterality Date  . Adhesions removed  2008  . ESOPHAGOGASTRODUODENOSCOPY  2005   mild gastritis; h pylori neg.  Says she has had esoph dilation several times  . holter monitor  2004   Normal  . MRI brain  2002;2004   Normal  . OOPHORECTOMY  2007   right-path neg  . OOPHORECTOMY  2012   left  . TONSILLECTOMY  1978  . TRANSTHORACIC ECHOCARDIOGRAM  2004   Normal  . VAGINAL HYSTERECTOMY  1999   for cervical cancer per pt   Family History:  Family History  Problem Relation Age of Onset  . Arthritis Mother   . Cancer Mother     Lung  . Arthritis Father    Social History:  History  Alcohol Use  . 0.0 oz/week    Comment: occasionally     History  Drug Use No    Social History   Social History  . Marital status: Married    Spouse name: N/A  . Number of children: 2  . Years of education: College   Occupational History  . CNA Unemployed   Social History Main Topics  . Smoking status: Current Every Day Smoker    Packs/day: 0.50    Types: Cigarettes  . Smokeless tobacco: Never Used  . Alcohol use 0.0 oz/week     Comment: occasionally  . Drug use: No  . Sexual activity: Yes    Birth control/ protection: Surgical   Other Topics Concern  . None  Social History Narrative   Married.   Education: GED, Psychologist, occupational.   Occupation: unemployed, formerly worked as a Quarry manager.   Additional Social History:    Pain Medications: reported using Oxycodone 51m 4 times per day Prescriptions: see MAR Over the Counter: None reported History of alcohol / drug use?: Yes Longest period of sobriety (when/how long): unknown Negative Consequences of Use: Personal relationships Withdrawal Symptoms:  ("I have never had withdrawal symptoms, I don't drink") Name of Substance 1: Alcohol 1 - Age of First Use: unknown 1 - Amount (size/oz): "3 shots is all I remember" 1 - Frequency: "I've drank 3 shots last night,  once, I don't  drink" 1 - Duration: "I only drank this once" 1 - Last Use / Amount: 05/01/16 Name of Substance 2: Tobacco/Nicotine/Cigarettes 2 - Age of First Use: unknown 2 - Amount (size/oz): 1/2 pack 2 - Frequency: daily (per pt record) 2 - Duration: ongoing 2 - Last Use / Amount: 05/01/16 Name of Substance 3: Benzodiazepines (possible Abuse/Dependence per pt record Jan 2017) 3 - Age of First Use: unknown 3 - Amount (size/oz): 248m3 - Frequency: 4 times per day 3 - Duration: unknown 3 - Last Use / Amount: yesterday  Sleep:  improving   Appetite:  Good  Current Medications: Current Facility-Administered Medications  Medication Dose Route Frequency Provider Last Rate Last Dose  . acetaminophen (TYLENOL) tablet 650 mg  650 mg Oral Q6H PRN TaNanci PinaFNP   650 mg at 05/07/16 0752  . alum & mag hydroxide-simeth (MAALOX/MYLANTA) 200-200-20 MG/5ML suspension 30 mL  30 mL Oral Q4H PRN TaNanci PinaFNP      . chlordiazePOXIDE (LIBRIUM) capsule 25 mg  25 mg Oral TID FeJenne CampusMD   25 mg at 05/07/16 1121   Followed by  . [START ON 05/08/2016] chlordiazePOXIDE (LIBRIUM) capsule 25 mg  25 mg Oral BH-qamhs FeJenne CampusMD       Followed by  . [START ON 05/09/2016] chlordiazePOXIDE (LIBRIUM) capsule 25 mg  25 mg Oral Daily Ioanna Colquhoun A Maitland Lesiak, MD      . DULoxetine (CYMBALTA) DR capsule 20 mg  20 mg Oral Daily Fe[facility] CampusMD   20 mg at 05/07/16 0745  . gabapentin (NEURONTIN) capsule 300 mg  300 mg Oral TID FeJenne CampusMD   300 mg at 05/07/16 1121  . hydrOXYzine (ATARAX/VISTARIL) tablet 25 mg  25 mg Oral TID PRN TaNanci PinaFNP   25 mg at 05/05/16 1353  . magnesium hydroxide (MILK OF MAGNESIA) suspension 30 mL  30 mL Oral Daily PRN TaNanci PinaFNP      . multivitamin with minerals tablet 1 tablet  1 tablet Oral Daily TaNanci PinaFNP   1 tablet at 05/05/16 0756  . nicotine (NICODERM CQ - dosed in mg/24 hours) patch 21 mg  21 mg Transdermal Daily Fe[facility] CampusMD   21  mg at 05/05/16 0755  . thiamine (B-1) injection 100 mg  100 mg Intramuscular Once TaNanci PinaFNP      . thiamine (VITAMIN B-1) tablet 100 mg  100 mg Oral Daily TaNanci PinaFNP   100 mg at 05/05/16 0756  . traZODone (DESYREL) tablet 50 mg  50 mg Oral QHS PRN TaNanci PinaFNP   50 mg at 05/06/16 2148    Lab Results:  No results found for this or any previous visit (from the past 48 hour(s)).  Blood  Alcohol level:  Lab Results  Component Value Date   ETH 264 (H) 09/73/5329    Metabolic Disorder Labs: Lab Results  Component Value Date   HGBA1C 5.2 05/05/2016   MPG 103 05/05/2016   No results found for: PROLACTIN Lab Results  Component Value Date   CHOL 198 05/05/2016   TRIG 92 05/05/2016   HDL 51 05/05/2016   CHOLHDL 3.9 05/05/2016   VLDL 18 05/05/2016   LDLCALC 129 (H) 05/05/2016    Physical Findings: AIMS: Facial and Oral Movements Muscles of Facial Expression: None, normal Lips and Perioral Area: None, normal Jaw: None, normal Tongue: None, normal,Extremity Movements Upper (arms, wrists, hands, fingers): None, normal Lower (legs, knees, ankles, toes): None, normal, Trunk Movements Neck, shoulders, hips: None, normal, Overall Severity Severity of abnormal movements (highest score from questions above): None, normal Incapacitation due to abnormal movements: None, normal Patient's awareness of abnormal movements (rate only patient's report): No Awareness, Dental Status Current problems with teeth and/or dentures?: No Does patient usually wear dentures?: No  CIWA:  CIWA-Ar Total: 4 COWS:  COWS Total Score: 4  Musculoskeletal: Strength & Muscle Tone: within normal limits Gait & Station: normal Patient leans: N/A  Psychiatric Specialty Exam: Physical Exam  ROS no visual disturbances, no shortness of breath, no chest pain, no vomiting   Blood pressure 110/67, pulse 66, temperature 97.5 F (36.4 C), temperature source Oral, resp. rate 16, height '5\' 1"'   (1.549 m), weight 55.8 kg (123 lb).Body mass index is 23.24 kg/m.  General Appearance: improved grooming   Eye Contact:  Good  Speech:  Normal Rate  Volume:  Normal  Mood:  Partially improved  Affect:  More reactive , some anxiety   Thought Process:  Linear  Orientation:  Full (Time, Place, and Person)  Thought Content:  no hallucinations, no delusions, not internally preoccupied  Suicidal Thoughts:  No at this time denies any suicidal or self injurious ideations, denies any homicidal or violent ideations   Homicidal Thoughts:  No  Memory:  recent and remote grossly intact  Judgement:  Other:  improving   Insight:  improving   Psychomotor Activity:  Normal  Concentration:  Concentration: Good and Attention Span: Good  Recall:  Good  Fund of Knowledge:  Good  Language:  Good  Akathisia:  Negative  Handed:  Right  AIMS (if indicated):     Assets:  Desire for Improvement Resilience  ADL's:  Intact  Cognition:  WNL  Sleep:  Number of Hours: 6.25   Assessment -patient reports feeling subjectively anxious, jittery and irritable, which she attributes to BZD withdrawal. Of note, she has no restlessness, diaphoresis, headache, visual disturbances, and vitals are stable. She does have some minimal distal tremors. She has been on Xanax for several years, but is motivated in detoxing off BZDs and minimizing long term use of potentially addictive medications. She was also using opiate analgesics prior to admission but is not endorsing any opiate withdrawal symptoms.  No suicidal ideations.    Treatment Plan Summary: Daily contact with patient to assess and evaluate symptoms and progress in treatment, Medication management, Plan inpatient admission and medications as below Encourage ongoing group and milieu participation to work on coping skills and symptom reduction Treatment team working on disposition planning options  Continue Librium detox protocol to minimize risk of BZD WDL -I have  extended Librium detox protocol by 2 days in order to help minimize potential WDL symptoms. . Continue Vistaril 25 mgrs Q 6 hours PRN for  anxiety Continue Cymbalta 20 mgrs QDAY for depression, anxiety, chronic pain Increase  Neurontin to 300 mgrs TID  for anxiety and chronic pain - hold if sedated .   Neita Garnet, MD 05/07/2016, 12:51 PM   Patient ID: Victoria Galvan, female   DOB: 08-02-1970, 46 y.o.   MRN: [id number]

## 2016-05-07 NOTE — Progress Notes (Signed)
D: Pt denies SI/HI/AVH. Pt is pleasant and cooperative. Pt goal for today is to work on her anxiety. A: Pt was offered support and encouragement. Pt was given scheduled medications. Pt was encourage to attend groups. Q 15 minute checks were done for safety.  R:Pt attends groups and interacts well with peers and staff. Pt is taking medication. Pt has no complaints.Pt receptive to treatment and safety maintained on unit.

## 2016-05-07 NOTE — [facility] Group Notes (Signed)
Healthy Support Systems Date:  05/07/2016  Time:  0930  Type of Therapy:  Nurse Education  Healthy Support Systems:  The group focuses on teaching patients how to develop healthy support systems and the importance  Of utilizing them to maintain homeostasis in our lives.  Participation Level:  Did Not Attend  Participation Quality:    Affect:    Cognitive:    Insight:    Engagement in Group:    Modes of Intervention:    Summary of Progress/Problems:  Rich BraveDuke, Anavi Branscum Lynn 05/07/2016, 12:47 PM

## 2016-05-07 NOTE — [facility] Group Notes (Signed)
[facility] Group Notes:  (Clinical Social Work)  05/07/2016  10:00-11:00AM  Summary of Progress/Problems:   The main focus of today's process group was to   1)  demonstrate the importance of adding supports  2)  discuss 4 definitions of support  3)  identify the patient's current level of healthy support and   4)  elicit commitments to add one healthy support  An emphasis was placed on using counselor, doctor, therapy groups, 12-step groups, and problem-specific support groups to expand supports.    The patient verbally refused to participate in group, sat in the room for about a half hour listening and then left and did not return.   Type of Therapy:  Process Group  Participation Level:  None  Participation Quality:  Resistant  Affect:  Blunted  Cognitive:  N/A  Insight:  Limited  Engagement in Therapy:  Poor  Modes of Intervention:   Education, Support and Processing, Activity  Victoria MantleMareida Grossman-Orr, LCSW 05/07/2016  12:25 PM

## 2016-05-07 NOTE — [facility] Group Notes (Signed)
Pt rated her day a 6 out of 10. Pt goal is to have a more balanced day with her anxiety then she did today.

## 2016-05-07 NOTE — Progress Notes (Signed)
D: Patient's main complaint today has been anxiety.  Her neurontin was increased to 300 mg and her librium was given for anxiety.  Patient appears to have some withdrawal symptoms from xanax.  She is attending groups with minimal participation.  Requested patient fill out her self inventory, however, she has not yet turned it in.  Patient can be irritable at times.  She has been pleasant and cooperative today.  Her affect is restricted; her mood is sad and depressed. A: Continue to monitor medication management and MD orders.  Safety checks completed every 15 minutes per protocol.  Offer support and encouragement as needed. R: Patient forwards little; she appears withdrawn.

## 2016-05-07 NOTE — Plan of Care (Signed)
Problem: Activity: Goal: Interest or engagement in activities will improve Outcome: Progressing Patient is attending groups and appeared engaged with participation.

## 2016-05-08 DIAGNOSIS — F17210 Nicotine dependence, cigarettes, uncomplicated: Secondary | ICD-10-CM

## 2016-05-08 DIAGNOSIS — Z8261 Family history of arthritis: Secondary | ICD-10-CM

## 2016-05-08 DIAGNOSIS — F411 Generalized anxiety disorder: Secondary | ICD-10-CM

## 2016-05-08 DIAGNOSIS — Z801 Family history of malignant neoplasm of trachea, bronchus and lung: Secondary | ICD-10-CM

## 2016-05-08 DIAGNOSIS — R45851 Suicidal ideations: Secondary | ICD-10-CM

## 2016-05-08 DIAGNOSIS — Z9889 Other specified postprocedural states: Secondary | ICD-10-CM

## 2016-05-08 DIAGNOSIS — Z79899 Other long term (current) drug therapy: Secondary | ICD-10-CM

## 2016-05-08 DIAGNOSIS — F332 Major depressive disorder, recurrent severe without psychotic features: Principal | ICD-10-CM

## 2016-05-08 MED ORDER — ONDANSETRON HCL 4 MG PO TABS
4.0000 mg | ORAL_TABLET | Freq: Three times a day (TID) | ORAL | Status: DC | PRN
  Administered 2016-05-08: 4 mg via ORAL
  Filled 2016-05-08: qty 1

## 2016-05-08 MED ORDER — HYDROXYZINE HCL 50 MG PO TABS
50.0000 mg | ORAL_TABLET | Freq: Four times a day (QID) | ORAL | Status: DC | PRN
  Administered 2016-05-08 – 2016-05-09 (×4): 50 mg via ORAL
  Filled 2016-05-08 (×4): qty 1

## 2016-05-08 MED ORDER — TRAZODONE HCL 100 MG PO TABS
100.0000 mg | ORAL_TABLET | Freq: Every day | ORAL | Status: DC
  Administered 2016-05-08: 100 mg via ORAL
  Filled 2016-05-08 (×3): qty 1

## 2016-05-08 MED ORDER — GABAPENTIN 300 MG PO CAPS
300.0000 mg | ORAL_CAPSULE | Freq: Three times a day (TID) | ORAL | Status: DC
  Administered 2016-05-08 – 2016-05-09 (×4): 300 mg via ORAL
  Filled 2016-05-08 (×11): qty 1

## 2016-05-08 NOTE — Progress Notes (Signed)
D:  Patient's self inventory sheet, patient has poor sleep, sleep medication not helpful.  Good appetite, normal energy level, good concentration.  Denied depression, hopeless. Rated anxiety 7.  Withdrawals, agitation, irritability, stuffy nose.  Denied SI.  Physical problems, pain, headaches.  Physical pain, neck back, worst pain #8 in past 24 hours.  Pain medication is helpful.  Goal is relaxing.  Plans to deep breathe.  No discharge plans. A:  Patient took scheduled medications this morning, except her vitamins and thiamine.  Stated these medications upset her stomach. Emotional support and encouragement given patient. R:  Denied SI and HI, contracts for safety.  Denied A/V hallucinations.  Safety maintained with 15 minute checks.

## 2016-05-08 NOTE — Progress Notes (Signed)
Recreation Therapy Notes  Date: 05/08/16 Time: 0930 Location: [address] Dayroom  Group Topic: Stress Management  Goal Area(s) Addresses:  Patient will verbalize importance of using healthy stress management.  Patient will identify positive emotions associated with healthy stress management.   Intervention: Stress Management  Activity :  Peaceful Waves Guided Imagery.  LRT introduced the stress management concept of guided imagery.  LRT read Galvan script to allow patients the opportunity engage and participate in the activity.  Patients were to follow along as LRT read script to participate in the activity.  Education:  Stress Management, Discharge Planning.   Education Outcome: Acknowledges edcuation/In group clarification offered/Needs additional education  Clinical Observations/Feedback: Pt did not attend group.   Victoria Galvan, LRT/CTRS         Victoria Galvan 05/08/2016 12:21 PM 

## 2016-05-08 NOTE — Progress Notes (Cosign Needed)
Adult Psychoeducational Group Note  Date:  05/08/2016 Time:  11:23 AM  Group Topic/Focus:  Coping With Mental Health Crisis:   The purpose of this group is to help patients identify strategies for coping with mental health crisis.  Group discusses possible causes of crisis and ways to manage them effectively.   Participation Level:  Active  Participation Quality:  Appropriate  Affect:  Appropriate  Cognitive:  Appropriate  Insight: Appropriate  Engagement in Group:  Engaged  Modes of Intervention:  Discussion  Additional Comments:  Pt did attend group, pt stated that she mixed drugs and alcohol and that it did not end well.  Pt states that she has no past addiction problems and does not plan on repeating what she did. Presley Gora R Daryl Quiros 05/08/2016, 11:23 AM

## 2016-05-08 NOTE — [facility] Group Notes (Signed)
[facility] LCSW Group Therapy  05/08/2016 1:15pm  Type of Therapy:  Group Therapy vercoming Obstacles  Participation Level:  Minimal  Participation Quality:  N/A  Affect:  Flat, Withdrawn  Cognitive:  Appropriate and Oriented  Insight:  Unable to assess  Engagement in Therapy:  Minimal  Modes of Intervention:  Discussion, Exploration, Problem-solving and Support  Description of Group:   In this group patients will be encouraged to explore what they see as obstacles to their own wellness and recovery. They will be guided to discuss their thoughts, feelings, and behaviors related to these obstacles. The group will process together ways to cope with barriers, with attention given to specific choices patients can make. Each patient will be challenged to identify changes they are motivated to make in order to overcome their obstacles. This group will be process-oriented, with patients participating in exploration of their own experiences as well as giving and receiving support and challenge from other group members.    Therapeutic Modalities:   Cognitive Behavioral Therapy Solution Focused Therapy Motivational Interviewing Relapse Prevention Therapy   Victoria ShanksLauren Mckenzey Parcell, LCSW 05/08/2016 6:10 PM

## 2016-05-08 NOTE — Progress Notes (Signed)
[facility] MD Progress Note  05/08/2016 3:06 PM Victoria Galvan  MRN:  [id number]  Subjective:  Victoria Galvan reports, "I'm still feeliing very anxious, not feeling depression. High anxiety has always been my problems. I have not slept well in three days. I was on Xanax qid & I smoke cigarettes. But, now, I'm on none of those. The anxiety is driving me wild. I need to sleep at night & rest during the day. I feel very jumpy"   Objective : I have reviewed chart notes and have met with patient. Patient presents feeling highly anxious anxious, but affect is improved compared to admission , more reactive. As above, reports subjective symptoms of WDL/Anxiety, but vitals remain stable, does not present psycho-motorically restless or agitated, no diaphoresis. Subtle distal tremors. Patient has been on benzodiazepines for anxiety for years, and states she wants to taper off  BZDs .  Denies suicidal ideations, no psychotic symptoms. Visible in day room, going to some groups. Neurontin & Trazodone doses adjusted to meet patient's needs.  Principal Problem: Suicidal ideations Diagnosis:   Patient Active Problem List   Diagnosis Date Noted  . Suicidal ideations [R45.851]   . MDD (major depressive disorder), recurrent episode, severe ([place]) [F33.2] 05/03/2016  . GAD (generalized anxiety disorder) [F41.1] 05/13/2015  . Myalgia [M79.1] 08/07/2014  . Positive ANA (antinuclear antibody) [R76.8] 08/07/2014  . Hypothyroidism [E03.9] 08/07/2014   Total Time spent with patient: 15 minutes  Past Medical History:  Past Medical History:  Diagnosis Date  . Anxiety disorder    with panic  . Cervical spinal stenosis    spondylosis  . Chronic nausea    likely from chronic narcotic pain med use  . Chronic neck pain   . Colon polyps    Pt states "colon polyp was removed when they did my hysterectomy"--need old records to veriffy.  . Connective tissue disease ([place])    Dr. Philis Pique.  Started plaquenil 09/2014.  .  Fibromyalgia    per pt report  . History of cervical cancer   . History of frequent urinary tract infections   . Hypothyroidism    med noncompliance  . Positive ANA (antinuclear antibody)   . Tobacco dependence     Past Surgical History:  Procedure Laterality Date  . Adhesions removed  2008  . ESOPHAGOGASTRODUODENOSCOPY  2005   mild gastritis; h pylori neg.  Says she has had esoph dilation several times  . holter monitor  2004   Normal  . MRI brain  2002;2004   Normal  . OOPHORECTOMY  2007   right-path neg  . OOPHORECTOMY  2012   left  . TONSILLECTOMY  1978  . TRANSTHORACIC ECHOCARDIOGRAM  2004   Normal  . VAGINAL HYSTERECTOMY  1999   for cervical cancer per pt   Family History:  Family History  Problem Relation Age of Onset  . Arthritis Mother   . Cancer Mother     Lung  . Arthritis Father    Social History:  History  Alcohol Use  . 0.0 oz/week    Comment: occasionally     History  Drug Use No    Social History   Social History  . Marital status: Married    Spouse name: N/A  . Number of children: 2  . Years of education: College   Occupational History  . CNA Unemployed   Social History Main Topics  . Smoking status: Current Every Day Smoker    Packs/day: 0.50    Types: Cigarettes  .  Smokeless tobacco: Never Used  . Alcohol use 0.0 oz/week     Comment: occasionally  . Drug use: No  . Sexual activity: Yes    Birth control/ protection: Surgical   Other Topics Concern  . None   Social History Narrative   Married.   Education: GED, Psychologist, occupational.   Occupation: unemployed, formerly worked as a Quarry manager.   Additional Social History:    Pain Medications: reported using Oxycodone 54m 4 times per day Prescriptions: see MAR Over the Counter: None reported History of alcohol / drug use?: Yes Longest period of sobriety (when/how long): unknown Negative Consequences of Use: Personal relationships Withdrawal Symptoms:  ("I have never had withdrawal  symptoms, I don't drink") Name of Substance 1: Alcohol 1 - Age of First Use: unknown 1 - Amount (size/oz): "3 shots is all I remember" 1 - Frequency: "I've drank 3 shots last night,  once, I don't drink" 1 - Duration: "I only drank this once" 1 - Last Use / Amount: 05/01/16 Name of Substance 2: Tobacco/Nicotine/Cigarettes 2 - Age of First Use: unknown 2 - Amount (size/oz): 1/2 pack 2 - Frequency: daily (per pt record) 2 - Duration: ongoing 2 - Last Use / Amount: 05/01/16 Name of Substance 3: Benzodiazepines (possible Abuse/Dependence per pt record Jan 2017) 3 - Age of First Use: unknown 3 - Amount (size/oz): 278m3 - Frequency: 4 times per day 3 - Duration: unknown 3 - Last Use / Amount: yesterday  Sleep:  improving   Appetite:  Good  Current Medications: Current Facility-Administered Medications  Medication Dose Route Frequency Provider Last Rate Last Dose  . acetaminophen (TYLENOL) tablet 650 mg  650 mg Oral Q6H PRN TaNanci PinaFNP   650 mg at 05/08/16 0825  . alum & mag hydroxide-simeth (MAALOX/MYLANTA) 200-200-20 MG/5ML suspension 30 mL  30 mL Oral Q4H PRN TaNanci PinaFNP      . chlordiazePOXIDE (LIBRIUM) capsule 25 mg  25 mg Oral [facility] CampusMD   25 mg at 05/08/16 [id number] Followed by  . [START ON 05/09/2016] chlordiazePOXIDE (LIBRIUM) capsule 25 mg  25 mg Oral Daily Fernando A Cobos, MD      . DULoxetine (CYMBALTA) DR capsule 20 mg  20 mg Oral Daily [facility] Campus[facility]   20 mg at 05/08/16 081443. gabapentin (NEURONTIN) capsule 300 mg  300 mg Oral TID [facility]   300 mg at 05/08/16 1122  . hydrOXYzine (ATARAX/VISTARIL) tablet 25 mg  25 mg Oral TID PRN TaNanci PinaFNP   25 mg at 05/08/16 1151  . magnesium hydroxide (MILK OF MAGNESIA) suspension 30 mL  30 mL Oral Daily PRN TaNanci PinaFNP      . multivitamin with minerals tablet 1 tablet  1 tablet Oral Daily TaNanci PinaFNP   1 tablet at 05/05/16 0756  . nicotine (NICODERM CQ - dosed  in mg/24 hours) patch 21 mg  21 mg Transdermal Daily [facility]   21 mg at 05/05/16 0755  . thiamine (B-1) injection 100 mg  100 mg Intramuscular Once TaNanci PinaFNP      . thiamine (VITAMIN B-1) tablet 100 mg  100 mg Oral Daily TaNanci PinaFNP   100 mg at 05/05/16 0756  . traZODone (DESYREL) tablet 50 mg  50 mg Oral QHS PRN TaNanci PinaFNP   50 mg at 05/07/16 2234   Lab Results:  No  results found for this or any previous visit (from the past 65 hour(s)).  Blood Alcohol level:  Lab Results  Component Value Date   ETH 264 (H) 16/01/9603    Metabolic Disorder Labs: Lab Results  Component Value Date   HGBA1C 5.2 05/05/2016   MPG 103 05/05/2016   No results found for: PROLACTIN Lab Results  Component Value Date   CHOL 198 05/05/2016   TRIG 92 05/05/2016   HDL 51 05/05/2016   CHOLHDL 3.9 05/05/2016   VLDL 18 05/05/2016   LDLCALC 129 (H) 05/05/2016   Physical Findings: AIMS: Facial and Oral Movements Muscles of Facial Expression: None, normal Lips and Perioral Area: None, normal Jaw: None, normal Tongue: None, normal,Extremity Movements Upper (arms, wrists, hands, fingers): None, normal Lower (legs, knees, ankles, toes): None, normal, Trunk Movements Neck, shoulders, hips: None, normal, Overall Severity Severity of abnormal movements (highest score from questions above): None, normal Incapacitation due to abnormal movements: None, normal Patient's awareness of abnormal movements (rate only patient's report): No Awareness, Dental Status Current problems with teeth and/or dentures?: No Does patient usually wear dentures?: No  CIWA:  CIWA-Ar Total: 1 COWS:  COWS Total Score: 1  Musculoskeletal: Strength & Muscle Tone: within normal limits Gait & Station: normal Patient leans: N/A  Psychiatric Specialty Exam: Physical Exam  ROS no visual disturbances, no shortness of breath, no chest pain, no vomiting   Blood pressure 113/73, pulse 70,  temperature 97.7 F (36.5 C), temperature source Oral, resp. rate 17, height '5\' 1"'  (1.549 m), weight 55.8 kg (123 lb).Body mass index is 23.24 kg/m.  General Appearance: improved grooming   Eye Contact:  Good  Speech:  Normal Rate  Volume:  Normal  Mood:  Partially improved  Affect:  More reactive , some anxiety   Thought Process:  Linear  Orientation:  Full (Time, Place, and Person)  Thought Content:  no hallucinations, no delusions, not internally preoccupied  Suicidal Thoughts:  No at this time denies any suicidal or self injurious ideations, denies any homicidal or violent ideations   Homicidal Thoughts:  No  Memory:  recent and remote grossly intact  Judgement:  Other:  improving   Insight:  improving   Psychomotor Activity:  Normal  Concentration:  Concentration: Good and Attention Span: Good  Recall:  Good  Fund of Knowledge:  Good  Language:  Good  Akathisia:  Negative  Handed:  Right  AIMS (if indicated):     Assets:  Desire for Improvement Resilience  ADL's:  Intact  Cognition:  WNL  Sleep:  Number of Hours: 6.75   Assessment -patient reports feeling subjectively anxious, jittery and irritable, which she attributes to BZD withdrawal. Of note, she has no restlessness, diaphoresis, headache, visual disturbances, and vitals are stable. She does have some minimal distal tremors. She has been on Xanax for several years, but is motivated in detoxing off BZDs and minimizing long term use of potentially addictive medications. She was also using opiate analgesics prior to admission but is not endorsing any opiate withdrawal symptoms.  No suicidal ideations.  Treatment Plan Summary: Daily contact with patient to assess and evaluate symptoms and progress in treatment, Medication management, Plan inpatient admission and medications as below Encourage ongoing group and milieu participation to work on coping skills and symptom reduction Treatment team working on disposition planning  options  Continue Librium detox protocol to minimize risk of BZD WDL- extended Librium detox protocol by 2 days in order to help minimize potential WDL symptoms.  Continue Vistaril 25 mgrs Q 6 hours PRN for anxiety Continue Cymbalta 20 mgrs QDAY for depression, anxiety, chronic pain Increased Neurontin to 300 mgrs QID  for anxiety and chronic pain - hold if sedated. Increased Trazodone to 100 mg Q hs for insomnia.   Lindell Spar I, NP 05/08/2016, 3:06 PM   Patient ID: Victoria Galvan, female   DOB: 1971-02-24, 46 y.o.   MRN: [id number] Patient ID: Victoria Galvan, female   DOB: January 23, 1971, 46 y.o.   MRN: [id number] I agree with NP Progress Note

## 2016-05-08 NOTE — Progress Notes (Signed)
D: Pt denies SI/HI/AVH. Pt is pleasant and cooperative. Pt goal for today is to read more when anxious to help relax and control anxiety. A: Pt was offered support and encouragement. Pt was given scheduled medications. Pt was encourage to attend groups. Q 15 minute checks were done for safety.  R:Pt attends groups and interacts well with peers and staff. Pt is taking medication. Pt has no complaints.Pt receptive to treatment and safety maintained on unit.

## 2016-05-08 NOTE — Progress Notes (Signed)
Adult Psychoeducational Group Note  Date:  05/08/2016 Time:  9:57 PM  Group Topic/Focus:  Wrap-Up Group:   The focus of this group is to help patients review their daily goal of treatment and discuss progress on daily workbooks.   Participation Level:  Minimal  Participation Quality:  Monopolizing  Affect:  Blunted  Cognitive:  Appropriate  Insight: Good  Engagement in Group:  Engaged  Modes of Intervention:  Activity  Additional Comments:  Patient rated her day a 5. Goal is to prepare for discharge. Natasha MeadKiara M Kewanna Kasprzak 05/08/2016, 9:57 PM

## 2016-05-08 NOTE — Plan of Care (Signed)
Problem: Education: Goal: Ability to state activities that reduce stress will improve Outcome: Progressing Nurse discussed depression/coping skills with patient.    

## 2016-05-08 NOTE — Progress Notes (Signed)
Patient ID: Victoria Galvan, female   DOB: 1970-06-09, 46 y.o.   MRN: [id number]  Pt currently presents with an anxious affect and impulsive, intrusive behavior. Pt reports to Clinical research associatewriter that their goal is to "learn coping skills." Pt states "I have been using laughter as a coping skill today and it has really worked for me." Pt reports poor sleep with current medication regimen.   Pt provided with medications per providers orders. Pt's labs and vitals were monitored throughout the night. Pt supported emotionally and encouraged to express concerns and questions. Pt educated on medications and alternative sleep aid tips, patient needs reinforcement.   Pt's safety ensured with 15 minute and environmental checks. Pt currently denies SI/HI and A/V hallucinations. Pt verbally agrees to seek staff if SI/HI or A/VH occurs and to consult with staff before acting on any harmful thoughts. Will continue POC.

## 2016-05-09 MED ORDER — DULOXETINE HCL 40 MG PO CPEP: 40.0000 mg | ORAL_CAPSULE | Freq: Every day | ORAL | 0 refills | Status: DC

## 2016-05-09 MED ORDER — HYDROXYZINE HCL 50 MG PO TABS
50.0000 mg | ORAL_TABLET | Freq: Four times a day (QID) | ORAL | 0 refills | Status: DC | PRN
Start: 2016-05-09 — End: 2017-03-21

## 2016-05-09 MED ORDER — NICOTINE 21 MG/24HR TD PT24: 21.0000 mg | MEDICATED_PATCH | Freq: Every day | TRANSDERMAL | 0 refills | Status: DC

## 2016-05-09 MED ORDER — TRAZODONE HCL 100 MG PO TABS: 100.0000 mg | ORAL_TABLET | Freq: Every day | ORAL | 0 refills | Status: DC

## 2016-05-09 MED ORDER — DULOXETINE HCL 20 MG PO CPEP
40.0000 mg | ORAL_CAPSULE | Freq: Every day | ORAL | Status: DC
  Filled 2016-05-09 (×2): qty 2

## 2016-05-09 MED ORDER — GABAPENTIN 300 MG PO CAPS: 300.0000 mg | ORAL_CAPSULE | Freq: Three times a day (TID) | ORAL | 0 refills | Status: DC

## 2016-05-09 NOTE — Tx Team (Signed)
Interdisciplinary Treatment and Diagnostic Plan Update  05/09/2016 Time of Session: 10:49 AM  Victoria Galvan MRN: [id number]  Principal Diagnosis: Suicidal ideations  Secondary Diagnoses: Principal Problem:   Suicidal ideations Active Problems:   MDD (major depressive disorder), recurrent episode, severe (HCC)   Current Medications:  Current Facility-Administered Medications  Medication Dose Route Frequency Provider Last Rate Last Dose  . acetaminophen (TYLENOL) tablet 650 mg  650 mg Oral Q6H PRN Truman Hayward, FNP   650 mg at 05/08/16 2137  . alum & mag hydroxide-simeth (MAALOX/MYLANTA) 200-200-20 MG/5ML suspension 30 mL  30 mL Oral Q4H PRN Truman Hayward, FNP      . [START ON 05/10/2016] DULoxetine (CYMBALTA) DR capsule 40 mg  40 mg Oral Daily Fernando A Cobos, MD      . gabapentin (NEURONTIN) capsule 300 mg  300 mg Oral TID PC & HS Rockey Situ Cobos, MD   300 mg at 05/09/16 4098  . hydrOXYzine (ATARAX/VISTARIL) tablet 50 mg  50 mg Oral Q6H PRN Sanjuana Kava, NP   50 mg at 05/09/16 0606  . magnesium hydroxide (MILK OF MAGNESIA) suspension 30 mL  30 mL Oral Daily PRN Truman Hayward, FNP      . multivitamin with minerals tablet 1 tablet  1 tablet Oral Daily Truman Hayward, FNP   1 tablet at 05/05/16 0756  . nicotine (NICODERM CQ - dosed in mg/24 hours) patch 21 mg  21 mg Transdermal Daily Craige Cotta, MD   21 mg at 05/05/16 0755  . ondansetron (ZOFRAN) tablet 4 mg  4 mg Oral Q8H PRN Sanjuana Kava, NP   4 mg at 05/08/16 1605  . thiamine (B-1) injection 100 mg  100 mg Intramuscular Once Truman Hayward, FNP      . thiamine (VITAMIN B-1) tablet 100 mg  100 mg Oral Daily Truman Hayward, FNP   100 mg at 05/05/16 0756  . traZODone (DESYREL) tablet 100 mg  100 mg Oral QHS Sanjuana Kava, NP   100 mg at 05/08/16 2134    PTA Medications: Prescriptions Prior to Admission  Medication Sig Dispense Refill Last Dose  . alprazolam (XANAX) 2 MG tablet 1 tab po qid prn (Patient taking  differently: Take 1 mg by mouth 4 (four) times daily as needed for anxiety. 1 tab po qid prn) 120 tablet 5 05/02/2016 at Unknown time  . gabapentin (NEURONTIN) 300 MG capsule Take 300 mg by mouth 3 (three) times daily.    05/02/2016 at Unknown time  . levothyroxine (SYNTHROID, LEVOTHROID) 50 MCG tablet Take 50 mcg by mouth daily before breakfast.   0 05/03/2016 at Unknown time  . Levothyroxine Sodium 25 MCG CAPS 1 tab po qd (to be taken with 200 mcg levothyroxine tab for total daily dose of 225 mcg) (Patient not taking: Reported on 05/03/2016) 30 capsule 3 Not Taking at Unknown time  . ondansetron (ZOFRAN-ODT) 8 MG disintegrating tablet DISSOLVE 1 TABLET UNDER TONGUE TWICE DAILY AS NEEDED FOR NAUSEA 60 tablet 3 05/02/2016 at Unknown time  . Oxycodone HCl 10 MG TABS Take 10 mg by mouth every 6 (six) hours as needed (for pain).   0 05/02/2016 at Unknown time  . PARoxetine (PAXIL) 20 MG tablet Take one tablet at bedtime daily (Patient not taking: Reported on 05/03/2016) 30 tablet 1 Not Taking at Unknown time    Treatment Modalities: Medication Management, Group therapy, Case management,  1 to 1 session with clinician, Psychoeducation, Recreational therapy.  Patient Stressors: Health problems Marital or family conflict Substance abuse  Patient Strengths: Capable of independent living Wellsite geologistCommunication skills General fund of knowledge  Physician Treatment Plan for Primary Diagnosis: Suicidal ideations Long Term Goal(s): Improvement in symptoms so as ready for discharge  Short Term Goals: Ability to verbalize feelings will improve Ability to disclose and discuss suicidal ideas Ability to demonstrate self-control will improve Ability to identify and develop effective coping behaviors will improve Ability to maintain clinical measurements within normal limits will improve Ability to verbalize feelings will improve Ability to disclose and discuss suicidal ideas Ability to demonstrate self-control will  improve Ability to identify and develop effective coping behaviors will improve Ability to maintain clinical measurements within normal limits will improve  Medication Management: Evaluate patient's response, side effects, and tolerance of medication regimen.  Therapeutic Interventions: 1 to 1 sessions, Unit Group sessions and Medication administration.  Evaluation of Outcomes: Adequate for Discharge  Physician Treatment Plan for Secondary Diagnosis: Principal Problem:   Suicidal ideations Active Problems:   MDD (major depressive disorder), recurrent episode, severe (HCC)   Long Term Goal(s): Improvement in symptoms so as ready for discharge  Short Term Goals: Ability to verbalize feelings will improve Ability to disclose and discuss suicidal ideas Ability to demonstrate self-control will improve Ability to identify and develop effective coping behaviors will improve Ability to maintain clinical measurements within normal limits will improve Ability to verbalize feelings will improve Ability to disclose and discuss suicidal ideas Ability to demonstrate self-control will improve Ability to identify and develop effective coping behaviors will improve Ability to maintain clinical measurements within normal limits will improve  Medication Management: Evaluate patient's response, side effects, and tolerance of medication regimen.  Therapeutic Interventions: 1 to 1 sessions, Unit Group sessions and Medication administration.  Evaluation of Outcomes: Adequate for Discharge   RN Treatment Plan for Primary Diagnosis: Suicidal ideations Long Term Goal(s): Knowledge of disease and therapeutic regimen to maintain health will improve  Short Term Goals: Ability to verbalize feelings will improve, Ability to disclose and discuss suicidal ideas and Ability to identify and develop effective coping behaviors will improve  Medication Management: RN will administer medications as ordered by  provider, will assess and evaluate patient's response and provide education to patient for prescribed medication. RN will report any adverse and/or side effects to prescribing provider.  Therapeutic Interventions: 1 on 1 counseling sessions, Psychoeducation, Medication administration, Evaluate responses to treatment, Monitor vital signs and CBGs as ordered, Perform/monitor CIWA, COWS, AIMS and Fall Risk screenings as ordered, Perform wound care treatments as ordered.  Evaluation of Outcomes: Adequate for Discharge   LCSW Treatment Plan for Primary Diagnosis: Suicidal ideations Long Term Goal(s): Safe transition to appropriate next level of care at discharge, Engage patient in therapeutic group addressing interpersonal concerns.  Short Term Goals: Engage patient in aftercare planning with referrals and resources, Identify triggers associated with mental health/substance abuse issues and Increase skills for wellness and recovery  Therapeutic Interventions: Assess for all discharge needs, 1 to 1 time with Social worker, Explore available resources and support systems, Assess for adequacy in community support network, Educate family and significant other(s) on suicide prevention, Complete Psychosocial Assessment, Interpersonal group therapy.  Evaluation of Outcomes: Adequate for Discharge   Progress in Treatment: Attending groups: Yes  Participating in groups: Yes  Taking medication as prescribed: Yes, MD continues to assess for medication changes as needed Toleration medication: Yes, no side effects reported at this time Family/Significant other contact made: Yes with husband Patient  understands diagnosis: Developing insigh Discussing patient identified problems/goals with staff: Yes Medical problems stabilized or resolved: Yes Denies suicidal/homicidal ideation: Yes Issues/concerns per patient self-inventory: None Other: N/A  New problem(s) identified: None identified at this time.    New Short Term/Long Term Goal(s): None identified at this time.   Discharge Plan or Barriers: Pt will return home and follow-up with outpatient services.   Reason for Continuation of Hospitalization: None identified at this time.   Estimated Length of Stay: 0 days  Attendees: Patient: 05/09/2016  10:49 AM  Physician: Dr. Jama Flavors 05/09/2016  10:49 AM  Nursing: Quintella Reichert, RN 05/09/2016  10:49 AM  RN Care Manager: Onnie Boer, RN 05/09/2016  10:49 AM  Social Worker: Vernie Shanks, LCSW; Heather Smart, LCSW 05/09/2016  10:49 AM  Recreational Therapist:  05/09/2016  10:49 AM  Other: Gray Bernhardt, NP; Malachy Chamber, NP; Hillery Jacks, NP 05/09/2016  10:49 AM  Other:  05/09/2016  10:49 AM  Other: 05/09/2016  10:49 AM    Scribe for Treatment Team: Verdene Lennert, LCSW 05/09/2016 10:49 AM

## 2016-05-09 NOTE — [facility] Group Notes (Signed)

## 2016-05-09 NOTE — Progress Notes (Signed)
Discharge Note:  Patient discharged home with family member.  Patient denied SI and HI.  Denied A/V hallucinations.  Suicide prevention information given and discussed with patient who stated she understood and had no questions.  Patient stated she received all her belongings.  Patient stated she appreciated all assistance received from [facility] staff.  All required discharge information given to patient at discharge.  

## 2016-05-09 NOTE — Progress Notes (Signed)
Pt sought out chaplain while chaplain rounding on unit after group.  Requested follow up.  Will follow up with pt after pt returns to unit from lunch  Belva CromeStalnaker, Reve Crocket Wayne MDiv

## 2016-05-09 NOTE — Progress Notes (Signed)
D:  Patient's self inventory sheet, patient has very poor sleep, sleep medication not helpful.  Good appetite, low energy level, good concentration.  Denied depression and hopeless.  Rated anxiety #9.  Withdrawals, agitation, irritability.  Denied SI.  Physical problems, pain, headaches, blurred vision.  Physical pain, neck/back, worst pain #8.  Pain medication is helpful.  Goal is not to get so angry.  Plans to laugh things off.  No discharge plans. A:  Medications administered per MD orders.  Emotional support and encouragement given patient. R:  Patient denied SI and HI, contracts for safety.  Denied A/V hallucinations.  Safety maintained with 15 minute checks.

## 2016-05-09 NOTE — [facility] Group Notes (Addendum)
Pt attended spiritual care group on grief and loss facilitated by chaplain Burnis KingfisherMatthew Amyia Lodwick   Group opened with brief discussion and psycho-social ed around grief and loss in relationships and in relation to self - identifying life patterns, circumstances, changes that cause losses. Established group norm of speaking from own life experience. Group goal of establishing open and affirming space for members to share loss and experience with grief, normalize grief experience and provide psycho social education and grief support.     Victoria Galvan was present throughout group.  Left group in final 5 minutes. Victoria Galvan was attentive during group,  Engaged briefly with another group member.  At the end of group, asked "how do you get over a loss" referred to two losses due to death, but was not specific about people she lost.    Another group member attempted to offer affirmation which Victoria Galvan did not experience as helpful.  Victoria Galvan left group.  Stated to facilitator when leaving "I can't do this in front of others"    SW reported that Victoria Galvan checked in at desk after leaving group.      Belva CromeStalnaker, Sansa Alkema Wayne MDiv

## 2016-05-09 NOTE — [facility] Suicide Risk Assessment (Addendum)
[facility] Discharge Suicide Risk Assessment   Principal Problem: Suicidal ideations Discharge Diagnoses:  Patient Active Problem List   Diagnosis Date Noted  . Suicidal ideations [R45.851]   . MDD (major depressive disorder), recurrent episode, severe (HCC) [F33.2] 05/03/2016  . GAD (generalized anxiety disorder) [F41.1] 05/13/2015  . Myalgia [M79.1] 08/07/2014  . Positive ANA (antinuclear antibody) [R76.8] 08/07/2014  . Hypothyroidism [E03.9] 08/07/2014    Total Time spent with patient: 30 minutes  Musculoskeletal: Strength & Muscle Tone: within normal limits Gait & Station: normal Patient leans: N/A  Psychiatric Specialty Exam: ROS patient denies headaches, denies shortness of breath or chest pain, no nausea or vomiting, no rash, no fever  Blood pressure 120/70, pulse 91, temperature 97.9 F (36.6 C), temperature source Oral, resp. rate 16, height 5\' 1"  (1.549 m), weight 55.8 kg (123 lb).Body mass index is 23.24 kg/m.  General Appearance: improved grooming   Eye Contact::  Good  Speech:  Normal Rate409  Volume:  Normal  Mood:  improved mood, denies depression   Affect:  Appropriate and reactive   Thought Process:  Linear  Orientation:  Full (Time, Place, and Person)  Thought Content:  no hallucinations, no delusions, not internally preoccupied   Suicidal Thoughts:  No denies any suicidal or self injurious ideations  Homicidal Thoughts:  No denies any violent ideations  Memory:  recent and remote grossly intact   Judgement:  Other:  improving   Insight:  improving   Psychomotor Activity:  Normal- no tremors, no diaphoresis, no restlessness   Concentration:  Good  Recall:  Good  Fund of Knowledge:Good  Language: Good  Akathisia:  Negative  Handed:  Right  AIMS (if indicated):     Assets:  Communication Skills Desire for Improvement Resilience  Sleep:  Number of Hours: 6.75  Cognition: WNL  ADL's:  Intact   Mental Status Per Nursing Assessment::   On Admission:   NA  Demographic Factors:  46 year old female   Loss Factors: Recent anniversary of sister's death  Historical Factors: History of anxiety, depression. History of long term benzodiazepine use, denies abusing above what was prescribed  Risk Reduction Factors:   Sense of responsibility to family, Living with another person, especially a relative and Positive coping skills or problem solving skills  Continued Clinical Symptoms:  At this time significantly improved compared to admission . Mood improved, affect brighter, no SI, no psychotic symptoms. Denies any lingering withdrawal symptoms. No tremors, no diaphoresis, no restlessness, vitals stable  Tolerating medications well - denies medication side effects- titrate Cymbalta to 40 mgrs QDAY    Cognitive Features That Contribute To Risk:  No gross cognitive deficits noted upon discharge. Is alert , attentive, and oriented x 3   Suicide Risk:  Mild:  Suicidal ideation of limited frequency, intensity, duration, and specificity.  There are no identifiable plans, no associated intent, mild dysphoria and related symptoms, good self-control (both objective and subjective assessment), few other risk factors, and identifiable protective factors, including available and accessible social support.  Follow-up Information    [facility] Follow up.   Why:  CSW attempted to call and confirm your scheduled appointment for medication management and left voicemail. Please call your provider to confirm your appointment. Contact information: [address] [place], [place] [address] Tel: [phone] Fax: [phone]       [facility] Follow up on 05/15/2016.   Why:  at 2:00pm for therapy with Abel Presto. Contact information: [address] [place] [address] [place] [address] [phone]           Plan Of Care/Follow-up recommendations:  Activity:  as tolerated  Diet:  regular Tests:  NA Other:   See below  Patient is requesting discharge and there are no grounds for involuntary commitment at this time She is leaving unit in good spirits  Plans to return home Follow up as above Follow up with PCP for medical issues as needed  Patient expresses high level of motivation in maintaining abstinence from alcohol and not resuming BZD management- states " I feel better off it".   Nehemiah MassedOBOS, Trystin Hargrove, MD 05/09/2016, 11:41 AM

## 2016-05-09 NOTE — Discharge Summary (Signed)
Physician Discharge Summary Note  Patient:  Victoria Galvan is an 46 y.o., female MRN:  [id number] DOB:  04/23/1971 Patient phone:  [phone] (home)  Patient address:   [address] [place] [address],  Total Time spent with patient: 30 minutes  Date of Admission:  05/03/2016 Date of Discharge: 05/09/2016  Reason for Admission:  Suicidal attempt after heavy drinking  Principal Problem: Suicidal ideations Discharge Diagnoses: Patient Active Problem List   Diagnosis Date Noted  . Suicidal ideations [R45.851]   . MDD (major depressive disorder), recurrent episode, severe (HCC) [F33.2] 05/03/2016  . GAD (generalized anxiety disorder) [F41.1] 05/13/2015  . Myalgia [M79.1] 08/07/2014  . Positive ANA (antinuclear antibody) [R76.8] 08/07/2014  . Hypothyroidism [E03.9] 08/07/2014    Past Psychiatric History: see HPI  Past Medical History:  Past Medical History:  Diagnosis Date  . Anxiety disorder    with panic  . Cervical spinal stenosis    spondylosis  . Chronic nausea    likely from chronic narcotic pain med use  . Chronic neck pain   . Colon polyps    Pt states "colon polyp was removed when they did my hysterectomy"--need old records to veriffy.  . Connective tissue disease (HCC)    Dr. Malena CatholicHawkes--rheum.  Started plaquenil 09/2014.  . Fibromyalgia    per pt report  . History of cervical cancer   . History of frequent urinary tract infections   . Hypothyroidism    med noncompliance  . Positive ANA (antinuclear antibody)   . Tobacco dependence     Past Surgical History:  Procedure Laterality Date  . Adhesions removed  2008  . ESOPHAGOGASTRODUODENOSCOPY  2005   mild gastritis; h pylori neg.  Says she has had esoph dilation several times  . holter monitor  2004   Normal  . MRI brain  2002;2004   Normal  . OOPHORECTOMY  2007   right-path neg  . OOPHORECTOMY  2012   left  . TONSILLECTOMY  1978  . TRANSTHORACIC ECHOCARDIOGRAM  2004   Normal  . VAGINAL HYSTERECTOMY   1999   for cervical cancer per pt   Family History:  Family History  Problem Relation Age of Onset  . Arthritis Mother   . Cancer Mother     Lung  . Arthritis Father    Family Psychiatric  History: see HPI Social History:  History  Alcohol Use  . 0.0 oz/week    Comment: occasionally     History  Drug Use No    Social History   Social History  . Marital status: Married    Spouse name: N/A  . Number of children: 2  . Years of education: College   Occupational History  . CNA Unemployed   Social History Main Topics  . Smoking status: Current Every Day Smoker    Packs/day: 0.50    Types: Cigarettes  . Smokeless tobacco: Never Used  . Alcohol use 0.0 oz/week     Comment: occasionally  . Drug use: No  . Sexual activity: Yes    Birth control/ protection: Surgical   Other Topics Concern  . None   Social History Narrative   Married.   Education: GED, Copywriter, advertisingCNA training.   Occupation: unemployed, formerly worked as a LawyerCNA.    Hospital Course:  Victoria Drossva Tetreault, 46 year old female was brought in after reportedly putting a gun to her head after a period of heavy drinking.    Victoria Galvan was admitted for Suicidal ideations and  crisis management.  Patient was treated with medications with their indications listed below in detail under Medication List.  Medical problems were identified and treated as needed.  Home medications were restarted as appropriate.  Improvement was monitored by observation and Victoria Galvan daily report of symptom reduction.  Emotional and mental status was monitored by daily self inventory reports completed by Victoria Galvan and clinical staff.  Patient reported continued improvement, denied any new concerns.  Patient had been compliant on medications and denied side effects.  Support and encouragement was provided.         Victoria Galvan was evaluated by the treatment team for stability and plans for continued recovery upon discharge.  Patient was offered further  treatment options upon discharge including Residential, Intensive Outpatient and Outpatient treatment. Patient will follow up with agency listed below for medication management and counseling.  Encouraged patient to maintain satisfactory support network and home environment.  Advised to adhere to medication compliance and outpatient treatment follow up.  Prescriptions provided.       Victoria Galvan motivation was an integral factor for scheduling further treatment.  Employment, transportation, bed availability, health status, family support, and any pending legal issues were also considered during patient's hospital stay.  Upon completion of this admission the patient was both mentally and medically stable for discharge denying suicidal/homicidal ideation, auditory/visual/tactile hallucinations, delusional thoughts and paranoia.      Physical Findings: AIMS: Facial and Oral Movements Muscles of Facial Expression: None, normal Lips and Perioral Area: None, normal Jaw: None, normal Tongue: None, normal,Extremity Movements Upper (arms, wrists, hands, fingers): None, normal Lower (legs, knees, ankles, toes): None, normal, Trunk Movements Neck, shoulders, hips: None, normal, Overall Severity Severity of abnormal movements (highest score from questions above): None, normal Incapacitation due to abnormal movements: None, normal Patient's awareness of abnormal movements (rate only patient's report): No Awareness, Dental Status Current problems with teeth and/or dentures?: No Does patient usually wear dentures?: No  CIWA:  CIWA-Ar Total: 1 COWS:  COWS Total Score: 2  Musculoskeletal: Strength & Muscle Tone: within normal limits Gait & Station: normal Patient leans: N/A  Psychiatric Specialty Exam:  See MD SRA Physical Exam  Nursing note and vitals reviewed.   ROS  Blood pressure 120/70, pulse 91, temperature 97.9 F (36.6 C), temperature source Oral, resp. rate 16, height 5\' 1"  (1.549 m), weight  55.8 kg (123 lb).Body mass index is 23.24 kg/m.    Have you used any form of tobacco in the last 30 days? (Cigarettes, Smokeless Tobacco, Cigars, and/or Pipes): Yes  Has this patient used any form of tobacco in the last 30 days? (Cigarettes, Smokeless Tobacco, Cigars, and/or Pipes) Yes, Rx given to patient  Blood Alcohol level:  Lab Results  Component Value Date   ETH 264 (H) 05/02/2016    Metabolic Disorder Labs:  Lab Results  Component Value Date   HGBA1C 5.2 05/05/2016   MPG 103 05/05/2016   No results found for: PROLACTIN Lab Results  Component Value Date   CHOL 198 05/05/2016   TRIG 92 05/05/2016   HDL 51 05/05/2016   CHOLHDL 3.9 05/05/2016   VLDL 18 05/05/2016   LDLCALC 129 (H) 05/05/2016    See Psychiatric Specialty Exam and Suicide Risk Assessment completed by Attending Physician prior to discharge.  Discharge destination:  Home  Is patient on multiple antipsychotic therapies at discharge:  No   Has Patient had three or more failed trials of antipsychotic monotherapy by history:  No  Recommended Plan for Multiple Antipsychotic Therapies: NA   Allergies as of 05/09/2016      Reactions   Aspirin Other (See Comments)   Unknown rx; this allergy was simply noted in former PCP's records      Medication List    STOP taking these medications   alprazolam 2 MG tablet Commonly known as:  XANAX   levothyroxine 50 MCG tablet Commonly known as:  SYNTHROID, LEVOTHROID   Levothyroxine Sodium 25 MCG Caps   ondansetron 8 MG disintegrating tablet Commonly known as:  ZOFRAN-ODT   Oxycodone HCl 10 MG Tabs   PARoxetine 20 MG tablet Commonly known as:  PAXIL     TAKE these medications     Indication  DULoxetine HCl 40 MG Cpep Take 40 mg by mouth daily. Start taking on:  05/10/2016  Indication:  Generalized Anxiety Disorder   gabapentin 300 MG capsule Commonly known as:  NEURONTIN Take 1 capsule (300 mg total) by mouth 4 (four) times daily - after meals and  at bedtime. What changed:  when to take this  Indication:  Agitation   hydrOXYzine 50 MG tablet Commonly known as:  ATARAX/VISTARIL Take 1 tablet (50 mg total) by mouth every 6 (six) hours as needed for anxiety.  Indication:  Anxiety Neurosis   nicotine 21 mg/24hr patch Commonly known as:  NICODERM CQ - dosed in mg/24 hours Place 1 patch (21 mg total) onto the skin daily. Start taking on:  05/10/2016  Indication:  Nicotine Addiction   traZODone 100 MG tablet Commonly known as:  DESYREL Take 1 tablet (100 mg total) by mouth at bedtime.  Indication:  Trouble Sleeping      Follow-up Information    [facility] Follow up.   Why:  CSW attempted to call and confirm your scheduled appointment for medication management and left voicemail. Please call your provider to confirm your appointment. Contact information: [address] [place], [place] [address] Tel: [phone] Fax: [phone]       [facility] Follow up on 05/15/2016.   Why:  at 2:00pm for therapy with Abel Presto. Contact information: [address] [place] [address] [phone]           Follow-up recommendations:  Activity:  as tol Diet:  as tol  Comments:  1.  Take all your medications as prescribed.   2.  Report any adverse side effects to outpatient provider. 3.  Patient instructed to not use alcohol or illegal drugs while on prescription medicines. 4.  In the event of worsening symptoms, instructed patient to call 911, the crisis hotline or go to nearest emergency room for evaluation of symptoms.  Signed: Lindwood Qua, NP [facility] 05/09/2016, 2:07 PM   Patient seen, Suicide Assessment Completed.  Disposition Plan Reviewed

## 2016-05-09 NOTE — Progress Notes (Signed)
  [facility] :  Will you be returning to the same living situation after discharge:  Yes,  Pt returning home At discharge, do you have transportation home?: Yes,  Pt family to pick up Do you have the ability to pay for your medications: Yes,  Pt provided with prescriptions  Release of information consent forms completed and in the chart;  Patient's signature needed at discharge.  Patient to Follow up at: Follow-up Information    [facility] Follow up.   Why:  CSW attempted to call and confirm your scheduled appointment for medication management and left voicemail. Please call your provider to confirm your appointment. Contact information: [address] [place], [place] [address] Tel: [phone] Fax: [phone]       [facility] Follow up on 05/15/2016.   Why:  at 2:00pm for therapy with Abel Prestoonna Hood. Contact information: [address] [place] [place] [address] [phone]           Next level of care provider has [facility] to [facility] Link:no  Safety Planning and Suicide Prevention discussed: Yes,  with husband; see SPE note  Have you used any form of tobacco in the last 30 days? (Cigarettes, Smokeless Tobacco, Cigars, and/or Pipes): Yes  Has patient been referred to the Quitline?: Patient refused referral  Patient has been referred for addiction treatment: Yes  Verdene LennertLauren C George Alcantar 05/09/2016, 10:53 AM

## 2016-05-09 NOTE — Progress Notes (Signed)
Recreation Therapy Notes  Animal-Assisted Activity (AAA) Program Checklist/Progress Notes Patient Eligibility Criteria Checklist & Daily Group note for Rec TxIntervention  Date: 01.16.2018 Time: 2:45pm [address]    AAA/T Program Assumption of Risk Form signed by Patient/ or Parent Legal Guardian Yes  Patient is free of allergies or sever asthma Yes  Patient reports no fear of animals Yes  Patient reports no history of cruelty to animals Yes  Patient understands his/her participation is voluntary Yes  Patient washes hands before animal contact Yes  Patient washes hands after animal contact Yes  Behavioral Response: Engaged, Appropriate   Education:Hand Washing, Appropriate Animal Interaction   Education Outcome: Acknowledges education.   Clinical Observations/Feedback: Patient attended session and interacted appropriately with therapy dog and peers.    Marykay Lexenise L Byrdie Miyazaki, LRT/CTRS        Tamya Denardo L 05/09/2016 3:09 PM

## 2016-05-30 ENCOUNTER — Other Ambulatory Visit (HOSPITAL_COMMUNITY): Payer: Self-pay | Admitting: Psychiatry

## 2016-06-19 ENCOUNTER — Other Ambulatory Visit (HOSPITAL_COMMUNITY): Payer: Self-pay | Admitting: Psychiatry

## 2016-09-20 IMAGING — US US TRANSVAGINAL NON-OB
1 series · 14 of 25 positions shown · non-contrast
Comparison: None

CLINICAL DATA: Abdominal mass.  Hysterectomy.



[Series 1: us transvaginal non-ob · 0.18mm/px · 14 of 71 slices shown]
[im 1/71]
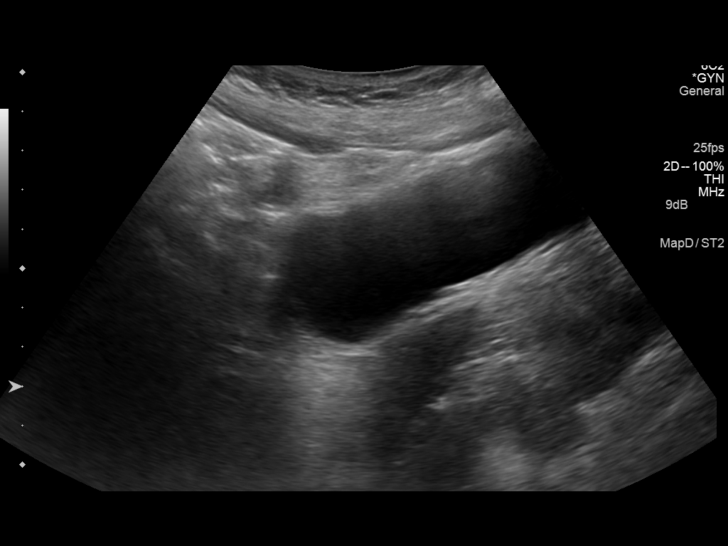
[im 6/71]
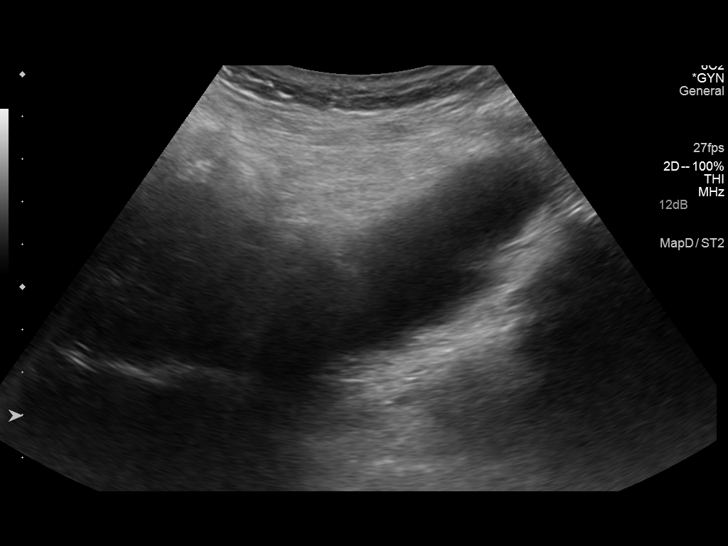
[im 12/71]
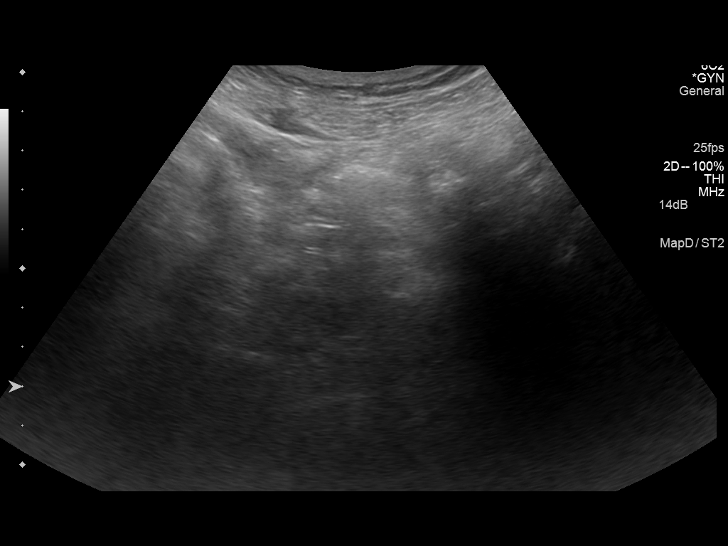
[im 18/71]
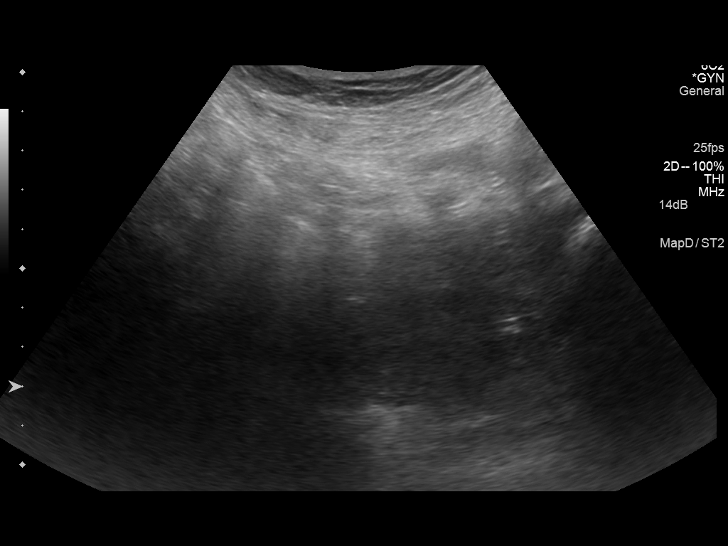
[im 24/71]
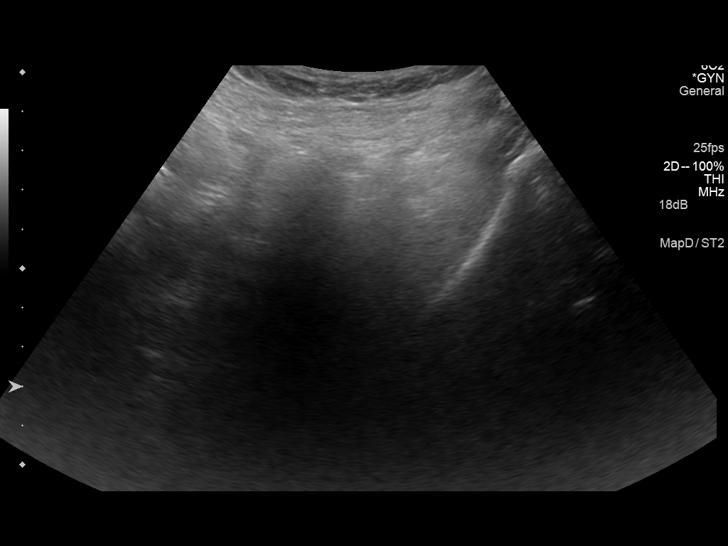
[im 27/71]
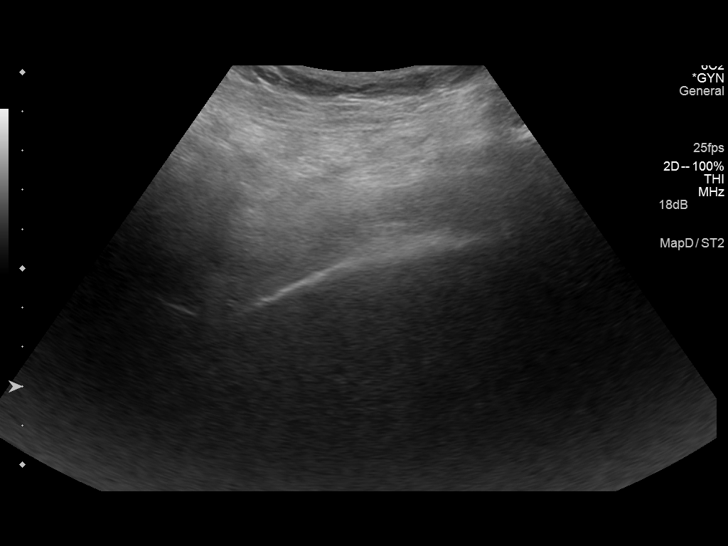
[im 33/71]
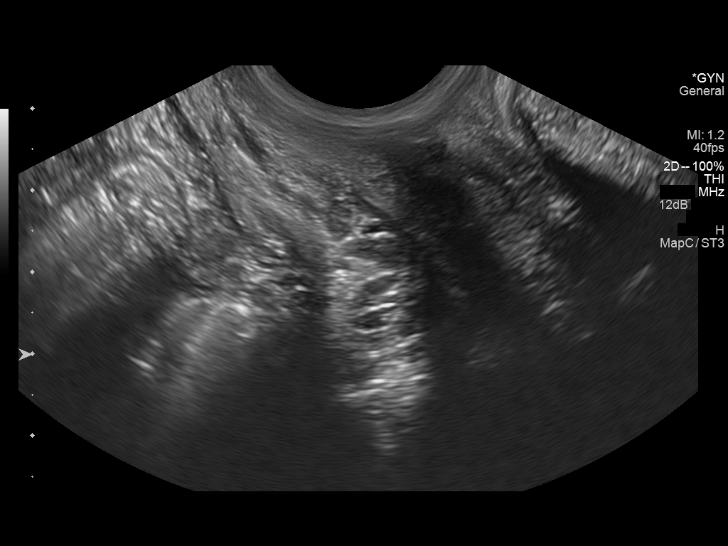
[im 38/71]
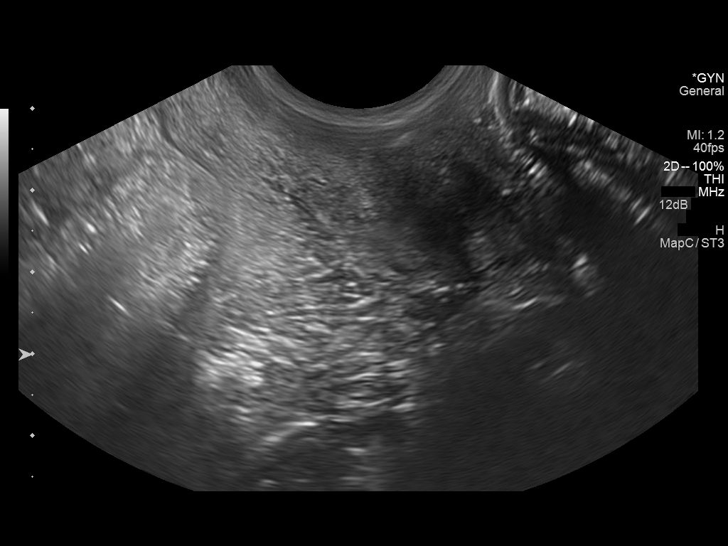
[im 44/71]
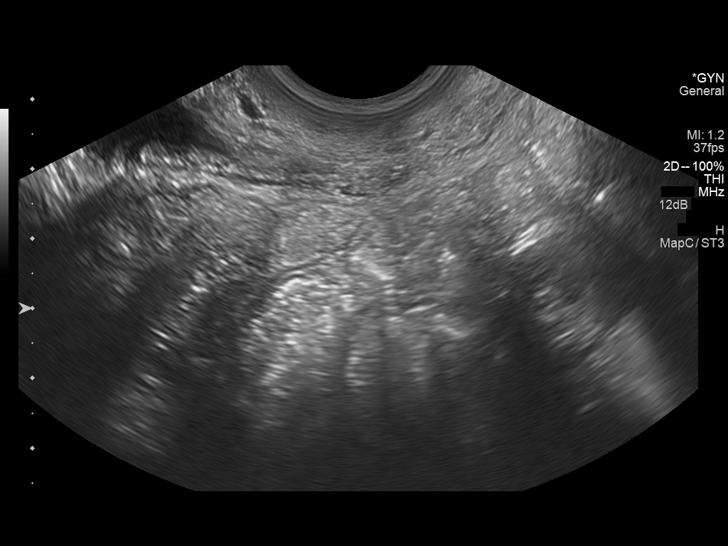
[im 47/71]
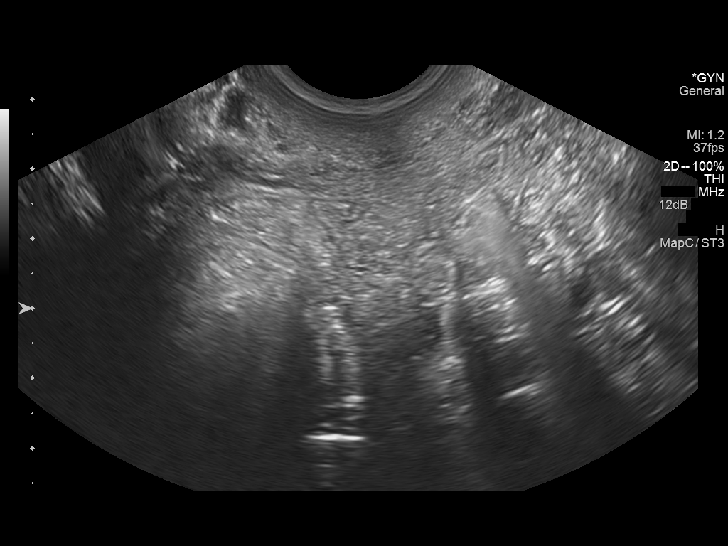
[im 53/71]
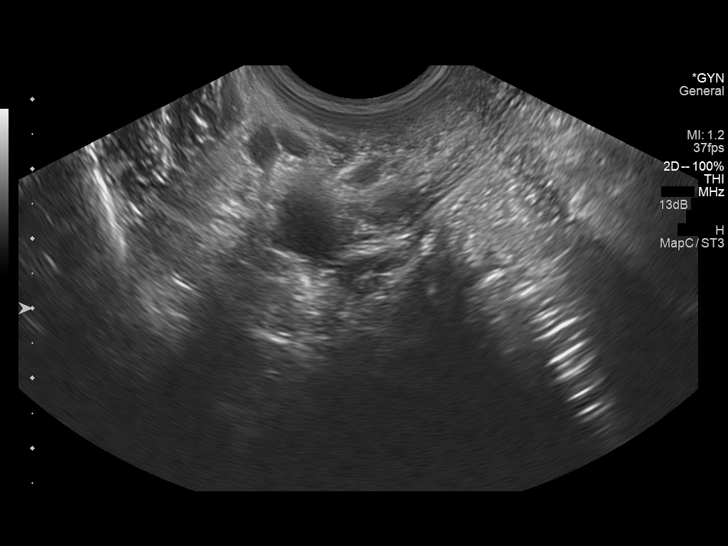
[im 59/71]
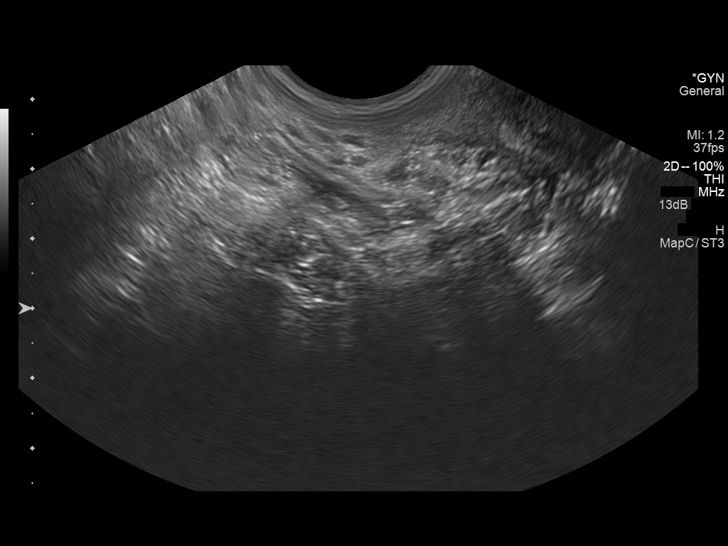
[im 65/71]
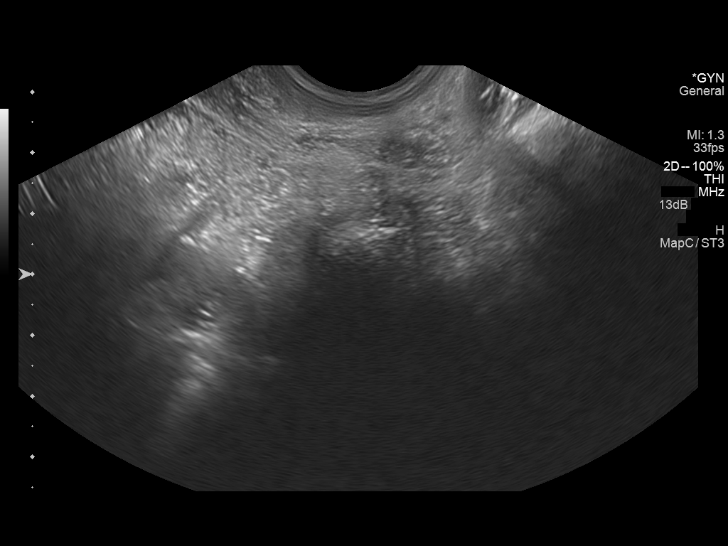
[im 71/71]
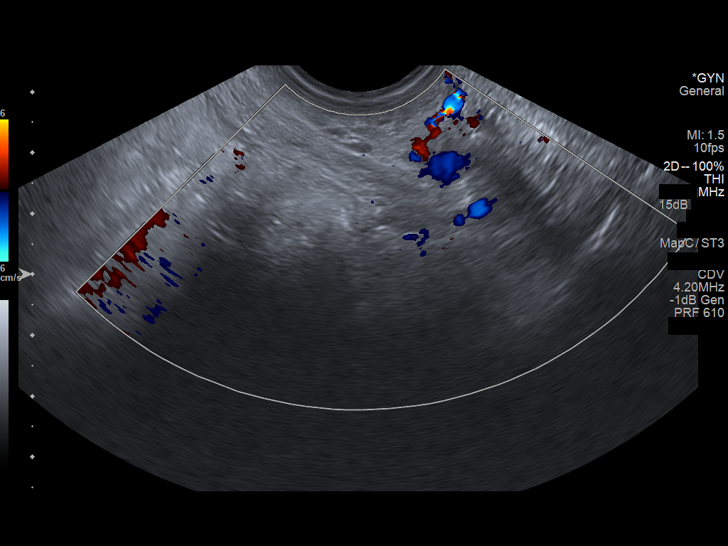

[14 of 25 positions shown; findings below may reference images not displayed]

FINDINGS: Uterus

Hysterectomy.

Right ovary

Right oophorectomy.

Left ovary

Left oophorectomy.

Other findings

No free fluid.  Bowel gas is prominent.  This limits exam.
IMPRESSION: Hysterectomy and bilateral oophorectomy. No mass lesion identified.
No pathologic pelvic fluid collections. Bowel gas is prominent
limiting exam.

## 2017-03-11 ENCOUNTER — Emergency Department (HOSPITAL_COMMUNITY)
Admission: EM | Admit: 2017-03-11 | Discharge: 2017-03-12 | Disposition: A | Discharge status: Other Acute Inpatient Hospital | Payer: Self-pay | Attending: Emergency Medicine | Admitting: Emergency Medicine

## 2017-03-11 ENCOUNTER — Encounter (HOSPITAL_COMMUNITY): Payer: Self-pay

## 2017-03-11 ENCOUNTER — Other Ambulatory Visit: Payer: Self-pay

## 2017-03-11 DIAGNOSIS — Z8541 Personal history of malignant neoplasm of cervix uteri: Secondary | ICD-10-CM | POA: Insufficient documentation

## 2017-03-11 DIAGNOSIS — Z79899 Other long term (current) drug therapy: Secondary | ICD-10-CM | POA: Insufficient documentation

## 2017-03-11 DIAGNOSIS — F329 Major depressive disorder, single episode, unspecified: Secondary | ICD-10-CM | POA: Insufficient documentation

## 2017-03-11 DIAGNOSIS — E039 Hypothyroidism, unspecified: Secondary | ICD-10-CM | POA: Insufficient documentation

## 2017-03-11 DIAGNOSIS — R45851 Suicidal ideations: Secondary | ICD-10-CM | POA: Insufficient documentation

## 2017-03-11 DIAGNOSIS — Z046 Encounter for general psychiatric examination, requested by authority: Secondary | ICD-10-CM | POA: Insufficient documentation

## 2017-03-11 DIAGNOSIS — F17210 Nicotine dependence, cigarettes, uncomplicated: Secondary | ICD-10-CM | POA: Insufficient documentation

## 2017-03-11 HISTORY — DX: Depression, unspecified: F32.A

## 2017-03-11 HISTORY — DX: Major depressive disorder, single episode, unspecified: F32.9

## 2017-03-11 NOTE — ED Triage Notes (Signed)
Pt reports SI that started months ago. Pt reports being treated for bipolar/depression back in January, then stopped b/c thought she was better. Pt reports attempting SI in January by holding 38 gun to head, police were able to take gun away. Pt reports trying to suffocate herself last night in sleep.

## 2017-03-12 ENCOUNTER — Inpatient Hospital Stay
Admit: 2017-03-12 | Discharge: 2017-03-21 | DRG: 885 | Disposition: A | Discharge status: Home / Self Care | Payer: No Typology Code available for payment source | Attending: Psychiatry | Admitting: Psychiatry

## 2017-03-12 ENCOUNTER — Emergency Department (HOSPITAL_COMMUNITY): Payer: Self-pay

## 2017-03-12 DIAGNOSIS — E785 Hyperlipidemia, unspecified: Secondary | ICD-10-CM | POA: Diagnosis present

## 2017-03-12 DIAGNOSIS — Z91120 Patient's intentional underdosing of medication regimen due to financial hardship: Secondary | ICD-10-CM | POA: Diagnosis not present

## 2017-03-12 DIAGNOSIS — Z79890 Hormone replacement therapy (postmenopausal): Secondary | ICD-10-CM | POA: Diagnosis not present

## 2017-03-12 DIAGNOSIS — T43216A Underdosing of selective serotonin and norepinephrine reuptake inhibitors, initial encounter: Secondary | ICD-10-CM | POA: Diagnosis present

## 2017-03-12 DIAGNOSIS — E039 Hypothyroidism, unspecified: Secondary | ICD-10-CM | POA: Diagnosis present

## 2017-03-12 DIAGNOSIS — F909 Attention-deficit hyperactivity disorder, unspecified type: Secondary | ICD-10-CM | POA: Diagnosis present

## 2017-03-12 DIAGNOSIS — F411 Generalized anxiety disorder: Secondary | ICD-10-CM | POA: Diagnosis present

## 2017-03-12 DIAGNOSIS — M542 Cervicalgia: Secondary | ICD-10-CM | POA: Diagnosis present

## 2017-03-12 DIAGNOSIS — F17210 Nicotine dependence, cigarettes, uncomplicated: Secondary | ICD-10-CM | POA: Diagnosis present

## 2017-03-12 DIAGNOSIS — M4802 Spinal stenosis, cervical region: Secondary | ICD-10-CM | POA: Diagnosis present

## 2017-03-12 DIAGNOSIS — R110 Nausea: Secondary | ICD-10-CM | POA: Diagnosis present

## 2017-03-12 DIAGNOSIS — E781 Pure hyperglyceridemia: Secondary | ICD-10-CM | POA: Diagnosis present

## 2017-03-12 DIAGNOSIS — F17200 Nicotine dependence, unspecified, uncomplicated: Secondary | ICD-10-CM | POA: Diagnosis present

## 2017-03-12 DIAGNOSIS — R45851 Suicidal ideations: Secondary | ICD-10-CM | POA: Diagnosis present

## 2017-03-12 DIAGNOSIS — R21 Rash and other nonspecific skin eruption: Secondary | ICD-10-CM | POA: Diagnosis not present

## 2017-03-12 DIAGNOSIS — G894 Chronic pain syndrome: Secondary | ICD-10-CM | POA: Diagnosis present

## 2017-03-12 DIAGNOSIS — F333 Major depressive disorder, recurrent, severe with psychotic symptoms: Secondary | ICD-10-CM | POA: Diagnosis present

## 2017-03-12 DIAGNOSIS — Z886 Allergy status to analgesic agent status: Secondary | ICD-10-CM | POA: Diagnosis not present

## 2017-03-12 DIAGNOSIS — D72829 Elevated white blood cell count, unspecified: Secondary | ICD-10-CM | POA: Diagnosis present

## 2017-03-12 DIAGNOSIS — Z818 Family history of other mental and behavioral disorders: Secondary | ICD-10-CM | POA: Diagnosis not present

## 2017-03-12 DIAGNOSIS — K219 Gastro-esophageal reflux disease without esophagitis: Secondary | ICD-10-CM | POA: Diagnosis present

## 2017-03-12 DIAGNOSIS — Z9119 Patient's noncompliance with other medical treatment and regimen: Secondary | ICD-10-CM | POA: Diagnosis not present

## 2017-03-12 DIAGNOSIS — G4700 Insomnia, unspecified: Secondary | ICD-10-CM | POA: Diagnosis present

## 2017-03-12 DIAGNOSIS — F410 Panic disorder [episodic paroxysmal anxiety] without agoraphobia: Secondary | ICD-10-CM | POA: Diagnosis present

## 2017-03-12 DIAGNOSIS — Z79899 Other long term (current) drug therapy: Secondary | ICD-10-CM | POA: Diagnosis not present

## 2017-03-12 LAB — COMPREHENSIVE METABOLIC PANEL
ALK PHOS: 115 U/L (ref 38–126)
ALT: 10 U/L — ABNORMAL LOW (ref 14–54)
AST: 20 U/L (ref 15–41)
Albumin: 4.8 g/dL (ref 3.5–5.0)
Anion gap: 10 (ref 5–15)
BUN: 17 mg/dL (ref 6–20)
CO2: 24 mmol/L (ref 22–32)
Calcium: 9.8 mg/dL (ref 8.9–10.3)
Chloride: 103 mmol/L (ref 101–111)
Creatinine, Ser: 0.85 mg/dL (ref 0.44–1.00)
GFR calc Af Amer: >60 mL/min (ref >60)
Glucose, Bld: 93 mg/dL (ref 65–99)
Potassium: 3.6 mmol/L (ref 3.5–5.1)
Sodium: 137 mmol/L (ref 135–145)
TOTAL PROTEIN: 8.2 g/dL — AB (ref 6.5–8.1)
Total Bilirubin: 0.5 mg/dL (ref 0.3–1.2)

## 2017-03-12 LAB — URINALYSIS, ROUTINE W REFLEX MICROSCOPIC
BACTERIA UA: NONE SEEN
Bilirubin Urine: NEGATIVE
GLUCOSE, UA: NEGATIVE mg/dL
Ketones, ur: NEGATIVE mg/dL
Leukocytes, UA: NEGATIVE
Nitrite: NEGATIVE
PH: 6 (ref 5.0–8.0)
Protein, ur: NEGATIVE mg/dL
SPECIFIC GRAVITY, URINE: 1.015 (ref 1.005–1.030)

## 2017-03-12 LAB — RAPID URINE DRUG SCREEN, HOSP PERFORMED
AMPHETAMINES: NOT DETECTED
Barbiturates: NOT DETECTED
Benzodiazepines: NOT DETECTED
Cocaine: NOT DETECTED
Opiates: POSITIVE — AB
Tetrahydrocannabinol: NOT DETECTED

## 2017-03-12 LAB — CBC
HCT: 42.5 % (ref 36.0–46.0)
Hemoglobin: 14.3 g/dL (ref 12.0–15.0)
MCH: 30 pg (ref 26.0–34.0)
MCHC: 33.6 g/dL (ref 30.0–36.0)
MCV: 89.3 fL (ref 78.0–100.0)
PLATELETS: 376 10*3/uL (ref 150–400)
RBC: 4.76 MIL/uL (ref 3.87–5.11)
RDW: 13.3 % (ref 11.5–15.5)
WBC: 22 10*3/uL — ABNORMAL HIGH (ref 4.0–10.5)

## 2017-03-12 LAB — DIFFERENTIAL
Basophils Absolute: 0.1 10*3/uL (ref 0.0–0.1)
Basophils Relative: 0 %
EOS ABS: 0.6 10*3/uL (ref 0.0–0.7)
Eosinophils Relative: 3 %
LYMPHS ABS: 6.3 10*3/uL — AB (ref 0.7–4.0)
LYMPHS PCT: 27 %
MONOS PCT: 10 %
Monocytes Absolute: 2.3 10*3/uL — ABNORMAL HIGH (ref 0.1–1.0)
NEUTROS ABS: 14.4 10*3/uL — AB (ref 1.7–7.7)
Neutrophils Relative %: 60 %

## 2017-03-12 LAB — ETHANOL

## 2017-03-12 LAB — ACETAMINOPHEN LEVEL: Acetaminophen (Tylenol), Serum: <10 ug/mL — ABNORMAL LOW (ref 10–30)

## 2017-03-12 LAB — SALICYLATE LEVEL: Salicylate Lvl: <7 mg/dL (ref 2.8–30.0)

## 2017-03-12 MED ORDER — ACETAMINOPHEN 325 MG PO TABS
650.0000 mg | ORAL_TABLET | ORAL | Status: DC | PRN
Start: 2017-03-12 — End: 2017-03-12
  Administered 2017-03-12: 650 mg via ORAL
  Filled 2017-03-12: qty 2

## 2017-03-12 MED ORDER — LEVOTHYROXINE SODIUM 50 MCG PO TABS
50.0000 ug | ORAL_TABLET | Freq: Every day | ORAL | Status: DC
  Administered 2017-03-12: 50 ug via ORAL
  Filled 2017-03-12: qty 1

## 2017-03-12 MED ORDER — MAGNESIUM HYDROXIDE 400 MG/5ML PO SUSP: 30.0000 mL | Freq: Every day | ORAL | Status: DC | PRN

## 2017-03-12 MED ORDER — ONDANSETRON HCL 4 MG PO TABS: 4.0000 mg | ORAL_TABLET | Freq: Three times a day (TID) | ORAL | Status: DC | PRN

## 2017-03-12 MED ORDER — HYDROXYZINE HCL 50 MG PO TABS: 50.0000 mg | ORAL_TABLET | Freq: Three times a day (TID) | ORAL | Status: DC | PRN

## 2017-03-12 MED ORDER — ACETAMINOPHEN 325 MG PO TABS: 650.0000 mg | ORAL_TABLET | Freq: Four times a day (QID) | ORAL | Status: DC | PRN

## 2017-03-12 MED ORDER — DULOXETINE HCL 60 MG PO CPEP: 60.0000 mg | ORAL_CAPSULE | Freq: Every day | ORAL | Status: DC

## 2017-03-12 MED ORDER — TRAZODONE HCL 100 MG PO TABS: 100.0000 mg | ORAL_TABLET | Freq: Every day | ORAL | Status: DC

## 2017-03-12 MED ORDER — ALUM & MAG HYDROXIDE-SIMETH 200-200-20 MG/5ML PO SUSP: 30.0000 mL | ORAL | Status: DC | PRN

## 2017-03-12 MED ORDER — LEVOTHYROXINE SODIUM 100 MCG PO TABS
100.0000 ug | ORAL_TABLET | Freq: Every day | ORAL | Status: DC
  Administered 2017-03-13 – 2017-03-21 (×8): 100 ug via ORAL
  Filled 2017-03-12 (×9): qty 1

## 2017-03-12 MED ORDER — GABAPENTIN 600 MG PO TABS: 300.0000 mg | ORAL_TABLET | Freq: Three times a day (TID) | ORAL | Status: DC

## 2017-03-12 MED ORDER — ALUM & MAG HYDROXIDE-SIMETH 200-200-20 MG/5ML PO SUSP: 30.0000 mL | Freq: Four times a day (QID) | ORAL | Status: DC | PRN

## 2017-03-12 MED ORDER — ZOLPIDEM TARTRATE 5 MG PO TABS: 5.0000 mg | ORAL_TABLET | Freq: Every evening | ORAL | Status: DC | PRN

## 2017-03-12 MED ORDER — GABAPENTIN 300 MG PO CAPS
300.0000 mg | ORAL_CAPSULE | Freq: Three times a day (TID) | ORAL | Status: DC
  Administered 2017-03-13 – 2017-03-21 (×18): 300 mg via ORAL
  Filled 2017-03-12 (×12): qty 1
  Filled 2017-03-12: qty 3
  Filled 2017-03-12 (×2): qty 1
  Filled 2017-03-12 (×2): qty 3
  Filled 2017-03-12 (×4): qty 1

## 2017-03-12 MED ORDER — GABAPENTIN 300 MG PO CAPS: 600.0000 mg | ORAL_CAPSULE | Freq: Every day | ORAL | Status: DC

## 2017-03-12 MED ORDER — OXYCODONE-ACETAMINOPHEN 5-325 MG PO TABS
2.0000 | ORAL_TABLET | Freq: Three times a day (TID) | ORAL | Status: DC | PRN
  Administered 2017-03-13: 2 via ORAL
  Filled 2017-03-12: qty 2

## 2017-03-12 MED ORDER — NICOTINE 21 MG/24HR TD PT24: 21.0000 mg | MEDICATED_PATCH | Freq: Every day | TRANSDERMAL | Status: DC

## 2017-03-12 NOTE — Tx Team (Signed)
Initial Treatment Plan 03/12/2017 8:06 PM Victoria RoyalsEva I Derousse [id number]    PATIENT STRESSORS: Financial difficulties Health problems Loss of Sister and Nephew Traumatic event   PATIENT STRENGTHS: Active sense of humor Average or above average intelligence Capable of independent living Communication skills Motivation for treatment/growth Supportive family/friends   PATIENT IDENTIFIED PROBLEMS: Suicidal Ideations 03/12/17  Depression 03/12/17  Anxiety 03/12/17   Ineffective Coping Skills 03/12/17   Grief 03/12/17             DISCHARGE CRITERIA:  Improved stabilization in mood, thinking, and/or behavior Motivation to continue treatment in a less acute level of care Reduction of life-threatening or endangering symptoms to within safe limits Safe-care adequate arrangements made Verbal commitment to aftercare and medication compliance  PRELIMINARY DISCHARGE PLAN: Attend aftercare/continuing care group Return to previous living arrangement Return to previous work or school arrangements  PATIENT/FAMILY INVOLVEMENT: This treatment plan has been presented to and reviewed with the patient, Victoria Galvan.  The patient and family have been given the opportunity to ask questions and make suggestions.  Berkley HarveySlade I Mariano Doshi, RN 03/12/2017, 8:06 PM

## 2017-03-12 NOTE — BH Assessment (Signed)
[facility] been [facility].  Accepting physician is Dr. Jennet MaduroPucilowska.  Attending Physician will be Dr. Jennet MaduroPucilowska.  Patient has been assigned to room 324-A, [facility] Charge Nurse ThornwoodGwen F.  Call report to [phone].  Representative/Transfer Coordinator is Judeth Porchalvin & Camille Patient pre-admitted [facility] Vikki Ports(Valerie)  [facility] Staff Marni Griffon(Jolan W., TTS, Lillia AbedLindsay, [facility]) made aware of acceptance.

## 2017-03-12 NOTE — ED Notes (Signed)
TTS consult in progress. °

## 2017-03-12 NOTE — Progress Notes (Signed)
D: Pt has passive SI, reports thoughts of "wanting to die". Patient verbally contracts for safety. Pt is anxious and restless. Patient reports neck and back pain with a rating of 7/10.  Patient is cooperative, was able to eat meal and attend wrap up group. Patient remained in room for most of shift, asleep without complaint.   A: Q x 15 minute observation checks were completed for safety. Patient was provided with education on medications. Patient was offered support and encouragement. Patient  was encourage to attend groups, participate in unit activities and continue with plan of care.   R: Patient is receptive to treatment and safety maintained on unit.

## 2017-03-12 NOTE — ED Notes (Signed)
[facility], [facility] will look for other placement

## 2017-03-12 NOTE — ED Notes (Signed)
[facility] called earlier to give acceptance info.  [facility] rm [facility] by Dr.Pucilowska. Christy informed.

## 2017-03-12 NOTE — ED Notes (Signed)
Patient states she needs her pain medication. RN notified.

## 2017-03-12 NOTE — ED Notes (Signed)
Called [facility] rm [facility].

## 2017-03-12 NOTE — Progress Notes (Signed)
D: [facility], skin is intact, no contraband found. Patient is a Low Fall Risk. Patient has passive SI, contracts verbally for safety. Patient was brought to hospital by best friend. Patient has experienced multiple losses including her sister and nephew. Patient is anxious and depressed, is restless and complains of neck/back pain at this time.   A: Patient oriented to unit/room/call light. Patient was offered support and encouragement. Patient was encourage to attend groups, participate in unit activities and continue with plan of care. Q x 15 minute observation checks were completed for safety.   R: Patient is receptive to treatment and safety maintained on unit.

## 2017-03-12 NOTE — ED Provider Notes (Signed)
[facility] EMERGENCY DEPARTMENT Provider Note   CSN: [id number] Arrival date & time: 03/11/17  2331     History   Chief Complaint Chief Complaint  Patient presents with  . V70.1    HPI Victoria Galvan is a 46 y.o. female.  The history is provided by the patient.   46 year old female who presents with suicidal ideation.  She has a history of major depressive disorder and generalized anxiety disorder.  She reports several months of suicidal ideation and difficulty coping with depression since the death of 2 close family members about a year ago.  States that she has tried multiple times before in the past to try to hurt her self including once yesterday when she tried to suffocate herself.  States that she tried to tie a plastic bag around her head. She has also tried to cut herself in the past, overdose on Xanax, and point a gun to her head. Denies recent illness, such as fever, cough, shortness of breath, chest pain, abdominal pain, vomiting or diarrhea.  No illicit drug use or alcohol abuse. Past Medical History:  Diagnosis Date  . Anxiety disorder    with panic  . Cervical spinal stenosis    spondylosis  . Chronic nausea    likely from chronic narcotic pain med use  . Chronic neck pain   . Colon polyps    Pt states "colon polyp was removed when they did my hysterectomy"--need old records to veriffy.  . Connective tissue disease (HCC)    Dr. Malena CatholicHawkes--rheum.  Started plaquenil 09/2014.  Marland Kitchen. Depression   . Fibromyalgia    per pt report  . History of cervical cancer   . History of frequent urinary tract infections   . Hypothyroidism    med noncompliance  . Positive ANA (antinuclear antibody)   . Tobacco dependence     Patient Active Problem List   Diagnosis Date Noted  . Suicidal ideations   . MDD (major depressive disorder), recurrent episode, severe (HCC) 05/03/2016  . GAD (generalized anxiety disorder) 05/13/2015  . Myalgia 08/07/2014  . Positive ANA (antinuclear  antibody) 08/07/2014  . Hypothyroidism 08/07/2014    Past Surgical History:  Procedure Laterality Date  . Adhesions removed  2008  . ESOPHAGOGASTRODUODENOSCOPY  2005   mild gastritis; h pylori neg.  Says she has had esoph dilation several times  . holter monitor  2004   Normal  . MRI brain  2002;2004   Normal  . OOPHORECTOMY  2007   right-path neg  . OOPHORECTOMY  2012   left  . TONSILLECTOMY  1978  . TRANSTHORACIC ECHOCARDIOGRAM  2004   Normal  . VAGINAL HYSTERECTOMY  1999   for cervical cancer per pt    OB History    No data available       Home Medications    Prior to Admission medications   Medication Sig Start Date End Date Taking? Authorizing Provider  levothyroxine (SYNTHROID, LEVOTHROID) 100 MCG tablet Take 50 mcg daily before breakfast by mouth.   Yes [provider]  oxyCODONE-acetaminophen (PERCOCET) 10-325 MG tablet Take 1 tablet every 4 (four) hours as needed by mouth for pain (RA).   Yes [provider]  DULoxetine 40 MG CPEP Take 40 mg by mouth daily. 05/10/16   Adonis BrookAgustin, Sheila, NP  gabapentin (NEURONTIN) 300 MG capsule Take 1 capsule (300 mg total) by mouth 4 (four) times daily - after meals and at bedtime. Patient taking differently: Take 600 mg  at bedtime by mouth.  05/09/16   Adonis BrookAgustin, Sheila, NP  hydrOXYzine (ATARAX/VISTARIL) 50 MG tablet Take 1 tablet (50 mg total) by mouth every 6 (six) hours as needed for anxiety. 05/09/16   Adonis BrookAgustin, Sheila, NP  nicotine (NICODERM CQ - DOSED IN MG/24 HOURS) 21 mg/24hr patch Place 1 patch (21 mg total) onto the skin daily. 05/10/16   Adonis BrookAgustin, Sheila, NP  traZODone (DESYREL) 100 MG tablet Take 1 tablet (100 mg total) by mouth at bedtime. 05/09/16   Adonis BrookAgustin, Sheila, NP    Family History Family History  Problem Relation Age of Onset  . Arthritis Mother   . Cancer Mother        Lung  . Arthritis Father     Social History Social History   Tobacco Use  . Smoking status: Current Every Day Smoker     Packs/day: 0.50    Types: Cigarettes  . Smokeless tobacco: Never Used  Substance Use Topics  . Alcohol use: No    Alcohol/week: 0.0 oz    Frequency: Never    Comment: occasionally  . Drug use: No     Allergies   Aspirin   Review of Systems Review of Systems  Constitutional: Negative for fever.  Respiratory: Negative for cough.   Cardiovascular: Negative for chest pain.  Gastrointestinal: Negative for abdominal pain.  Psychiatric/Behavioral: Positive for self-injury.  All other systems reviewed and are negative.    Physical Exam Updated Vital Signs BP (!) 140/97 (BP Location: Right Arm)   Pulse 96   Temp 98.3 F (36.8 C) (Oral)   Resp 18   Ht 5\' 1"  (1.549 m)   Wt 63.5 kg (140 lb)   SpO2 100%   BMI 26.45 kg/m   Physical Exam Physical Exam  Nursing note and vitals reviewed. Constitutional: Well developed, well nourished, non-toxic, and in no acute distress Head: Normocephalic and atraumatic.  Mouth/Throat: Oropharynx is clear and moist.  Neck: Normal range of motion. Neck supple.  Cardiovascular: Normal rate and regular rhythm.   Pulmonary/Chest: Effort normal and breath sounds normal.  Abdominal: Soft. There is no tenderness. There is no rebound and no guarding.  Musculoskeletal: Normal range of motion.  Neurological: Alert, no facial droop, fluent speech, moves all extremities symmetrically Skin: Skin is warm and dry.  Psychiatric: Cooperative, flat affect, poor eye contact. No hallucinations or delusion. Normal judgment and insight.   ED Treatments / Results  Labs (all labs ordered are listed, but only abnormal results are displayed) Labs Reviewed  COMPREHENSIVE METABOLIC PANEL - Abnormal; Notable for the following components:      Result Value   Total Protein 8.2 (*)    ALT 10 (*)    All other components within normal limits  ACETAMINOPHEN LEVEL - Abnormal; Notable for the following components:   Acetaminophen (Tylenol), Serum <10 (*)    All other  components within normal limits  CBC - Abnormal; Notable for the following components:   WBC 22.0 (*)    All other components within normal limits  RAPID URINE DRUG SCREEN, HOSP PERFORMED - Abnormal; Notable for the following components:   Opiates POSITIVE (*)    All other components within normal limits  ETHANOL  SALICYLATE LEVEL  PREGNANCY, URINE    EKG  EKG Interpretation None       Radiology No results found.  Procedures Procedures (including critical care time)  Medications Ordered in ED Medications  acetaminophen (TYLENOL) tablet 650 mg (not administered)  zolpidem (AMBIEN) tablet 5 mg (not  administered)  ondansetron (ZOFRAN) tablet 4 mg (not administered)  alum & mag hydroxide-simeth (MAALOX/MYLANTA) 200-200-20 MG/5ML suspension 30 mL (not administered)  gabapentin (NEURONTIN) capsule 600 mg (not administered)  levothyroxine (SYNTHROID, LEVOTHROID) tablet 50 mcg (not administered)     Initial Impression / Assessment and Plan / ED Course  I have reviewed the triage vital signs and the nursing notes.  Pertinent labs & imaging results that were available during my care of the patient were reviewed by me and considered in my medical decision making (see chart for details).     46 year old female with history of GAD and MDD who presents with increased depression and suicidal ideation.  She is well-appearing in no acute distress with stable vital signs on presentation.  Exam overall nonfocal other than withdrawn and flat affect with poor eye contact. She is felt medically cleared for TTS consult. Medical screening blood work is obtained.  Patient is evaluated by TTS.  Inpatient psychiatric admission is recommended.  Placement is pending at this time.  Final Clinical Impressions(s) / ED Diagnoses   Final diagnoses:  Suicidal thoughts    ED Discharge Orders    None       Lavera Guise, MD 03/12/17 [id number]

## 2017-03-12 NOTE — BH Assessment (Addendum)
Tele Assessment Note   Patient Name: Victoria Galvan MRN: [id number] Referring Physician: Lavera Guiseana Duo Liu, MD Location of Patient:  [facility] Location of Provider: Behavioral Health TTS Department  Victoria Royalsva I Vanhecke is an 46 y.o. married female who presents unaccompanied to [facility] ED reporting symptoms of depression including suicidal ideation. Pt reports she attempted suicide within the past 24 hours by putting a bag over her head in an attempt to suffocate herself. Pt says someone intervene and stopped her. Pt reports she has attempted suicide three times in the past by overdosing on Xanax, cutting her wrist and putting a loaded gun to her head. Pt continues to report suicidal ideation and says "I don't wan to live anymore" and says she believes she and her family would all be better off if she were dead. SDhe says she cannot control her suicidal thoughts. Pt's medical record indicates she has a history of Major Depressive Disorder and Generalized Anxiety Disorder. Pt acknowledges symptoms including crying spells, social withdrawal, loss of interest in usual pleasures, fatigue, decreased concentration, decreased sleep and feelings of guilt and hopelessness. Pt reports she often feels anxious. She denies current homicidal ideation or history of aggression or violence. Pt denies any history of psychotic symptoms. Pt denies any history of alcohol or substance use, although she acknowledges she has used alcohol in the past as part of a suicide attempt.  Pt cannot identify any specific stressors. She says she worries about everyday things such as bills and the welfare of her family. She states she has chronic medical problems. She reports her depression became more intense last year following the death or her sister by heart attack followed by the death of her nephew two months later from an accidental drug overdose. Pt reports she lives with her husband and twenty-three-year-old son. Pt also has a daughter who  Pt identifies as her primary support. Pt says she is not employed. She denies access to firearms. Pt states she has no outpatient mental health providers and is not currently taking any psychiatric medication. [facility] after she threatened suicide by putting a gun to her head.  Pt is dressed in hospital scrubs, alert and oriented x4. Pt speaks in a clear tone, at moderate volume and normal pace. Motor behavior appears normal. Eye contact is good. Pt's mood is depressed and affect is congruent with mood. Thought process is coherent and relevant. There is no indication Pt is currently responding to internal stimuli or experiencing delusional thought content. Pt was calm and cooperative throughout assessment. She states she is willing to sign voluntarily into a psychiatric facility.    Diagnosis: Major Depressive Disorder, Recurrent, Severe Without Psychotic Features; Generalized Anxiety Disorder  Past Medical History:  Past Medical History:  Diagnosis Date  . Anxiety disorder    with panic  . Cervical spinal stenosis    spondylosis  . Chronic nausea    likely from chronic narcotic pain med use  . Chronic neck pain   . Colon polyps    Pt states "colon polyp was removed when they did my hysterectomy"--need old records to veriffy.  . Connective tissue disease (HCC)    Dr. Malena CatholicHawkes--rheum.  Started plaquenil 09/2014.  Marland Kitchen. Depression   . Fibromyalgia    per pt report  . History of cervical cancer   . History of frequent urinary tract infections   . Hypothyroidism    med noncompliance  . Positive ANA (antinuclear  antibody)   . Tobacco dependence     Past Surgical History:  Procedure Laterality Date  . Adhesions removed  2008  . ESOPHAGOGASTRODUODENOSCOPY  2005   mild gastritis; h pylori neg.  Says she has had esoph dilation several times  . holter monitor  2004   Normal  . MRI brain  2002;2004   Normal  . OOPHORECTOMY  2007   right-path neg  .  OOPHORECTOMY  2012   left  . TONSILLECTOMY  1978  . TRANSTHORACIC ECHOCARDIOGRAM  2004   Normal  . VAGINAL HYSTERECTOMY  1999   for cervical cancer per pt    Family History:  Family History  Problem Relation Age of Onset  . Arthritis Mother   . Cancer Mother        Lung  . Arthritis Father     Social History:  reports that she has been smoking cigarettes.  She has been smoking about 0.50 packs per day. she has never used smokeless tobacco. She reports that she does not drink alcohol or use drugs.  Additional Social History:  Alcohol / Drug Use Pain Medications: See MAR Prescriptions: See MAR Over the Counter: See MAR History of alcohol / drug use?: No history of alcohol / drug abuse Longest period of sobriety (when/how long): NA  CIWA: CIWA-Ar BP: (!) 140/97 Pulse Rate: 96 COWS:    PATIENT STRENGTHS: (choose at least two) Ability for insight Average or above average intelligence Capable of independent living MetallurgistCommunication skills Financial means General fund of knowledge Motivation for treatment/growth Physical Health Supportive family/friends  Allergies:  Allergies  Allergen Reactions  . Aspirin Other (See Comments)    Unknown rx; this allergy was simply noted in former PCP's records    Home Medications:  (Not in a hospital admission)  OB/GYN Status:  No LMP recorded. Patient has had a hysterectomy.  General Assessment Data Location of Assessment: [facility] TTS Assessment: In system Is this a Tele or Face-to-Face Assessment?: Tele Assessment Is this an Initial Assessment or a Re-assessment for this encounter?: Initial Assessment Marital status: Married SuperiorMaiden name: NA Is patient pregnant?: No Pregnancy Status: No Living Arrangements: Spouse/significant other, Children(Husband and son (23)) Can pt return to current living arrangement?: Yes Admission Status: Voluntary Is patient capable of signing voluntary admission?: Yes Referral Source:  Self/Family/Friend Insurance type: Self-pay     Crisis Care Plan Living Arrangements: Spouse/significant other, Children(Husband and son ([id number])) Legal Guardian: Other:(Self) Name of Psychiatrist: None Name of Therapist: None  Education Status Is patient currently in school?: No Current Grade: NA Highest grade of school patient has completed: 12 Name of school: NA Contact person: NA  Risk to self with the past 6 months Suicidal Ideation: Yes-Currently Present Has patient been a risk to self within the past 6 months prior to admission? : Yes Suicidal Intent: Yes-Currently Present Has patient had any suicidal intent within the past 6 months prior to admission? : Yes Is patient at risk for suicide?: Yes Suicidal Plan?: Yes-Currently Present Has patient had any suicidal plan within the past 6 months prior to admission? : Yes Specify Current Suicidal Plan: Pt put bag around her head in suicide attempt Access to Means: Yes Specify Access to Suicidal Means: Pt had bag around her head What has been your use of drugs/alcohol within the last 12 months?: Pt denies Previous Attempts/Gestures: Yes How many times?: 3 Other Self Harm Risks: None Triggers for Past Attempts: Other (Comment)(Death of sister and nephew) Intentional Self Injurious Behavior:  None Family Suicide History: No Recent stressful life event(s): Financial Problems, Loss (Comment)(Sister and nephew died last year) Persecutory voices/beliefs?: No Depression: Yes Depression Symptoms: Despondent, Tearfulness, Isolating, Fatigue, Guilt, Feeling worthless/self pity, Loss of interest in usual pleasures Substance abuse history and/or treatment for substance abuse?: No Suicide prevention information given to non-admitted patients: Not applicable  Risk to Others within the past 6 months Homicidal Ideation: No Does patient have any lifetime risk of violence toward others beyond the six months prior to admission? : No Thoughts of  Harm to Others: No Current Homicidal Intent: No Current Homicidal Plan: No Access to Homicidal Means: No Identified Victim: None History of harm to others?: No Assessment of Violence: None Noted Violent Behavior Description: Pt denies history of violence Does patient have access to weapons?: No Criminal Charges Pending?: No Does patient have a court date: No Is patient on probation?: No  Psychosis Hallucinations: None noted Delusions: None noted  Mental Status Report Appearance/Hygiene: In scrubs Eye Contact: Good Motor Activity: Unremarkable Speech: Logical/coherent Level of Consciousness: Alert Mood: Depressed Affect: Depressed Anxiety Level: Moderate Thought Processes: Coherent, Relevant Judgement: Partial Orientation: Person, Place, Time, Situation, Appropriate for developmental age Obsessive Compulsive Thoughts/Behaviors: None  Cognitive Functioning Concentration: Normal Memory: Recent Intact, Remote Intact IQ: Average Insight: Fair Impulse Control: Fair Appetite: Fair Weight Loss: 0 Weight Gain: 0 Sleep: Decreased Total Hours of Sleep: 4 Vegetative Symptoms: None  ADLScreening [facility] Assessment Services) Patient's cognitive ability adequate to safely complete daily activities?: Yes Patient able to express need for assistance with ADLs?: Yes Independently performs ADLs?: Yes (appropriate for developmental age)  Prior Inpatient Therapy Prior Inpatient Therapy: Yes Prior Therapy Dates: 04/2016 Prior Therapy Facilty/Provider(s): [facility] Reason for Treatment: Depression  Prior Outpatient Therapy Prior Outpatient Therapy: Yes Prior Therapy Dates: 2017 Prior Therapy Facilty/Provider(s): Unknown Reason for Treatment: MDD Does patient have an ACCT team?: No Does patient have Intensive In-House Services?  : No Does patient have [facility] services? : No Does patient have P4CC services?: No  ADL Screening (condition at time of admission) Patient's cognitive  ability adequate to safely complete daily activities?: Yes Is the patient deaf or have difficulty hearing?: No Does the patient have difficulty seeing, even when wearing glasses/contacts?: No Does the patient have difficulty concentrating, remembering, or making decisions?: No Patient able to express need for assistance with ADLs?: Yes Does the patient have difficulty dressing or bathing?: No Independently performs ADLs?: Yes (appropriate for developmental age) Does the patient have difficulty walking or climbing stairs?: No Weakness of Legs: None Weakness of Arms/Hands: None       Abuse/Neglect Assessment (Assessment to be complete while patient is alone) Abuse/Neglect Assessment Can Be Completed: Yes Physical Abuse: Denies Verbal Abuse: Denies Sexual Abuse: Denies Exploitation of patient/patient's resources: Denies Self-Neglect: Denies     Merchant navy officer (For Healthcare) Does Patient Have a Medical Advance Directive?: No Would patient like information on creating a medical advance directive?: No - Patient declined    Additional Information 1:1 In Past 12 Months?: No CIRT Risk: No Elopement Risk: No Does patient have medical clearance?: Yes     Disposition: Gave clinical report to Nira Conn, NP who said Pt meets criteria for inpatient psychiatric treatment. Clint Bolder, [facility] at [facility], confirmed adult unit is at capacity. TTS will contact facilities for placement. Notified Dr. Nilda Riggs and Waynetta Sandy, RN of recommendation.  Disposition Initial Assessment Completed for this Encounter: Yes Disposition of Patient: Inpatient treatment program  This service was provided via telemedicine  using a 2-way, interactive audio and Immunologist.  Names of all persons participating in this telemedicine service and their role in this encounter. Name: Valere Dross Role: Patient  Name: Shela Commons, [place] Role: TTS counselor         Harlin Rain Patsy [place], [facility], [facility], [facility] Triage  Specialist [phone]  Pamalee Leyden 03/12/2017 1:55 AM

## 2017-03-12 NOTE — ED Notes (Signed)
Graham crackers and water given  ?

## 2017-03-12 NOTE — Plan of Care (Signed)
New Patient Admission   Progressing Spiritual Needs Ability to function at adequate level 03/12/2017 2003 - Progressing by Addison Naegelieynolds, Nalla Purdy I, RN Activity: Interest or engagement in activities will improve 03/12/2017 2003 - Progressing by Addison Naegelieynolds, Aicha Clingenpeel I, RN Sleeping patterns will improve 03/12/2017 2003 - Progressing by Berkley Harveyeynolds, Daylen Lipsky I, RN Education: Knowledge of [place] [facility] Education information/materials will improve 03/12/2017 2003 - Progressing by Addison Naegelieynolds, Tevyn Codd I, RN Emotional status will improve 03/12/2017 2003 - Progressing by Addison Naegelieynolds, Rashawna Scoles I, RN Mental status will improve 03/12/2017 2003 - Progressing by Addison Naegelieynolds, Francina Beery I, RN Verbalization of understanding the information provided will improve 03/12/2017 2003 - Progressing by Addison Naegelieynolds, Johany Hansman I, RN Coping: Ability to verbalize frustrations and anger appropriately will improve 03/12/2017 2003 - Progressing by Addison Naegelieynolds, Ahmed Inniss I, RN Ability to demonstrate self-control will improve 03/12/2017 2003 - Progressing by Berkley Harveyeynolds, Demetries Coia I, RN Health Behavior/Discharge Planning: Identification of resources available to assist in meeting health care needs will improve 03/12/2017 2003 - Progressing by Addison Naegelieynolds, Hyman Crossan I, RN Compliance with treatment plan for underlying cause of condition will improve 03/12/2017 2003 - Progressing by Addison Naegelieynolds, Dashaun Onstott I, RN Physical Regulation: Ability to maintain clinical measurements within normal limits will improve 03/12/2017 2003 - Progressing by Addison Naegelieynolds, Sandrika Schwinn I, RN Safety: Periods of time without injury will increase 03/12/2017 2003 - Progressing by Berkley Harveyeynolds, Moneisha Vosler I, RN Education: Ability to make informed decisions regarding treatment will improve 03/12/2017 2003 - Progressing by Addison Naegelieynolds, Thom Ollinger I, RN Coping: Ability to cope will improve 03/12/2017 2003 - Progressing by Addison Naegelieynolds, Darrie Macmillan I, RN Self-Concept: Ability to disclose and discuss suicidal ideas will improve 03/12/2017  2003 - Progressing by Addison Naegelieynolds, Enma Maeda I, RN Ability to verbalize positive feelings about self will improve 03/12/2017 2003 - Progressing by Addison Naegelieynolds, Duncan Alejandro I, RN Activity: Interest or engagement in leisure activities will improve 03/12/2017 2003 - Progressing by Addison Naegelieynolds, Amro Winebarger I, RN Imbalance in normal sleep/wake cycle will improve 03/12/2017 2003 - Progressing by Addison Naegelieynolds, Khi Mcmillen I, RN Coping: Ability to cope will improve 03/12/2017 2003 - Progressing by Berkley Harveyeynolds, Tenishia Ekman I, RN Ability to verbalize feelings will improve 03/12/2017 2003 - Progressing by Addison Naegelieynolds, Niamya Vittitow I, RN Safety: Ability to disclose and discuss suicidal ideas will improve 03/12/2017 2003 - Progressing by Addison Naegelieynolds, Khamiyah Grefe I, RN Ability to identify and utilize support systems that promote safety will improve 03/12/2017 2003 - Progressing by Berkley Harveyeynolds, [place] Want I, RN

## 2017-03-12 NOTE — ED Provider Notes (Signed)
Patient still with suicidal thoughts.  Patient required a little additional workup for medical clearance for admission to behavioral health.  Chest x-ray negative for pneumonia.  Differential on her white blood cell count does not show anything overly concerning for leukemia.  But will require follow-up CBCs.  Urinalysis was negative.  No evidence for urinary tract infection.  Patient [facility].   Vanetta MuldersZackowski, Vidal Lampkins, MD 03/12/17 [phone]

## 2017-03-12 NOTE — Progress Notes (Signed)
Accepted to [facility]- Behavioral Medicine Unit.   Accepting/attending physician is Dr. Jennet MaduroPucilowska.  Nurse to Nurse report is [phone] Room 324  Please have the patient sign a voluntary consent form and have it facxed to [facility] at [phone].   Claris CheMargaret, Nurse Secretary notified and agreed to inform the patient's assigned nurse and ED Provider.    Baldo DaubJolan Loranzo Desha MSW, LCSWA CSW Disposition [phone]

## 2017-03-12 NOTE — ED Notes (Addendum)
Per EDP. Pt unable to receive her percocet while being treated for psyche. Tylenol offered again. Pt agreed to taking tylenol

## 2017-03-12 NOTE — ED Notes (Signed)
edp aware of transfer/acceptance. Advised of wbc count and suggested UA.

## 2017-03-13 MED ORDER — ACETAMINOPHEN 325 MG PO TABS
650.0000 mg | ORAL_TABLET | Freq: Four times a day (QID) | ORAL | Status: DC | PRN
  Administered 2017-03-13 – 2017-03-20 (×2): 650 mg via ORAL
  Filled 2017-03-13 (×2): qty 2

## 2017-03-13 MED ORDER — VENLAFAXINE HCL ER 75 MG PO CP24
150.0000 mg | ORAL_CAPSULE | Freq: Every day | ORAL | Status: DC
  Administered 2017-03-14: 150 mg via ORAL
  Filled 2017-03-13: qty 2

## 2017-03-13 MED ORDER — QUETIAPINE FUMARATE 100 MG PO TABS
100.0000 mg | ORAL_TABLET | Freq: Every day | ORAL | Status: DC
  Administered 2017-03-13: 100 mg via ORAL
  Filled 2017-03-13: qty 1

## 2017-03-13 MED ORDER — OXYCODONE-ACETAMINOPHEN 5-325 MG PO TABS
2.0000 | ORAL_TABLET | ORAL | Status: DC | PRN
  Administered 2017-03-13 – 2017-03-20 (×35): 2 via ORAL
  Filled 2017-03-13 (×36): qty 2

## 2017-03-13 MED ORDER — ALUM & MAG HYDROXIDE-SIMETH 200-200-20 MG/5ML PO SUSP: 30.0000 mL | ORAL | Status: DC | PRN

## 2017-03-13 MED ORDER — TRAZODONE HCL 100 MG PO TABS: 100.0000 mg | ORAL_TABLET | Freq: Every day | ORAL | Status: DC

## 2017-03-13 MED ORDER — OXYCODONE-ACETAMINOPHEN 5-325 MG PO TABS
2.0000 | ORAL_TABLET | Freq: Four times a day (QID) | ORAL | Status: DC | PRN
  Filled 2017-03-13: qty 2

## 2017-03-13 MED ORDER — OXCARBAZEPINE 300 MG PO TABS
300.0000 mg | ORAL_TABLET | Freq: Two times a day (BID) | ORAL | Status: DC
  Administered 2017-03-13 – 2017-03-18 (×9): 300 mg via ORAL
  Filled 2017-03-13 (×10): qty 1

## 2017-03-13 MED ORDER — MAGNESIUM HYDROXIDE 400 MG/5ML PO SUSP: 30.0000 mL | Freq: Every day | ORAL | Status: DC | PRN
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
## 2017-03-13 NOTE — BHH Suicide Risk Assessment (Signed)
[facility] INPATIENT:  Family/Significant Other Suicide Prevention Education  Suicide Prevention Education:  Contact Attempts: Linna DarnerJeff Verry, pt's husband, at [phone] has been identified by the patient as the family member/significant other with whom the patient will be residing, and identified as the person(s) who will aid the patient in the event of a mental health crisis.  With written consent from the patient, two attempts were made to provide suicide prevention education, prior to and/or following the patient's discharge.  We were unsuccessful in providing suicide prevention education.  A suicide education pamphlet was given to the patient to share with family/significant other.   Pt's husband stated, "I didn't know what was going on. She's always had depression. She's always battled with that. I don't have any concerns. I really don't understand. I know her depression has been hard on her. I just want her to get whatever is going to make her healthy." Pt's husband stated that he was unaware pt attempted suicide prior to admission. Pt's husband confirmed that pt is able to return home and will not have access to weapons or other means of self harm upon discharge.    Date and time of first attempt: 03/13/17 at 10:50AM Date and time of second attempt:*03/13/17 at 1409PM     Heidi DachKelsey Laurajean Hosek, LCSW 03/13/2017, 10:56 AM

## 2017-03-13 NOTE — Progress Notes (Signed)
Recreation Therapy Notes  INPATIENT RECREATION THERAPY ASSESSMENT  Patient Details Name: Victoria Galvan MRN: [id number] DOB: 09/10/1970 Today's Date: 03/13/2017  Patient Stressors: Death, Other (Comment)(Overthinking things, Holidays)  Coping Skills:   Other (Comment)(None)  Personal Challenges: Concentration, Expressing Yourself, Problem-Solving, Self-Esteem/Confidence, Social Interaction, Stress Management, Time Management, Trusting Others  Leisure Interests (2+):  Social - Family, Social - Friends  LawyerAwareness of Community Resources:  Yes  Community Resources:     Current Use: No  If no, Barriers?: Attitudinal  Patient Strengths:  Being there for others  Patient Identified Areas of Improvement:  Everything  Current Recreation Participation:  None  Patient Goal for Hospitalization:  Break a cycle  [place]  [place]   Current [place] (including self-harm):  No  Current HI:  No  Consent to Intern Participation: N/A   [name] 03/13/2017, 12:32 PM

## 2017-03-13 NOTE — Plan of Care (Signed)
Patient unable to rate depression.  Endorses passive SI.  Visible in the milieu, interacting with peers appropriately.  Attending groups, medication compliant.  Maintaining personal care chores.  Appetite fair.  Patient remains safe on the unit.
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
## 2017-03-13 NOTE — [facility] Group Notes (Signed)
[facility] Group Notes:  (Nursing/MHT/Case Management/Adjunct)  Date:  03/13/2017  Time:  12:18 AM  Type of Therapy:  Evening Wrap-up Group  Participation Level:  Did Not Attend  Participation Quality:  N/A  Affect:  N/A  Cognitive:  N/A  Insight:  None  Engagement in Group:  Did Not Attend  Modes of Intervention:  Discussion  Summary of Progress/Problems:  Victoria Galvan 03/13/2017, 12:18 AM 

## 2017-03-13 NOTE — Progress Notes (Signed)
Recreation Therapy Notes   Date: 11.20.18  Time: 1:00 pm  Location: Craft Room  Behavioral response: Appropriate  Intervention Topic: Time Management  Discussion/Intervention: Group content today was focused on time management. The group defined time management and identified healthy ways to manage time. Individuals expressed how much of the 24 hours they use in a day. Patients expressed how much time they use just for themselves personally. The group expressed how they have managed their time in the past. Individuals participated in the intervention "Managing Life" where they had a chance to see how much of the 24 hours they use and where it goes. Clinical Observations/Feedback:  Patient came to group and expressed that she normally manages her time poorly. She identified she spends her time doing a lot for others. Individual participated in the intervention and was social with peers and staff. Victoria Galvan LRT/CTRS         Victoria Galvan 03/13/2017 2:25 PM

## 2017-03-13 NOTE — [facility] Suicide Risk Assessment (Signed)
[facility]   Nursing information obtained from:    Demographic factors:    Current Mental Status:    Loss Factors:    Historical Factors:    Risk Reduction Factors:     Total Time spent with patient: 1 hour Principal Problem: Major depressive disorder, recurrent, severe with psychotic features ([facility]) Diagnosis:   Patient Active Problem List   Diagnosis Date Noted  . Major depressive disorder, recurrent, severe with psychotic features ([facility]) [F33.3] 03/12/2017  . Suicidal ideations [R45.851]   . MDD (major depressive disorder), recurrent episode, severe ([facility]) [F33.2] 05/03/2016  . GAD (generalized anxiety disorder) [F41.1] 05/13/2015  . Myalgia [M79.10] 08/07/2014  . Positive ANA (antinuclear antibody) [R76.8] 08/07/2014  . Hypothyroidism [E03.9] 08/07/2014   Subjective Data: suicidal ideation  Continued Clinical Symptoms:  Alcohol Use Disorder Identification Test Final Score (AUDIT): 1 The "Alcohol Use Disorders Identification Test", Guidelines for Use in Primary Care, Second Edition.  World Science writerHealth Organization [facility](WHO). Score between 0-7:  no or low risk or alcohol related problems. Score between 8-15:  moderate risk of alcohol related problems. Score between 16-19:  high risk of alcohol related problems. Score 20 or above:  warrants further diagnostic evaluation for alcohol dependence and treatment.   CLINICAL FACTORS:   Bipolar Disorder:   Mixed State Depression:   Severe Chronic Pain   Musculoskeletal: Strength & Muscle Tone: within normal limits Gait & Station: normal Patient leans: N/A  Psychiatric Specialty Exam: Physical Exam  Nursing note and vitals reviewed. Psychiatric: Judgment normal. Her affect is blunt. Her speech is delayed. She is slowed and withdrawn. Cognition and memory are normal. She exhibits a depressed mood. She expresses suicidal ideation. She expresses suicidal plans.    Review of Systems  Musculoskeletal: Positive for  back pain, myalgias and neck pain.  Neurological: Negative.   Psychiatric/Behavioral: Positive for depression and suicidal ideas. The patient has insomnia.   All other systems reviewed and are negative.   Blood pressure 111/79, pulse 83, temperature 98.5 F (36.9 C), temperature source Oral, resp. rate 18, height 5\' 1"  (1.549 m), weight 61.7 kg (136 lb), SpO2 100 %.Body mass index is 25.7 kg/m.  General Appearance: Casual  Eye Contact:  Good  Speech:  Clear and Coherent  Volume:  Decreased  Mood:  Depressed and Hopeless  Affect:  Blunt  Thought Process:  Goal Directed and Descriptions of Associations: Intact  Orientation:  Full (Time, Place, and Person)  Thought Content:  WDL  Suicidal Thoughts:  Yes.  with intent/plan  Homicidal Thoughts:  No  Memory:  Immediate;   Fair Recent;   Fair Remote;   Fair  Judgement:  Fair  Insight:  Present  Psychomotor Activity:  Decreased  Concentration:  Concentration: Fair and Attention Span: Fair  Recall:  [employer]ervFair  Fund of Knowledge:  Fair  Language:  Fair  Akathisia:  No  Handed:  Right  AIMS (if indicated):     Assets:  Communication Skills Desire for Improvement Housing Resilience Social Support  ADL's:  Intact  Cognition:  WNL  Sleep:         COGNITIVE FEATURES THAT CONTRIBUTE TO RISK:  None    SUICIDE RISK:   Severe:  Frequent, intense, and enduring suicidal ideation, specific plan, no subjective intent, but some objective markers of intent (i.e., choice of lethal method), the method is accessible, some limited preparatory behavior, evidence of impaired self-control, severe dysphoria/symptomatology, multiple risk factors present, and few if any protective factors, particularly a  lack of social support.  PLAN OF CARE: hospital admission, medication management, discharge planning.  Ms. Eliberto Ivoryustin is a 46 year old female with a history of recurrent depression admitted for suicidal ideation in the context of treatment  noncompliance.  #Suicidal ideation -patient is able to contract for safety in the hospital  #Mood -start Effexor 150 mg daily as she is unable to afford Cymbalta -start Seroquel 100 mg nightly -start Trileptal 300 mg BID  #Hypothyroidism -start Synthroid 100 mcg daily  #Chronic pain -continue Percocet 10 mg every 4-6 hours PRN -continue neurontin 300 mg TID  #Disposition -discharge with family -follow up with a local provider  I certify that inpatient services furnished can reasonably be expected to improve the patient's condition.   Kristine LineaJolanta Greg Cratty, MD 03/13/2017, 12:13 PM

## 2017-03-14 MED ORDER — PROCHLORPERAZINE MALEATE 10 MG PO TABS
10.0000 mg | ORAL_TABLET | Freq: Four times a day (QID) | ORAL | Status: DC | PRN
  Administered 2017-03-14: 10 mg via ORAL
  Filled 2017-03-14 (×2): qty 1

## 2017-03-14 MED ORDER — VENLAFAXINE HCL ER 75 MG PO CP24
75.0000 mg | ORAL_CAPSULE | Freq: Every day | ORAL | Status: DC
  Administered 2017-03-15: 75 mg via ORAL
  Filled 2017-03-14: qty 1

## 2017-03-14 MED ORDER — QUETIAPINE FUMARATE 200 MG PO TABS
200.0000 mg | ORAL_TABLET | Freq: Every day | ORAL | Status: DC
  Administered 2017-03-14 – 2017-03-16 (×3): 200 mg via ORAL
  Filled 2017-03-14 (×3): qty 1

## 2017-03-14 NOTE — Plan of Care (Signed)
  Education: Mental status will improve 03/14/2017 0507 - Progressing by Trula OreAjetunmobi, Jordan Caraveo Abisola, RN   Education: Verbalization of understanding the information provided will improve 03/14/2017 0507 - Progressing by Trula OreAjetunmobi, Rhea Thrun Abisola, RN

## 2017-03-14 NOTE — Tx Team (Signed)
Interdisciplinary Treatment and Diagnostic Plan Update  03/14/2017 Time of Session: 1030 Elveria Royalsva I Macintyre MRN: [id number]  Principal Diagnosis: Major depressive disorder, recurrent, severe with psychotic features (HCC)  Secondary Diagnoses: Principal Problem:   Major depressive disorder, recurrent, severe with psychotic features (HCC) Active Problems:   Hypothyroidism   Suicidal ideations   Current Medications:  Current Facility-Administered Medications  Medication Dose Route Frequency Provider Last Rate Last Dose  . acetaminophen (TYLENOL) tablet 650 mg  650 mg Oral Q6H PRN Pucilowska, Jolanta B, MD   650 mg at 03/13/17 0949  . alum & mag hydroxide-simeth (MAALOX/MYLANTA) 200-200-20 MG/5ML suspension 30 mL  30 mL Oral Q4H PRN Pucilowska, Jolanta B, MD      . gabapentin (NEURONTIN) capsule 300 mg  300 mg Oral TID Pucilowska, Jolanta B, MD   300 mg at 03/14/17 0816  . levothyroxine (SYNTHROID, LEVOTHROID) tablet 100 mcg  100 mcg Oral QAC breakfast Pucilowska, Jolanta B, MD   100 mcg at 03/14/17 0816  . magnesium hydroxide (MILK OF MAGNESIA) suspension 30 mL  30 mL Oral Daily PRN Pucilowska, Jolanta B, MD      . Oxcarbazepine (TRILEPTAL) tablet 300 mg  300 mg Oral BID Pucilowska, Jolanta B, MD   300 mg at 03/14/17 0816  . oxyCODONE-acetaminophen (PERCOCET/ROXICET) 5-325 MG per tablet 2 tablet  2 tablet Oral Q4H PRN Pucilowska, Jolanta B, MD   2 tablet at 03/14/17 1216  . QUEtiapine (SEROQUEL) tablet 100 mg  100 mg Oral QHS Pucilowska, Jolanta B, MD   100 mg at 03/13/17 2137  . venlafaxine XR (EFFEXOR-XR) 24 hr capsule 150 mg  150 mg Oral Q breakfast Pucilowska, Jolanta B, MD   150 mg at 03/14/17 [id number]   Facility-Administered Medications Ordered in Other Encounters  Medication Dose Route Frequency Provider Last Rate Last Dose  . gabapentin (NEURONTIN) tablet 300 mg  300 mg Oral TID Pucilowska, Jolanta B, MD      . hydrOXYzine (ATARAX/VISTARIL) tablet 50 mg  50 mg Oral TID PRN Pucilowska,  Jolanta B, MD      . nicotine (NICODERM CQ - dosed in mg/24 hours) patch 21 mg  21 mg Transdermal Q0600 Pucilowska, Jolanta B, MD       PTA Medications: Medications Prior to Admission  Medication Sig Dispense Refill Last Dose  . gabapentin (NEURONTIN) 300 MG capsule Take 1 capsule (300 mg total) by mouth 4 (four) times daily - after meals and at bedtime. (Patient taking differently: Take 600 mg at bedtime by mouth. ) 120 capsule 0 03/11/2017 at Unknown time  . hydrOXYzine (ATARAX/VISTARIL) 50 MG tablet Take 1 tablet (50 mg total) by mouth every 6 (six) hours as needed for anxiety. 30 tablet 0 Past Week at Unknown time  . levothyroxine (SYNTHROID, LEVOTHROID) 100 MCG tablet Take 50 mcg daily before breakfast by mouth.   03/12/2017 at Unknown time  . oxyCODONE-acetaminophen (PERCOCET) 10-325 MG tablet Take 1 tablet every 4 (four) hours as needed by mouth for pain (RA).   03/11/2017 at Unknown time    Patient Stressors: Financial difficulties Health problems Loss of Sister and Nephew Traumatic event  Patient Strengths: Active sense of humor Average or above average intelligence Capable of independent living Communication skills Motivation for treatment/growth Supportive family/friends  Treatment Modalities: Medication Management, Group therapy, Case management,  1 to 1 session with clinician, Psychoeducation, Recreational therapy.   Physician Treatment Plan for Primary Diagnosis: Major depressive disorder, recurrent, severe with psychotic features (HCC) Long Term Goal(s): Improvement in symptoms so  as ready for discharge NA   Short Term Goals: Ability to identify changes in lifestyle to reduce recurrence of condition will improve Ability to verbalize feelings will improve Ability to disclose and discuss suicidal ideas Ability to demonstrate self-control will improve Ability to identify and develop effective coping behaviors will improve Ability to maintain clinical measurements  within normal limits will improve Compliance with prescribed medications will improve Ability to identify triggers associated with substance abuse/mental health issues will improve NA  Medication Management: Evaluate patient's response, side effects, and tolerance of medication regimen.  Therapeutic Interventions: 1 to 1 sessions, Unit Group sessions and Medication administration.  Evaluation of Outcomes: Progressing  Physician Treatment Plan for Secondary Diagnosis: Principal Problem:   Major depressive disorder, recurrent, severe with psychotic features (HCC) Active Problems:   Hypothyroidism   Suicidal ideations  Long Term Goal(s): Improvement in symptoms so as ready for discharge NA   Short Term Goals: Ability to identify changes in lifestyle to reduce recurrence of condition will improve Ability to verbalize feelings will improve Ability to disclose and discuss suicidal ideas Ability to demonstrate self-control will improve Ability to identify and develop effective coping behaviors will improve Ability to maintain clinical measurements within normal limits will improve Compliance with prescribed medications will improve Ability to identify triggers associated with substance abuse/mental health issues will improve NA     Medication Management: Evaluate patient's response, side effects, and tolerance of medication regimen.  Therapeutic Interventions: 1 to 1 sessions, Unit Group sessions and Medication administration.  Evaluation of Outcomes: Progressing   RN Treatment Plan for Primary Diagnosis: Major depressive disorder, recurrent, severe with psychotic features (HCC) Long Term Goal(s): Knowledge of disease and therapeutic regimen to maintain health will improve  Short Term Goals: Ability to participate in decision making will improve, Ability to identify and develop effective coping behaviors will improve and Compliance with prescribed medications will  improve  Medication Management: RN will administer medications as ordered by provider, will assess and evaluate patient's response and provide education to patient for prescribed medication. RN will report any adverse and/or side effects to prescribing provider.  Therapeutic Interventions: 1 on 1 counseling sessions, Psychoeducation, Medication administration, Evaluate responses to treatment, Monitor vital signs and CBGs as ordered, Perform/monitor CIWA, COWS, AIMS and Fall Risk screenings as ordered, Perform wound care treatments as ordered.  Evaluation of Outcomes: Progressing   LCSW Treatment Plan for Primary Diagnosis: Major depressive disorder, recurrent, severe with psychotic features (HCC) Long Term Goal(s): Safe transition to appropriate next level of care at discharge, Engage patient in therapeutic group addressing interpersonal concerns.  Short Term Goals: Engage patient in aftercare planning with referrals and resources, Increase emotional regulation and Identify triggers associated with mental health/substance abuse issues  Therapeutic Interventions: Assess for all discharge needs, 1 to 1 time with Social worker, Explore available resources and support systems, Assess for adequacy in community support network, Educate family and significant other(s) on suicide prevention, Complete Psychosocial Assessment, Interpersonal group therapy.  Evaluation of Outcomes: Progressing   Progress in Treatment: Attending groups: Yes. Participating in groups: Yes. Taking medication as prescribed: Yes. Toleration medication: Yes. Family/Significant other contact made: Yes, individual(s) contacted:  pt's husband Patient understands diagnosis: Yes. Discussing patient identified problems/goals with staff: Yes. Medical problems stabilized or resolved: Yes. Denies suicidal/homicidal ideation: Yes. Issues/concerns per patient self-inventory: No. Other: N/A  New problem(s) identified: No, Describe:   N/A  New Short Term/Long Term Goal(s): Pt reported her goal for treatment is to, "learn how to control  my thoughts." Pt will also work towards emotional regulation improvement, crisis stabilization, and decreasing depression.   Discharge Plan or Barriers: Pt will be discharged home and will follow up with [facility] for outpatient tx upon discharge.   Reason for Continuation of Hospitalization: Depression Medication stabilization  Estimated Length of Stay: 5 days  Attendees: Patient: 03/14/2017   Physician: Dr. Jennet Maduro, MD 03/14/2017  Nursing: Hulan Amato, RN 03/14/2017   RN Care Manager:  03/14/2017  Social Worker:  Heidi Dach, LCSW 03/14/2017  Recreational Therapist: Garret Reddish, LRT/CTRS 03/14/2017   Other: Daleen Squibb, LCSW 03/14/2017   Other:  03/14/2017   Other: 03/14/2017      Scribe for Treatment Team: Heidi Dach, LCSW 03/14/2017 12:53 PM

## 2017-03-14 NOTE — Plan of Care (Signed)
Patient continues to complain of generalized pain at least every 4 hours. Patient complains of nausea, prn given with partial relief. Patient denies SI, HI, AVH. No negative behaviors. Pleasant and cooperative with staff and peers.  Encouragement and support offered. Safety checks maintained. Medications given as prescribed. Pt receptive and remains safe on unit with q 15 min checks.

## 2017-03-14 NOTE — Progress Notes (Signed)
Recreation Therapy Notes  Date: 11.21.18  Time: 1:00pm  Location: Craft Room  Behavioral response: Appropriate  Intervention Topic: Leisure  Discussion/Intervention: Patient did not attend group.  Clinical Observations/Feedback:  Patient did not attend group.  Estephan Gallardo LRT/CTRS         Keshaun Dubey 03/14/2017 2:18 PM 

## 2017-03-14 NOTE — [facility] Group Notes (Signed)
  [facility] LCSW Group Therapy Note  Date/Time: 03/14/17, 0930  Type of Therapy/Topic:  Group Therapy:  Emotion Regulation  Participation Level:  Active   Mood:pleasant  Description of Group:    The purpose of this group is to assist patients in learning to regulate negative emotions and experience positive emotions. Patients will be guided to discuss ways in which they have been vulnerable to their negative emotions. These vulnerabilities will be juxtaposed with experiences of positive emotions or situations, and patients challenged to use positive emotions to combat negative ones. Special emphasis will be placed on coping with negative emotions in conflict situations, and patients will process healthy conflict resolution skills.  Therapeutic Goals: 1. Patient will identify two positive emotions or experiences to reflect on in order to balance out negative emotions:  2. Patient will label two or more emotions that they find the most difficult to experience:  3. Patient will be able to demonstrate positive conflict resolution skills through discussion or role plays:   Summary of Patient Progress: Pt identified anger as primary emotion that is difficult to experience.  Pt shared situations with her extended family that lead to her losing control of her anger.  Pt was active participant in discussion about more positive ways to deal with negative emotions.       Therapeutic Modalities:   Cognitive Behavioral Therapy Feelings Identification Dialectical Behavioral Therapy  Daleen SquibbGreg Icelynn Onken, LCSW

## 2017-03-14 NOTE — Progress Notes (Signed)
D: Patient is alert and oriented x 4, denies SI/HI/AVH, affect is flat, mood is pleasant and cooperative, thoughts are organized no distress noted. Pt appears less anxious and she is interacting with peers and staff appropriately.  A: Pt was offered support and encouragement, given scheduled medications and was  encouraged to attend evening wrap up group. 15 minute checks were done for safety.  R:Pt attends groups and interacts appropriately with  peers and staff. Pt is complaint with  Medication. 15 minutes safety rounds maintained on unit.

## 2017-03-14 NOTE — Progress Notes (Signed)
Recreation Therapy Notes  Date: 11.21.2018  Time: 3:00pm  Location: Craft room  Behavioral response: N/A  Group Type: Craft  Participation level: N/A  Communication: N/A  Comments: Patient did not attend group.  Bralyn Folkert LRT/CTRS        Hyun Marsalis 03/14/2017 4:32 PM

## 2017-03-14 NOTE — Progress Notes (Signed)
[facility] MD Progress Note  03/14/2017 2:39 PM Victoria Galvan  MRN:  [id number]  Subjective:   Victoria Galvan is a 46 year old female with a history of depression admitted for suicidal ideation.  Victoria Galvan met with treatment team this morning. She is still depressed, anxious and has passing thoughts of suicide. Complains of nausea possibly from effexor. Slept better with Seroquel.  Treatment plan. We continue Seroquel 200 mg nightly and Trileptal 300 mg BID for mood stabilization and Effexor for depression/anxiety.  Social/disposition. She will return home, follow up with [facility].  Principal Problem: Major depressive disorder, recurrent, severe with psychotic features ([place]) Diagnosis:   Patient Active Problem List   Diagnosis Date Noted  . Major depressive disorder, recurrent, severe with psychotic features ([place]) [F33.3] 03/12/2017  . Suicidal ideations [R45.851]   . MDD (major depressive disorder), recurrent episode, severe ([place]) [F33.2] 05/03/2016  . GAD (generalized anxiety disorder) [F41.1] 05/13/2015  . Myalgia [M79.10] 08/07/2014  . Positive ANA (antinuclear antibody) [R76.8] 08/07/2014  . Hypothyroidism [E03.9] 08/07/2014   Total Time spent with patient: 20 minutes  Past Psychiatric History: depression and anxiety.  Past Medical History:  Past Medical History:  Diagnosis Date  . Anxiety disorder    with panic  . Cervical spinal stenosis    spondylosis  . Chronic nausea    likely from chronic narcotic pain med use  . Chronic neck pain   . Colon polyps    Pt states "colon polyp was removed when they did my hysterectomy"--need old records to veriffy.  . Connective tissue disease ([place])    Dr. Philis Pique.  Started plaquenil 09/2014.  Marland Kitchen Depression   . Fibromyalgia    per pt report  . History of cervical cancer   . History of frequent urinary tract infections   . Hypothyroidism    med noncompliance  . Positive ANA (antinuclear antibody)   . Tobacco dependence     Past  Surgical History:  Procedure Laterality Date  . Adhesions removed  2008  . ESOPHAGOGASTRODUODENOSCOPY  2005   mild gastritis; h pylori neg.  Says she has had esoph dilation several times  . holter monitor  2004   Normal  . MRI brain  2002;2004   Normal  . OOPHORECTOMY  2007   right-path neg  . OOPHORECTOMY  2012   left  . TONSILLECTOMY  1978  . TRANSTHORACIC ECHOCARDIOGRAM  2004   Normal  . VAGINAL HYSTERECTOMY  1999   for cervical cancer per pt   Family History:  Family History  Problem Relation Age of Onset  . Arthritis Mother   . Cancer Mother        Lung  . Arthritis Father    Family Psychiatric  History: depression and anxiety. Social History:  Social History   Substance and Sexual Activity  Alcohol Use No  . Alcohol/week: 0.0 oz  . Frequency: Never   Comment: occasionally     Social History   Substance and Sexual Activity  Drug Use No    Social History   Socioeconomic History  . Marital status: Married    Spouse name: None  . Number of children: 2  . Years of education: College  . Highest education level: None  Social Needs  . Financial resource strain: None  . Food insecurity - worry: None  . Food insecurity - inability: None  . Transportation needs - medical: None  . Transportation needs - non-medical: None  Occupational History  . Occupation: CNA  Employer: UNEMPLOYED  Tobacco Use  . Smoking status: Current Every Day Smoker    Packs/day: 0.50    Types: Cigarettes  . Smokeless tobacco: Never Used  Substance and Sexual Activity  . Alcohol use: No    Alcohol/week: 0.0 oz    Frequency: Never    Comment: occasionally  . Drug use: No  . Sexual activity: Yes    Birth control/protection: Surgical  Other Topics Concern  . None  Social History Narrative   Married.   Education: GED, Psychologist, occupational.   Occupation: unemployed, formerly worked as a Quarry manager.   Additional Social History:                         Sleep: Fair  Appetite:   Fair  Current Medications: Current Facility-Administered Medications  Medication Dose Route Frequency Provider Last Rate Last Dose  . acetaminophen (TYLENOL) tablet 650 mg  650 mg Oral Q6H PRN Leonilda Cozby B, MD   650 mg at 03/13/17 0949  . alum & mag hydroxide-simeth (MAALOX/MYLANTA) 200-200-20 MG/5ML suspension 30 mL  30 mL Oral Q4H PRN Mearl Harewood B, MD      . gabapentin (NEURONTIN) capsule 300 mg  300 mg Oral TID Lama Narayanan B, MD   300 mg at 03/14/17 0816  . levothyroxine (SYNTHROID, LEVOTHROID) tablet 100 mcg  100 mcg Oral QAC breakfast Rikayla Demmon B, MD   100 mcg at 03/14/17 0816  . magnesium hydroxide (MILK OF MAGNESIA) suspension 30 mL  30 mL Oral Daily PRN Shamonique Battiste B, MD      . Oxcarbazepine (TRILEPTAL) tablet 300 mg  300 mg Oral BID Jacelyn Cuen B, MD   300 mg at 03/14/17 0816  . oxyCODONE-acetaminophen (PERCOCET/ROXICET) 5-325 MG per tablet 2 tablet  2 tablet Oral Q4H PRN Stafford Riviera B, MD   2 tablet at 03/14/17 1216  . prochlorperazine (COMPAZINE) tablet 10 mg  10 mg Oral Q6H PRN Jannifer Fischler B, MD      . QUEtiapine (SEROQUEL) tablet 100 mg  100 mg Oral QHS Jeanann Balinski B, MD   100 mg at 03/13/17 2137  . [START ON 03/15/2017] venlafaxine XR (EFFEXOR-XR) 24 hr capsule 75 mg  75 mg Oral Q breakfast Toan Mort B, MD       Facility-Administered Medications Ordered in Other Encounters  Medication Dose Route Frequency Provider Last Rate Last Dose  . gabapentin (NEURONTIN) tablet 300 mg  300 mg Oral TID Bracha Frankowski B, MD      . hydrOXYzine (ATARAX/VISTARIL) tablet 50 mg  50 mg Oral TID PRN Jazzmyn Filion B, MD      . nicotine (NICODERM CQ - dosed in mg/24 hours) patch 21 mg  21 mg Transdermal Q0600 Alfreida Steffenhagen B, MD        Lab Results: No results found for this or any previous visit (from the past 48 hour(s)).  Blood Alcohol level:  Lab Results  Component Value Date   ETH <10  03/12/2017   ETH 264 (H) 27/06/5007    Metabolic Disorder Labs: Lab Results  Component Value Date   HGBA1C 5.2 05/05/2016   MPG 103 05/05/2016   No results found for: PROLACTIN Lab Results  Component Value Date   CHOL 198 05/05/2016   TRIG 92 05/05/2016   HDL 51 05/05/2016   CHOLHDL 3.9 05/05/2016   VLDL 18 05/05/2016   LDLCALC 129 (H) 05/05/2016    Physical Findings: AIMS:  , ,  ,  ,  CIWA:    COWS:     Musculoskeletal: Strength & Muscle Tone: within normal limits Gait & Station: normal Patient leans: N/A  Psychiatric Specialty Exam: Physical Exam  Nursing note reviewed. Psychiatric: Her speech is normal and behavior is normal. Her mood appears anxious. Cognition and memory are normal. She expresses impulsivity. She exhibits a depressed mood. She expresses suicidal ideation. She expresses suicidal plans.    Review of Systems  Psychiatric/Behavioral: Positive for depression and suicidal ideas. The patient is nervous/anxious and has insomnia.   All other systems reviewed and are negative.   Blood pressure 119/73, pulse 65, temperature 98 F (36.7 C), resp. rate 18, height '5\' 1"'  (1.549 m), weight 61.7 kg (136 lb), SpO2 100 %.Body mass index is 25.7 kg/m.  General Appearance: Casual  Eye Contact:  Good  Speech:  Clear and Coherent  Volume:  Normal  Mood:  Anxious and Depressed  Affect:  Congruent  Thought Process:  Goal Directed and Descriptions of Associations: Intact  Orientation:  Full (Time, Place, and Person)  Thought Content:  WDL  Suicidal Thoughts:  Yes.  without intent/plan  Homicidal Thoughts:  No  Memory:  Immediate;   Fair Recent;   Fair Remote;   Fair  Judgement:  Fair  Insight:  Fair  Psychomotor Activity:  Decreased  Concentration:  Concentration: Fair and Attention Span: Fair  Recall:  [employer] of Knowledge:  Fair  Language:  Fair  Akathisia:  No  Handed:  Right  AIMS (if indicated):     Assets:  Communication Skills Desire for  Improvement Housing Physical Health Resilience Social Support  ADL's:  Intact  Cognition:  WNL  Sleep:  Number of Hours: 6.15     Treatment Plan Summary: Daily contact with patient to assess and evaluate symptoms and progress in treatment and Medication management   Ms. Fulgham is a 46 year old female with a history of recurrent depression admitted for suicidal ideation in the context of treatment noncompliance.  #Suicidal ideation, still suicidal -patient is able to contract for safety in the hospital  #Mood, still depressed -lower Effexor to 75 g due to nausea -increase Seroquel to 200 mg nightly  Continue Trileptal 300 mg BID  #Nausea -Compazine 10 mg PRN  #Hypothyroidism -continue Synthroid 100 mcg daily  #Chronic pain -continue Percocet 10 mg every 4-6 hours PRN -continue neurontin 300 mg TID  #Disposition -discharge with family -follow up with [facility]        Orson Slick, MD 03/14/2017, 2:39 PM

## 2017-03-15 DIAGNOSIS — F333 Major depressive disorder, recurrent, severe with psychotic symptoms: Principal | ICD-10-CM

## 2017-03-15 LAB — GASTROINTESTINAL PANEL BY PCR, STOOL (REPLACES STOOL CULTURE)
ADENOVIRUS F40/41: NOT DETECTED
Astrovirus: NOT DETECTED
CAMPYLOBACTER SPECIES: NOT DETECTED
CRYPTOSPORIDIUM: NOT DETECTED
Cyclospora cayetanensis: NOT DETECTED
ENTEROPATHOGENIC E COLI (EPEC): NOT DETECTED
Entamoeba histolytica: NOT DETECTED
Enteroaggregative E coli (EAEC): NOT DETECTED
Enterotoxigenic E coli (ETEC): NOT DETECTED
Giardia lamblia: NOT DETECTED
NOROVIRUS GI/GII: NOT DETECTED
PLESIMONAS SHIGELLOIDES: NOT DETECTED
ROTAVIRUS A: NOT DETECTED
SALMONELLA SPECIES: NOT DETECTED
SHIGELLA/ENTEROINVASIVE E COLI (EIEC): NOT DETECTED
Sapovirus (I, II, IV, and V): NOT DETECTED
Shiga like toxin producing E coli (STEC): NOT DETECTED
Vibrio cholerae: NOT DETECTED
Vibrio species: NOT DETECTED
YERSINIA ENTEROCOLITICA: NOT DETECTED

## 2017-03-15 LAB — C DIFFICILE QUICK SCREEN W PCR REFLEX
C DIFFICILE (CDIFF) INTERP: NOT DETECTED
C DIFFICILE (CDIFF) TOXIN: NEGATIVE
C DIFFICLE (CDIFF) ANTIGEN: NEGATIVE

## 2017-03-15 LAB — TSH: TSH: 2.29 u[IU]/mL (ref 0.350–4.500)

## 2017-03-15 MED ORDER — ONDANSETRON HCL 4 MG PO TABS
4.0000 mg | ORAL_TABLET | Freq: Three times a day (TID) | ORAL | Status: DC | PRN
  Filled 2017-03-15: qty 1

## 2017-03-15 MED ORDER — LOPERAMIDE HCL 2 MG PO CAPS
2.0000 mg | ORAL_CAPSULE | ORAL | Status: DC | PRN
  Administered 2017-03-15 – 2017-03-21 (×5): 2 mg via ORAL
  Filled 2017-03-15 (×6): qty 1

## 2017-03-15 MED ORDER — LOPERAMIDE HCL 2 MG PO CAPS
2.0000 mg | ORAL_CAPSULE | Freq: Four times a day (QID) | ORAL | Status: DC | PRN
  Administered 2017-03-15: 2 mg via ORAL
  Filled 2017-03-15: qty 1

## 2017-03-15 MED ORDER — LOPERAMIDE HCL 2 MG PO CAPS: 2.0000 mg | ORAL_CAPSULE | Freq: Once | ORAL | Status: DC

## 2017-03-15 NOTE — Plan of Care (Signed)
Patient continues to endorse passive SI although contracts for safety.  Verbalizes feelings without difficulty.  States that her husband is one of her stressors and she plans to have a talk with him concerning her feelings.  Agreeable to treatment regiment.  Visible in the milieu.  Interacting with peers and staff appropriately.  Smiles and laughs with engagement.   Safety rounds continued for safety.   Progressing Spiritual Needs Ability to function at adequate level 03/15/2017 1150 - Progressing by Elige Radonobb, Ady Heimann B, RN Activity: Interest or engagement in activities will improve 03/15/2017 1150 - Progressing by Elige Radonobb, Ziomara Birenbaum B, RN Education: Knowledge of [place] [facility] Education information/materials will improve 03/15/2017 1150 - Progressing by Elige Radonobb, Mulan Adan B, RN Emotional status will improve 03/15/2017 1150 - Progressing by Elige Radonobb, Alexandar Weisenberger B, RN Mental status will improve 03/15/2017 1150 - Progressing by Elige Radonobb, Roya Gieselman B, RN Verbalization of understanding the information provided will improve 03/15/2017 1150 - Progressing by Elige Radonobb, Treston Coker B, RN Coping: Ability to verbalize frustrations and anger appropriately will improve 03/15/2017 1150 - Progressing by Elige Radonobb, Flynn Gwyn B, RN Ability to demonstrate self-control will improve 03/15/2017 1150 - Progressing by Elige Radonobb, Derrick Orris B, RN Health Behavior/Discharge Planning: Compliance with treatment plan for underlying cause of condition will improve 03/15/2017 1150 - Progressing by Elige Radonobb, Dhalia Zingaro B, RN Safety: Periods of time without injury will increase 03/15/2017 1150 - Progressing by Elige Radonobb, Joangel Vanosdol B, RN Education: Ability to make informed decisions regarding treatment will improve 03/15/2017 1150 - Progressing by Elige Radonobb, Alaysha Jefcoat B, RN Coping: Ability to cope will improve 03/15/2017 1150 - Progressing by Elige Radonobb, Adyn Hoes B, RN Self-Concept: Ability to disclose and discuss suicidal ideas will improve 03/15/2017 1150 - Progressing by Elige Radonobb,  Rayfield Beem B, RN Ability to verbalize positive feelings about self will improve 03/15/2017 1150 - Progressing by Elige Radonobb, Nichollas Perusse B, RN Activity: Interest or engagement in leisure activities will improve 03/15/2017 1150 - Progressing by Elige Radonobb, Dorathy Stallone B, RN Coping: Ability to cope will improve 03/15/2017 1150 - Progressing by Elige Radonobb, Fidelia Cathers B, RN Ability to verbalize feelings will improve 03/15/2017 1150 - Progressing by Elige Radonobb, Euel Castile B, RN Safety: Ability to disclose and discuss suicidal ideas will improve 03/15/2017 1150 - Progressing by Elige Radonobb, Kowen Kluth B, RN Ability to identify and utilize support systems that promote safety will improve 03/15/2017 1150 - Progressing by Elige Radonobb, Reakwon Barren B, RN Safety: Ability to remain free from injury will improve 03/15/2017 1150 - Progressing by Elige Radonobb, Evany Schecter B, RN

## 2017-03-15 NOTE — Progress Notes (Signed)
D: Patient is alert and oriented x 4, no distress noted, denies SI/HI/AVH, mood is pleasant and cooperative with treatment plan. Patient's thoughts are organized, she appears less anxious, although apprehensive about having a room mate and as such she was move to room 320. Patient is interacting with staff appropriately.  A: Pt was offered support and encouragement, given scheduled medications, and   encouraged to attend groups. 15 minute checks were done for safety.  R:Pt did attend group and interacted well with peers and staff. Patient is compliant with medication. 15 minutes safety rounds maintained on unit.

## 2017-03-15 NOTE — [facility] Group Notes (Signed)
[facility] Group Notes:  (Nursing/MHT/Case Management/Adjunct)  Date:  03/15/2017  Time:  1:39 AM  Type of Therapy:  Group Therapy  Participation Level:  Active  Participation Quality:  Supportive  Affect:  Appropriate  Cognitive:  Appropriate  Insight:  Good  Engagement in Group:  Engaged  Modes of Intervention:  Support  Summary of Progress/Problems:  Mayra NeerJackie L Jacub Waiters 03/15/2017, 1:39 AM

## 2017-03-15 NOTE — Consult Note (Signed)
[facility] [place] [facility]   PATIENT NAME: Victoria Galvan    MR#:  [id number]  DATE OF BIRTH:  1971/02/24  DATE OF ADMISSION:  03/12/2017  PRIMARY CARE PHYSICIAN: Herma CarsonMandry, Heidi, MD   REQUESTING/REFERRING PHYSICIAN: Aundria Rudakesh, Gopalkumar, MD  CHIEF COMPLAINT:  No chief complaint on file. Diarrhea  HISTORY OF PRESENT ILLNESS:  Victoria Galvan  is a 46 y.o. female with a known history of depression admitted for suicidal ideation. This morning she endorsed that she had 4-5 instances of loose stools starting from 2 AM this morning.  The stools are watery and foul-smelling and accompanied by some degree of abdominal pain and cramps.  WBC count on 19 November was 22.  This was also accompanied by neutrophilia, elevated lymphocytes and monocytes. She denies any abdominal pain. PAST MEDICAL HISTORY:   Past Medical History:  Diagnosis Date  . Anxiety disorder    with panic  . Cervical spinal stenosis    spondylosis  . Chronic nausea    likely from chronic narcotic pain med use  . Chronic neck pain   . Colon polyps    Pt states "colon polyp was removed when they did my hysterectomy"--need old records to veriffy.  . Connective tissue disease (HCC)    Dr. Malena CatholicHawkes--rheum.  Started plaquenil 09/2014.  Marland Kitchen. Depression   . Fibromyalgia    per pt report  . History of cervical cancer   . History of frequent urinary tract infections   . Hypothyroidism    med noncompliance  . Positive ANA (antinuclear antibody)   . Tobacco dependence     PAST SURGICAL HISTOIRY:   Past Surgical History:  Procedure Laterality Date  . Adhesions removed  2008  . ESOPHAGOGASTRODUODENOSCOPY  2005   mild gastritis; h pylori neg.  Says she has had esoph dilation several times  . holter monitor  2004   Normal  . MRI brain  2002;2004   Normal  . OOPHORECTOMY  2007   right-path neg  . OOPHORECTOMY  2012   left  . TONSILLECTOMY  1978  . TRANSTHORACIC ECHOCARDIOGRAM  2004   Normal  .  VAGINAL HYSTERECTOMY  1999   for cervical cancer per pt    SOCIAL HISTORY:   Social History   Tobacco Use  . Smoking status: Current Every Day Smoker    Packs/day: 0.50    Types: Cigarettes  . Smokeless tobacco: Never Used  Substance Use Topics  . Alcohol use: No    Alcohol/week: 0.0 oz    Frequency: Never    Comment: occasionally    FAMILY HISTORY:   Family History  Problem Relation Age of Onset  . Arthritis Mother   . Cancer Mother        Lung  . Arthritis Father     DRUG ALLERGIES:   Allergies  Allergen Reactions  . Aspirin Other (See Comments)    Unknown rx; this allergy was simply noted in former PCP's records    REVIEW OF SYSTEMS:  CONSTITUTIONAL: No fever, fatigue or weakness.  EYES: No blurred or double vision.  EARS, NOSE, AND THROAT: No tinnitus or ear pain.  RESPIRATORY: No cough, shortness of breath, wheezing or hemoptysis.  CARDIOVASCULAR: No chest pain, orthopnea, edema.  GASTROINTESTINAL: No nausea, vomiting, or abdominal pain. diarrhea + GENITOURINARY: No dysuria, hematuria.  ENDOCRINE: No polyuria, nocturia,  HEMATOLOGY: No anemia, easy bruising or bleeding SKIN: No rash or lesion. MUSCULOSKELETAL: No joint pain or arthritis.   NEUROLOGIC: No  tingling, numbness, weakness.  PSYCHIATRY: No anxiety or depression.  MEDICATIONS AT HOME:   Prior to Admission medications   Medication Sig Start Date End Date Taking? Authorizing Provider  gabapentin (NEURONTIN) 300 MG capsule Take 1 capsule (300 mg total) by mouth 4 (four) times daily - after meals and at bedtime. Patient taking differently: Take 600 mg at bedtime by mouth.  05/09/16   Adonis Brook, NP  hydrOXYzine (ATARAX/VISTARIL) 50 MG tablet Take 1 tablet (50 mg total) by mouth every 6 (six) hours as needed for anxiety. 05/09/16   Adonis Brook, NP  levothyroxine (SYNTHROID, LEVOTHROID) 100 MCG tablet Take 50 mcg daily before breakfast by mouth.    [provider]    oxyCODONE-acetaminophen (PERCOCET) 10-325 MG tablet Take 1 tablet every 4 (four) hours as needed by mouth for pain (RA).    [provider]      VITAL SIGNS:  Blood pressure 121/80, pulse 77, temperature 98.2 F (36.8 C), temperature source Oral, resp. rate 18, height 5\' 1"  (1.549 m), weight 61.7 kg (136 lb), SpO2 100 %.  PHYSICAL EXAMINATION:  GENERAL:  46 y.o.-year-old patient lying in the bed with no acute distress.  EYES: Pupils equal, round, reactive to light and accommodation. No scleral icterus. Extraocular muscles intact.  HEENT: Head atraumatic, normocephalic. Oropharynx and nasopharynx clear.  NECK:  Supple, no jugular venous distention. No thyroid enlargement, no tenderness.  LUNGS: Normal breath sounds bilaterally, no wheezing, rales,rhonchi or crepitation. No use of accessory muscles of respiration.  CARDIOVASCULAR: S1, S2 normal. No murmurs, rubs, or gallops.  ABDOMEN: Soft, nontender, nondistended. Bowel sounds present. No organomegaly or mass.  EXTREMITIES: No pedal edema, cyanosis, or clubbing.  NEUROLOGIC: Cranial nerves II through XII are intact. Muscle strength 5/5 in all extremities. Sensation intact. Gait not checked.  PSYCHIATRIC: The patient is alert and oriented x 3.  SKIN: No obvious rash, lesion, or ulcer.  LABORATORY PANEL:   CBC Recent Labs  Lab 03/12/17 0014  WBC 22.0*  HGB 14.3  HCT 42.5  PLT 376   ------------------------------------------------------------------------------------------------------------------  Chemistries  Recent Labs  Lab 03/12/17 0014  NA 137  K 3.6  CL 103  CO2 24  GLUCOSE 93  BUN 17  CREATININE 0.85  CALCIUM 9.8  AST 20  ALT 10*  ALKPHOS 115  BILITOT 0.5   ------------------------------------------------------------------------------------------------------------------    IMPRESSION AND PLAN:  46 year old female seen for acute onset diarrhea  * Acute onset Diarrhea - will check GI Panel, C.diff,  check TSH - Just resulted and they r all Neg.  - start Imodium for symptomatic mgmt - if Symptoms continue - can look into all her meds (but unlikely this would cause acute onset diarrhea if she has been all these meds chronically - Effexor is reported to cause diarrhea 8% of time per Uptodate) and have any other associated symptoms like Abd pain/Fever (please note she didn't report much Abd pain to me) - Consider CT abd-pelvis  * Leukocytosis - Unsure etio, Monitor - UA & CXR neg  * Hypothyroidism - continue synthroid, check TSH  * Depression - Continue Effexor, Seroquel and Trileptal per Psych      All the records are reviewed and case discussed with Consulting provider. Management plans discussed with the patient, nursing and they are in agreement.  CODE STATUS: FULL CODE  TOTAL TIME TAKING CARE OF THIS PATIENT: 35 minutes.    Delfino Lovett M.D on 03/15/2017 at 2:51 PM  Between 7am to 6pm - Pager - [phone]  After 6pm go to [email or web address] - Social research officer, governmentpassword [email or web address] [email or web address]  [facility]  Office  [phone]  CC: Primary care Physician: Herma CarsonMandry, Heidi, MD   Note: This dictation was prepared with Dragon dictation along with smaller phrase technology. Any transcriptional errors that result from this process are unintentional.

## 2017-03-15 NOTE — Plan of Care (Signed)
  Activity: Interest or engagement in activities will improve 03/15/2017 0403 - Progressing by Trula OreAjetunmobi, Irva Loser Abisola, RN   Education: Mental status will improve 03/15/2017 0403 - Progressing by Trula OreAjetunmobi, Hisayo Delossantos Abisola, RN

## 2017-03-15 NOTE — Progress Notes (Addendum)
[facility]  03/15/2017 2:40 PM Victoria Royalsva I Ruffolo  MRN:  [id number]  Subjective:   Victoria Galvan is a 46 year old female with a history of depression admitted for suicidal ideation. This morning she endorsed that she had 4-5 instances of loose stools starting from 2 AM this morning.  The stools are watery and foul-smelling and accompanied by some degree of abdominal pain and cramps.  WBC count on 19 November was 22.  This was also accompanied by neutrophilia, elevated lymphocytes and monocytes.  Spoke to hospitalist on call and we ordered a stool PCR which was unremarkable.    Treatment plan. We continue Seroquel 200 mg nightly and Trileptal 300 mg BID for mood stabilization and Effexor for depression/anxiety.  Social/disposition. She will return home, [facility]  Principal Problem: Major depressive disorder, recurrent, severe with psychotic features (HCC) Diagnosis:   Patient Active Problem List   Diagnosis Date Noted  . Major depressive disorder, recurrent, severe with psychotic features (HCC) [F33.3] 03/12/2017  . Suicidal ideations [R45.851]   . MDD (major depressive disorder), recurrent episode, severe (HCC) [F33.2] 05/03/2016  . GAD (generalized anxiety disorder) [F41.1] 05/13/2015  . Myalgia [M79.10] 08/07/2014  . Positive ANA (antinuclear antibody) [R76.8] 08/07/2014  . Hypothyroidism [E03.9] 08/07/2014   Total Time spent with patient: 30 minutes  Past Psychiatric History: depression and anxiety.  Past Medical History:  Past Medical History:  Diagnosis Date  . Anxiety disorder    with panic  . Cervical spinal stenosis    spondylosis  . Chronic nausea    likely from chronic narcotic pain med use  . Chronic neck pain   . Colon polyps    Pt states "colon polyp was removed when they did my hysterectomy"--need old records to veriffy.  . Connective tissue disease (HCC)    Dr. Malena CatholicHawkes--rheum.  Started plaquenil 09/2014.  Marland Kitchen. Depression   . Fibromyalgia    per  pt report  . History of cervical cancer   . History of frequent urinary tract infections   . Hypothyroidism    med noncompliance  . Positive ANA (antinuclear antibody)   . Tobacco dependence     Past Surgical History:  Procedure Laterality Date  . Adhesions removed  2008  . ESOPHAGOGASTRODUODENOSCOPY  2005   mild gastritis; h pylori neg.  Says she has had esoph dilation several times  . holter monitor  2004   Normal  . MRI brain  2002;2004   Normal  . OOPHORECTOMY  2007   right-path neg  . OOPHORECTOMY  2012   left  . TONSILLECTOMY  1978  . TRANSTHORACIC ECHOCARDIOGRAM  2004   Normal  . VAGINAL HYSTERECTOMY  1999   for cervical cancer per pt   Family History:  Family History  Problem Relation Age of Onset  . Arthritis Mother   . Cancer Mother        Lung  . Arthritis Father    Family Psychiatric  History: depression and anxiety. Social History:  Social History   Substance and Sexual Activity  Alcohol Use No  . Alcohol/week: 0.0 oz  . Frequency: Never   Comment: occasionally     Social History   Substance and Sexual Activity  Drug Use No    Social History   Socioeconomic History  . Marital status: Married    Spouse name: None  . Number of children: 2  . Years of education: College  . Highest education level: None  Social Needs  . Financial  resource strain: None  . Food insecurity - worry: None  . Food insecurity - inability: None  . Transportation needs - medical: None  . Transportation needs - non-medical: None  Occupational History  . Occupation: Scientist, research (medical): UNEMPLOYED  Tobacco Use  . Smoking status: Current Every Day Smoker    Packs/day: 0.50    Types: Cigarettes  . Smokeless tobacco: Never Used  Substance and Sexual Activity  . Alcohol use: No    Alcohol/week: 0.0 oz    Frequency: Never    Comment: occasionally  . Drug use: No  . Sexual activity: Yes    Birth control/protection: Surgical  Other Topics Concern  . None  Social  History Narrative   Married.   Education: GED, Copywriter, advertising.   Occupation: unemployed, formerly worked as a Lawyer.   Additional Social History:                         Sleep: Fair  Appetite:  Fair  Current Medications: Current Facility-Administered Medications  Medication Dose Route Frequency Provider Last Rate Last Dose  . acetaminophen (TYLENOL) tablet 650 mg  650 mg Oral Q6H PRN Pucilowska, Jolanta B, MD   650 mg at 03/13/17 0949  . alum & mag hydroxide-simeth (MAALOX/MYLANTA) 200-200-20 MG/5ML suspension 30 mL  30 mL Oral Q4H PRN Pucilowska, Jolanta B, MD      . gabapentin (NEURONTIN) capsule 300 mg  300 mg Oral TID Pucilowska, Jolanta B, MD   300 mg at 03/14/17 1628  . levothyroxine (SYNTHROID, LEVOTHROID) tablet 100 mcg  100 mcg Oral QAC breakfast Pucilowska, Jolanta B, MD   100 mcg at 03/15/17 0828  . magnesium hydroxide (MILK OF MAGNESIA) suspension 30 mL  30 mL Oral Daily PRN Pucilowska, Jolanta B, MD      . ondansetron (ZOFRAN) tablet 4 mg  4 mg Oral Q8H PRN Aundria Rud, MD      . Oxcarbazepine (TRILEPTAL) tablet 300 mg  300 mg Oral BID Pucilowska, Jolanta B, MD   300 mg at 03/15/17 0829  . oxyCODONE-acetaminophen (PERCOCET/ROXICET) 5-325 MG per tablet 2 tablet  2 tablet Oral Q4H PRN Pucilowska, Jolanta B, MD   2 tablet at 03/15/17 1229  . QUEtiapine (SEROQUEL) tablet 200 mg  200 mg Oral QHS Pucilowska, Jolanta B, MD   200 mg at 03/14/17 2138  . venlafaxine XR (EFFEXOR-XR) 24 hr capsule 75 mg  75 mg Oral Q breakfast Pucilowska, Jolanta B, MD   75 mg at 03/15/17 1610   Facility-Administered Medications Ordered in Other Encounters  Medication Dose Route Frequency Provider Last Rate Last Dose  . gabapentin (NEURONTIN) tablet 300 mg  300 mg Oral TID Pucilowska, Jolanta B, MD      . hydrOXYzine (ATARAX/VISTARIL) tablet 50 mg  50 mg Oral TID PRN Pucilowska, Jolanta B, MD      . nicotine (NICODERM CQ - dosed in mg/24 hours) patch 21 mg  21 mg Transdermal Q0600  Pucilowska, Jolanta B, MD        Lab Results:  Results for orders placed or performed during the hospital encounter of 03/12/17 (from the past 48 hour(s))  Gastrointestinal Panel by PCR , Stool     Status: None   Collection Time: 03/15/17 10:53 AM  Result Value Ref Range   Campylobacter species NOT DETECTED NOT DETECTED   Plesimonas shigelloides NOT DETECTED NOT DETECTED   Salmonella species NOT DETECTED NOT DETECTED   Yersinia enterocolitica NOT DETECTED NOT  DETECTED   Vibrio species NOT DETECTED NOT DETECTED   Vibrio cholerae NOT DETECTED NOT DETECTED   Enteroaggregative E coli (EAEC) NOT DETECTED NOT DETECTED   Enteropathogenic E coli (EPEC) NOT DETECTED NOT DETECTED   Enterotoxigenic E coli (ETEC) NOT DETECTED NOT DETECTED   Shiga like toxin producing E coli (STEC) NOT DETECTED NOT DETECTED   Shigella/Enteroinvasive E coli (EIEC) NOT DETECTED NOT DETECTED   Cryptosporidium NOT DETECTED NOT DETECTED   Cyclospora cayetanensis NOT DETECTED NOT DETECTED   Entamoeba histolytica NOT DETECTED NOT DETECTED   Giardia lamblia NOT DETECTED NOT DETECTED   Adenovirus F40/41 NOT DETECTED NOT DETECTED   Astrovirus NOT DETECTED NOT DETECTED   Norovirus GI/GII NOT DETECTED NOT DETECTED   Rotavirus A NOT DETECTED NOT DETECTED   Sapovirus (I, II, IV, and V) NOT DETECTED NOT DETECTED  C difficile quick scan w PCR reflex     Status: None   Collection Time: 03/15/17 10:53 AM  Result Value Ref Range   C Diff antigen NEGATIVE NEGATIVE   C Diff toxin NEGATIVE NEGATIVE   C Diff interpretation No C. difficile detected.     Blood Alcohol level:  Lab Results  Component Value Date   ETH <10 03/12/2017   ETH 264 (H) 05/02/2016    Metabolic Disorder Labs: Lab Results  Component Value Date   HGBA1C 5.2 05/05/2016   MPG 103 05/05/2016   No results found for: PROLACTIN Lab Results  Component Value Date   CHOL 198 05/05/2016   TRIG 92 05/05/2016   HDL 51 05/05/2016   CHOLHDL 3.9 05/05/2016    VLDL 18 05/05/2016   LDLCALC 129 (H) 05/05/2016    Physical Findings: AIMS:  , ,  ,  ,    CIWA:    COWS:     Musculoskeletal: Strength & Muscle Tone: within normal limits Gait & Station: normal Patient leans: N/A  Psychiatric Specialty Exam: Physical Exam  Nursing note reviewed. Psychiatric: Her speech is normal and behavior is normal. Her mood appears anxious. Cognition and memory are normal. She expresses impulsivity. She exhibits a depressed mood.    Review of Systems  Psychiatric/Behavioral: Positive for depression. The patient is nervous/anxious.   All other systems reviewed and are negative.   Blood pressure 121/80, pulse 77, temperature 98.2 F (36.8 C), temperature source Oral, resp. rate 18, height 5\' 1"  (1.549 m), weight 136 lb (61.7 kg), SpO2 100 %.Body mass index is 25.7 kg/m.  General Appearance: Casual  Eye Contact:  Good  Speech:  Clear and Coherent  Volume:  Normal  Mood:  Anxious and Depressed  Affect:  Congruent  Thought Process:  Goal Directed and Descriptions of Associations: Intact  Orientation:  Full (Time, Place, and Person)  Thought Content:  WDL  Suicidal Thoughts:  Yes.  without intent/plan  Homicidal Thoughts:  No  Memory:  Immediate;   Fair Recent;   Fair Remote;   Fair  Judgement:  Fair  Insight:  Fair  Psychomotor Activity:  Decreased  Concentration:  Concentration: Fair and Attention Span: Fair  Recall:  FiservFair  Fund of Knowledge:  Fair  Language:  Fair  Akathisia:  No  Handed:  Right  AIMS (if indicated):     Assets:  Communication Skills Desire for Improvement Housing Physical Health Resilience Social Support  ADL's:  Intact  Cognition:  WNL  Sleep:  Number of Hours: 7     Treatment Plan Summary: Daily contact with patient to assess and evaluate symptoms and progress  in treatment and Medication management   Victoria Galvan is a 46 year old female with a history of recurrent depression admitted for suicidal ideation in the  context of treatment noncompliance.  She has had episodes of loose stool since this morning, accompanied by cramping abdominal pain.  It is possible that this is a side effect of Effexor and other possible differential diagnosis being an infectious process.  We have consulted the hospitalist team and will wait for their recommendations.  #Suicidal ideation, still suicidal -patient is able to contract for safety in the hospital  #Mood, still depressed -lower Effexor to 75 g due to nausea -increase Seroquel to 200 mg nightly  Continue Trileptal 300 mg BID  #Nausea -Zofran 4 mg every 8 hours as needed  #Hypothyroidism -continue Synthroid 100 mcg daily  #Chronic pain -continue Percocet 10 mg every 4-6 hours PRN -continue neurontin 300 mg TID  #Diarrhea -Being worked up -Recommend rehydration -We will await further recs from GI -Skip Effexor dose tomorrow morning -We will get CT abdomen if no relief from diarrhea tomorrow  #Disposition -discharge with family -follow up with [facility]  Aundria Rud, MD 03/15/2017, 2:40 PM

## 2017-03-15 NOTE — Progress Notes (Signed)
Patient presents to medication room stating "I have had 4 loose stools since you gave me that medicine."  Dr. Teryl Lucyakeesh informed.

## 2017-03-16 LAB — CBC
HEMATOCRIT: 39.4 % (ref 35.0–47.0)
HEMOGLOBIN: 13.3 g/dL (ref 12.0–16.0)
MCH: 30.6 pg (ref 26.0–34.0)
MCHC: 33.9 g/dL (ref 32.0–36.0)
MCV: 90.4 fL (ref 80.0–100.0)
PLATELETS: 256 10*3/uL (ref 150–440)
RBC: 4.35 MIL/uL (ref 3.80–5.20)
RDW: 13.6 % (ref 11.5–14.5)
WBC: 8.2 10*3/uL (ref 3.6–11.0)

## 2017-03-16 LAB — COMPREHENSIVE METABOLIC PANEL
ALBUMIN: 4.2 g/dL (ref 3.5–5.0)
ALT: 17 U/L (ref 14–54)
AST: 23 U/L (ref 15–41)
Alkaline Phosphatase: 119 U/L (ref 38–126)
Anion gap: 8 (ref 5–15)
BUN: 13 mg/dL (ref 6–20)
CHLORIDE: 102 mmol/L (ref 101–111)
CO2: 27 mmol/L (ref 22–32)
CREATININE: 0.82 mg/dL (ref 0.44–1.00)
Calcium: 9.3 mg/dL (ref 8.9–10.3)
GFR calc Af Amer: >60 mL/min (ref >60)
GLUCOSE: 94 mg/dL (ref 65–99)
POTASSIUM: 4.6 mmol/L (ref 3.5–5.1)
SODIUM: 137 mmol/L (ref 135–145)
Total Bilirubin: 0.5 mg/dL (ref 0.3–1.2)
Total Protein: 7.5 g/dL (ref 6.5–8.1)

## 2017-03-16 MED ORDER — RISAQUAD PO CAPS
2.0000 | ORAL_CAPSULE | Freq: Three times a day (TID) | ORAL | Status: DC
  Administered 2017-03-16 – 2017-03-21 (×14): 2 via ORAL
  Filled 2017-03-16 (×18): qty 2

## 2017-03-16 NOTE — Plan of Care (Signed)
Patient is alert and oriented x 4, no distress noted, denies SI/HI/AVH, mood is pleasant and cooperative with treatment plan. Patient's thoughts are organized, she appears less anxious. Lab results for c-diff negative, patient taken off of enteric precaution per MD. Up in milieu interacting appropriately with peers. Will inform MD if any acute changes occur. 

## 2017-03-16 NOTE — Progress Notes (Signed)
Patient ID: Victoria Galvan, female   DOB: 1970/10/07, 46 y.o.   MRN: [id number] Observed in the day area watching TV, enteric contact isolation education provided, "I always wash my hand and sing until soapy hands are cleaned.." Unable to contain and isolate to her room until r/o c'diff. Patient denied SI/SIB/HI/AVH, pleasant on approach, c/o having diarrhea, c/o 8/10 generalized aching pain; requested and received Imodium 2 mg Capsule and Percocet 2 tabs at 2127; patient was reminded to allow nursing staffs to assess loose stool before flushing.

## 2017-03-16 NOTE — Progress Notes (Signed)
Recreation Therapy Notes  Date: 11.23.18  Time: 1:00 pm  Location: Craft Room  Behavioral response: Appropriate  Intervention Topic: Communication   Discussion/Intervention: Group content today was focused on communication. The group defined communication and ways to communicate with others. Individuals stated reason why communication is important and some reasons to communicate with others. Patients expressed if they thought they were good at communicating with others and ways they could improve their communication skills. The group identified important parts of communication and some experiences they have had in the past with communication. The group participated in the intervention "Back to Back", where they had a chance to test out their communication skills and identify ways to improve their communication techniques.  Clinical Observations/Feedback:  Patient came to group and expressed that when others use profanity she normally like to stop communicating. She participated in the intervention and was social with peers and staff during group.  Victoria Galvan LRT/CTRS         Victoria Galvan 03/16/2017 2:08 PM

## 2017-03-16 NOTE — Progress Notes (Addendum)
[facility] MD Progress Note  03/16/2017 2:13 PM Victoria Galvan  MRN:  [id number]  Subjective:   Victoria Galvan is a 46 year old female with a history of depression admitted for suicidal ideation. The instances of diarrhea seem to have decreased in frequency and severity.  Patient has been rehydrating herself.  She skipped the venlafaxine dose this morning.  Describes that she continues to be anxious but is more concerned about what could be causing her diarrhea.  Treatment plan. We continue Seroquel 200 mg nightly and Trileptal 300 mg BID for mood stabilization .  We will continue to hold off Effexor for now. Social/disposition. She will return home, follow up with [facility].  Principal Problem: Major depressive disorder, recurrent, severe with psychotic features ([place]) Diagnosis:   Patient Active Problem List   Diagnosis Date Noted  . Major depressive disorder, recurrent, severe with psychotic features ([place]) [F33.3] 03/12/2017  . Suicidal ideations [R45.851]   . MDD (major depressive disorder), recurrent episode, severe ([place]) [F33.2] 05/03/2016  . GAD (generalized anxiety disorder) [F41.1] 05/13/2015  . Myalgia [M79.10] 08/07/2014  . Positive ANA (antinuclear antibody) [R76.8] 08/07/2014  . Hypothyroidism [E03.9] 08/07/2014   Total Time spent with patient: 30 minutes  Past Psychiatric History: depression and anxiety.  Past Medical History:  Past Medical History:  Diagnosis Date  . Anxiety disorder    with panic  . Cervical spinal stenosis    spondylosis  . Chronic nausea    likely from chronic narcotic pain med use  . Chronic neck pain   . Colon polyps    Pt states "colon polyp was removed when they did my hysterectomy"--need old records to veriffy.  . Connective tissue disease ([place])    Dr. Philis Pique.  Started plaquenil 09/2014.  Marland Kitchen Depression   . Fibromyalgia    per pt report  . History of cervical cancer   . History of frequent urinary tract infections   . Hypothyroidism    med  noncompliance  . Positive ANA (antinuclear antibody)   . Tobacco dependence     Past Surgical History:  Procedure Laterality Date  . Adhesions removed  2008  . ESOPHAGOGASTRODUODENOSCOPY  2005   mild gastritis; h pylori neg.  Says she has had esoph dilation several times  . holter monitor  2004   Normal  . MRI brain  2002;2004   Normal  . OOPHORECTOMY  2007   right-path neg  . OOPHORECTOMY  2012   left  . TONSILLECTOMY  1978  . TRANSTHORACIC ECHOCARDIOGRAM  2004   Normal  . VAGINAL HYSTERECTOMY  1999   for cervical cancer per pt   Family History:  Family History  Problem Relation Age of Onset  . Arthritis Mother   . Cancer Mother        Lung  . Arthritis Father    Family Psychiatric  History: depression and anxiety. Social History:  Social History   Substance and Sexual Activity  Alcohol Use No  . Alcohol/week: 0.0 oz  . Frequency: Never   Comment: occasionally     Social History   Substance and Sexual Activity  Drug Use No    Social History   Socioeconomic History  . Marital status: Married    Spouse name: None  . Number of children: 2  . Years of education: College  . Highest education level: None  Social Needs  . Financial resource strain: None  . Food insecurity - worry: None  . Food insecurity - inability: None  . Transportation  needs - medical: None  . Transportation needs - non-medical: None  Occupational History  . Occupation: Forensic psychologist: UNEMPLOYED  Tobacco Use  . Smoking status: Current Every Day Smoker    Packs/day: 0.50    Types: Cigarettes  . Smokeless tobacco: Never Used  Substance and Sexual Activity  . Alcohol use: No    Alcohol/week: 0.0 oz    Frequency: Never    Comment: occasionally  . Drug use: No  . Sexual activity: Yes    Birth control/protection: Surgical  Other Topics Concern  . None  Social History Narrative   Married.   Education: GED, Psychologist, occupational.   Occupation: unemployed, formerly worked as a Quarry manager.    Additional Social History:                         Sleep: Fair  Appetite:  Fair  Current Medications: Current Facility-Administered Medications  Medication Dose Route Frequency Provider Last Rate Last Dose  . acetaminophen (TYLENOL) tablet 650 mg  650 mg Oral Q6H PRN Pucilowska, Jolanta B, MD   650 mg at 03/13/17 0949  . acidophilus (RISAQUAD) capsule 2 capsule  2 capsule Oral TID Nicholes Mango, MD   2 capsule at 03/16/17 1143  . gabapentin (NEURONTIN) capsule 300 mg  300 mg Oral TID Pucilowska, Jolanta B, MD   300 mg at 03/16/17 0842  . levothyroxine (SYNTHROID, LEVOTHROID) tablet 100 mcg  100 mcg Oral QAC breakfast Pucilowska, Jolanta B, MD   100 mcg at 03/16/17 0842  . loperamide (IMODIUM) capsule 2 mg  2 mg Oral Q4H PRN Ramond Dial, MD   2 mg at 03/16/17 0631  . ondansetron (ZOFRAN) tablet 4 mg  4 mg Oral Q8H PRN Ramond Dial, MD      . Oxcarbazepine (TRILEPTAL) tablet 300 mg  300 mg Oral BID Pucilowska, Jolanta B, MD   300 mg at 03/16/17 0842  . oxyCODONE-acetaminophen (PERCOCET/ROXICET) 5-325 MG per tablet 2 tablet  2 tablet Oral Q4H PRN Pucilowska, Jolanta B, MD   2 tablet at 03/16/17 1048  . QUEtiapine (SEROQUEL) tablet 200 mg  200 mg Oral QHS Pucilowska, Jolanta B, MD   200 mg at 03/15/17 2138   Facility-Administered Medications Ordered in Other Encounters  Medication Dose Route Frequency Provider Last Rate Last Dose  . gabapentin (NEURONTIN) tablet 300 mg  300 mg Oral TID Pucilowska, Jolanta B, MD      . hydrOXYzine (ATARAX/VISTARIL) tablet 50 mg  50 mg Oral TID PRN Pucilowska, Jolanta B, MD      . nicotine (NICODERM CQ - dosed in mg/24 hours) patch 21 mg  21 mg Transdermal Q0600 Pucilowska, Jolanta B, MD        Lab Results:  Results for orders placed or performed during the hospital encounter of 03/12/17 (from the past 48 hour(s))  Gastrointestinal Panel by PCR , Stool     Status: None   Collection Time: 03/15/17 10:53 AM  Result Value Ref Range    Campylobacter species NOT DETECTED NOT DETECTED   Plesimonas shigelloides NOT DETECTED NOT DETECTED   Salmonella species NOT DETECTED NOT DETECTED   Yersinia enterocolitica NOT DETECTED NOT DETECTED   Vibrio species NOT DETECTED NOT DETECTED   Vibrio cholerae NOT DETECTED NOT DETECTED   Enteroaggregative E coli (EAEC) NOT DETECTED NOT DETECTED   Enteropathogenic E coli (EPEC) NOT DETECTED NOT DETECTED   Enterotoxigenic E coli (ETEC) NOT DETECTED NOT DETECTED   Shiga like toxin  producing E coli (STEC) NOT DETECTED NOT DETECTED   Shigella/Enteroinvasive E coli (EIEC) NOT DETECTED NOT DETECTED   Cryptosporidium NOT DETECTED NOT DETECTED   Cyclospora cayetanensis NOT DETECTED NOT DETECTED   Entamoeba histolytica NOT DETECTED NOT DETECTED   Giardia lamblia NOT DETECTED NOT DETECTED   Adenovirus F40/41 NOT DETECTED NOT DETECTED   Astrovirus NOT DETECTED NOT DETECTED   Norovirus GI/GII NOT DETECTED NOT DETECTED   Rotavirus A NOT DETECTED NOT DETECTED   Sapovirus (I, II, IV, and V) NOT DETECTED NOT DETECTED  C difficile quick scan w PCR reflex     Status: None   Collection Time: 03/15/17 10:53 AM  Result Value Ref Range   C Diff antigen NEGATIVE NEGATIVE   C Diff toxin NEGATIVE NEGATIVE   C Diff interpretation No C. difficile detected.   TSH     Status: None   Collection Time: 03/15/17  3:30 PM  Result Value Ref Range   TSH 2.290 0.350 - 4.500 uIU/mL    Comment: Performed by a 3rd Generation assay with a functional sensitivity of <=0.01 uIU/mL.  CBC     Status: None   Collection Time: 03/16/17  7:12 AM  Result Value Ref Range   WBC 8.2 3.6 - 11.0 K/uL   RBC 4.35 3.80 - 5.20 MIL/uL   Hemoglobin 13.3 12.0 - 16.0 g/dL   HCT 39.4 35.0 - 47.0 %   MCV 90.4 80.0 - 100.0 fL   MCH 30.6 26.0 - 34.0 pg   MCHC 33.9 32.0 - 36.0 g/dL   RDW 13.6 11.5 - 14.5 %   Platelets 256 150 - 440 K/uL  Comprehensive metabolic panel     Status: None   Collection Time: 03/16/17  7:12 AM  Result Value Ref  Range   Sodium 137 135 - 145 mmol/L   Potassium 4.6 3.5 - 5.1 mmol/L   Chloride 102 101 - 111 mmol/L   CO2 27 22 - 32 mmol/L   Glucose, Bld 94 65 - 99 mg/dL   BUN 13 6 - 20 mg/dL   Creatinine, Ser 0.82 0.44 - 1.00 mg/dL   Calcium 9.3 8.9 - 10.3 mg/dL   Total Protein 7.5 6.5 - 8.1 g/dL   Albumin 4.2 3.5 - 5.0 g/dL   AST 23 15 - 41 U/L   ALT 17 14 - 54 U/L   Alkaline Phosphatase 119 38 - 126 U/L   Total Bilirubin 0.5 0.3 - 1.2 mg/dL   GFR calc non Af Amer >60 >60 mL/min   GFR calc Af Amer >60 >60 mL/min    Comment: (NOTE) The eGFR has been calculated using the CKD EPI equation. This calculation has not been validated in all clinical situations. eGFR's persistently <60 mL/min signify possible Chronic Kidney Disease.    Anion gap 8 5 - 15    Blood Alcohol level:  Lab Results  Component Value Date   ETH <10 03/12/2017   ETH 264 (H) 02/40/9735    Metabolic Disorder Labs: Lab Results  Component Value Date   HGBA1C 5.2 05/05/2016   MPG 103 05/05/2016   No results found for: PROLACTIN Lab Results  Component Value Date   CHOL 198 05/05/2016   TRIG 92 05/05/2016   HDL 51 05/05/2016   CHOLHDL 3.9 05/05/2016   VLDL 18 05/05/2016   LDLCALC 129 (H) 05/05/2016    Physical Findings: AIMS:  , ,  ,  ,    CIWA:    COWS:     Musculoskeletal:  Strength & Muscle Tone: within normal limits Gait & Station: normal Patient leans: N/A  Psychiatric Specialty Exam: Physical Exam  Nursing note reviewed. Psychiatric: Her speech is normal and behavior is normal. Her mood appears anxious. Cognition and memory are normal. She exhibits a depressed mood.    Review of Systems  Psychiatric/Behavioral: Positive for depression. The patient is nervous/anxious.   All other systems reviewed and are negative.   Blood pressure 112/73, pulse 68, temperature 98 F (36.7 C), temperature source Oral, resp. rate 18, height _0  (1.549 m), weight 136 lb (61.7 kg), SpO2 100 %.Body mass index is 25.7  kg/m.  General Appearance: Casual  Eye Contact:  Good  Speech:  Clear and Coherent  Volume:  Normal  Mood:  Anxious and Depressed  Affect:  Congruent  Thought Process:  Goal Directed and Descriptions of Associations: Intact  Orientation:  Full (Time, Place, and Person)  Thought Content:  WDL  Suicidal Thoughts:  Yes.  without intent/plan  Homicidal Thoughts:  No  Memory:  Immediate;   Fair Recent;   Fair Remote;   Fair  Judgement:  Fair  Insight:  Fair  Psychomotor Activity:  Decreased  Concentration:  Concentration: Fair and Attention Span: Fair  Recall:  [employer] of Knowledge:  Fair  Language:  Fair  Akathisia:  No  Handed:  Right  AIMS (if indicated):     Assets:  Communication Skills Desire for Improvement Housing Physical Health Resilience Social Support  ADL's:  Intact  Cognition:  WNL  Sleep:  Number of Hours: 7.45     Treatment Plan Summary: Daily contact with patient to assess and evaluate symptoms and progress in treatment and Medication management   Victoria Galvan is a 46 year old female with a history of recurrent depression admitted for suicidal ideation in the context of treatment noncompliance.  She has had episodes of loose stool on 03/15/17, accompanied by cramping abdominal pain.  The hospitalist team was consulted and they performed stool PCR which did not reveal anything remarkable.  Patient's instances of diarrhea have decreased in frequency and severity over the course of today.  Her appetite continues to be okay and she has been rehydrating herself.  Due to the absence of other symptoms suggestive of opiate withdrawal, it is unlikely that the diarrhea is due to opiate withdrawal.  And the opiate withdrawal can only be explained by discrepancies in dosing of her pain medications between here and prior to admission.  However this does not seem to be a strong possibility.  #Suicidal ideation, still suicidal -patient is able to contract for safety in the  hospital  #Mood, still depressed -Defer starting a new antidepressant.  Hold Effexor. -increase Seroquel to 200 mg nightly  Continue Trileptal 300 mg BID  #Nausea -Zofran 4 mg every 8 hours as needed  #Hypothyroidism -continue Synthroid 100 mcg daily  #Chronic pain -continue Percocet 10 mg every 4-6 hours PRN -continue neurontin 300 mg TID  #Diarrhea -White blood cell count has returned to normal today (8.2). -Has decreased in frequency and severity.  Effexor held for now due to the possibility that starting Effexor was responsible for the diarrhea.  Continue rehydration strategies.  #Disposition -discharge with family -follow up with [facility]  Ramond Dial, MD 03/16/2017, 2:13 PM

## 2017-03-16 NOTE — [facility] Group Notes (Signed)
[facility] LCSW Group Therapy Note  Date/Time: 03/16/17, 0930  Type of Therapy and Topic:  Group Therapy:  Feelings around Relapse and Recovery  Participation Level:  Did Not Attend   Mood:  Description of Group:    Patients in this group will discuss emotions they experience before and after a relapse. They will process how experiencing these feelings, or avoidance of experiencing them, relates to having a relapse. Facilitator will guide patients to explore emotions they have related to recovery. Patients will be encouraged to process which emotions are more powerful. They will be guided to discuss the emotional reaction significant others in their lives may have to patients' relapse or recovery. Patients will be assisted in exploring ways to respond to the emotions of others without this contributing to a relapse.  Therapeutic Goals: 1. Patient will identify two or more emotions that lead to relapse for them:  2. Patient will identify two emotions that result when they relapse:  3. Patient will identify two emotions related to recovery:  4. Patient will demonstrate ability to communicate their needs through discussion and/or role plays.   Summary of Patient Progress:     Therapeutic Modalities:   Cognitive Behavioral Therapy Solution-Focused Therapy Assertiveness Training Relapse Prevention Therapy  Greg Mykelle Cockerell, LCSW       

## 2017-03-16 NOTE — [facility] Group Notes (Signed)
[facility] Group Notes:  (Nursing/MHT/Case Management/Adjunct)  Date:  03/16/2017  Time:  6:47 PM  Type of Therapy:  Psychoeducational Skills  Participation Level:  Active  Participation Quality:  Appropriate, Attentive and Sharing  Affect:  Appropriate  Cognitive:  Alert and Appropriate  Insight:  Appropriate and Good  Engagement in Group:  Engaged  Modes of Intervention:  Discussion and Education  Summary of Progress/Problems:  Judene CompanionMary  Hajer Dwyer 03/16/2017, 6:47 PM

## 2017-03-16 NOTE — Plan of Care (Signed)
Patient slept for Estimated Hours of 7.45; Precautionary checks every 15 minutes for safety maintained, room free of safety hazards, patient sustains no injury or falls during this shift.  

## 2017-03-16 NOTE — [facility] Group Notes (Signed)
[facility] Group Notes:  (Nursing/MHT/Case Management/Adjunct)  Date:  03/16/2017  Time:  9:39 PM  Type of Therapy:  Group Therapy  Participation Level:  Active  Participation Quality:  Appropriate  Affect:  Appropriate  Cognitive:  Alert  Insight:  Good  Engagement in Group:  Engaged  Modes of Intervention:  Support  Summary of Progress/Problems:  Victoria Galvan 03/16/2017, 9:39 PM

## 2017-03-16 NOTE — Progress Notes (Signed)
Patient is alert and oriented x 4, no distress noted, denies SI/HI/AVH, mood is pleasant and cooperative with treatment plan. Patient's thoughts are organized, she appears less anxious. Lab results for c-diff negative, patient taken off of enteric precaution per MD. Up in milieu interacting appropriately with peers. Will inform MD if any acute changes occur.

## 2017-03-17 MED ORDER — QUETIAPINE FUMARATE 200 MG PO TABS
400.0000 mg | ORAL_TABLET | Freq: Every day | ORAL | Status: DC
  Administered 2017-03-18 – 2017-03-20 (×4): 400 mg via ORAL
  Filled 2017-03-17 (×4): qty 2

## 2017-03-17 MED ORDER — VENLAFAXINE HCL ER 75 MG PO CP24
75.0000 mg | ORAL_CAPSULE | Freq: Every day | ORAL | Status: DC
  Administered 2017-03-17: 75 mg via ORAL
  Filled 2017-03-17: qty 1

## 2017-03-17 NOTE — Progress Notes (Signed)
D: Patient is alert and oriented x 4, denies SI/HI/AVH, mood cooperative with treatment plan. Patient's thoughts are organized, she appears less anxious, she complained intermittently of back pain and was medicated with PRN Oxycodone as needed, patient is interacting with pees and staff appropriately.  A: Pt was offered support and encouragement, given scheduled medications, and   encouraged to attend groups. 15 minute checks were done for safety.  R:Pt did attend group and interacted well with peers and staff. Patient is compliant with medication. 15 minutes safety rounds maintained on unit.

## 2017-03-17 NOTE — [facility] Group Notes (Signed)
03/17/2017 1:15pm  Type of Therapy and Topic: Group Therapy: Feelings Around Returning Home & Establishing a Supportive Framework and Supporting Oneself When Supports Not Available  Participation Level: Did Not Attend    Victoria Nolt  Galvan, LCSWA 03/17/2017 12:53 PM 

## 2017-03-17 NOTE — Progress Notes (Signed)
[facility] [place] [facility]   PATIENT NAME: Victoria Galvan    MR#:  [id number]  DATE OF BIRTH:  30-Jun-1970  SUBJECTIVE:  CHIEF COMPLAINT: Patient resting comfortably.  No bowel movements today  No other complaints  REVIEW OF SYSTEMS:  CONSTITUTIONAL: No fever, fatigue or weakness.  EYES: No blurred or double vision.  EARS, NOSE, AND THROAT: No tinnitus or ear pain.  RESPIRATORY: No cough, shortness of breath, wheezing or hemoptysis.  CARDIOVASCULAR: No chest pain, orthopnea, edema.  GASTROINTESTINAL: No nausea, vomiting, diarrhea or abdominal pain.  GENITOURINARY: No dysuria, hematuria.  ENDOCRINE: No polyuria, nocturia,  HEMATOLOGY: No anemia, easy bruising or bleeding SKIN: No rash or lesion. MUSCULOSKELETAL: No joint pain or arthritis.   NEUROLOGIC: No tingling, numbness, weakness.  PSYCHIATRY: No anxiety or depression.   DRUG ALLERGIES:   Allergies  Allergen Reactions  . Aspirin Other (See Comments)    Unknown rx; this allergy was simply noted in former PCP's records    VITALS:  Blood pressure 110/77, pulse 66, temperature 97.8 F (36.6 C), temperature source Oral, resp. rate 18, height 5\' 1"  (1.549 m), weight 61.7 kg (136 lb), SpO2 100 %.  PHYSICAL EXAMINATION:  GENERAL:  46 y.o.-year-old patient lying in the bed with no acute distress.  EYES: Pupils equal, round, reactive to light and accommodation. No scleral icterus. Extraocular muscles intact.  HEENT: Head atraumatic, normocephalic. Oropharynx and nasopharynx clear.  NECK:  Supple, no jugular venous distention. No thyroid enlargement, no tenderness.  LUNGS: Normal breath sounds bilaterally, no wheezing, rales,rhonchi or crepitation. No use of accessory muscles of respiration.  CARDIOVASCULAR: S1, S2 normal. No murmurs, rubs, or gallops.  ABDOMEN: Soft, nontender, nondistended. Bowel sounds present. No organomegaly or mass.  EXTREMITIES: No pedal edema, cyanosis, or clubbing.   NEUROLOGIC: Cranial nerves II through XII are intact. Muscle strength 5/5 in all extremities. Sensation intact. Gait not checked.  PSYCHIATRIC: The patient is alert and oriented x 3.  SKIN: No obvious rash, lesion, or ulcer.    LABORATORY PANEL:   CBC Recent Labs  Lab 03/16/17 0712  WBC 8.2  HGB 13.3  HCT 39.4  PLT 256   ------------------------------------------------------------------------------------------------------------------  Chemistries  Recent Labs  Lab 03/16/17 0712  NA 137  K 4.6  CL 102  CO2 27  GLUCOSE 94  BUN 13  CREATININE 0.82  CALCIUM 9.3  AST 23  ALT 17  ALKPHOS 119  BILITOT 0.5   ------------------------------------------------------------------------------------------------------------------  Cardiac Enzymes No results for input(s): TROPONINI in the last 168 hours. ------------------------------------------------------------------------------------------------------------------  RADIOLOGY:  No results found.  EKG:   Orders placed or performed in visit on 11/20/14  . EKG 12-Lead    ASSESSMENT AND PLAN:   46 year old female seen for acute onset diarrhea  * Acute onset Diarrhea Resolved -- Neg GI Panel, C.diff, nml TSH -   - start Imodium for symptomatic mgmt -Discontinued milk of magnesia -Started on probiotics and yogurt -Strict intake and output to be monitored discussed with RN  * Leukocytosis - Unsure etio, Monitor - UA & CXR neg  * Hypothyroidism - continue synthroid, normal TSH  * Depression - Continue Effexor, Seroquel and Trileptal per Psych   Will sign off    All the records are reviewed and case discussed with Care Management/Social Workerr. Management plans discussed with the patient, RN  and they are in agreement.    TOTAL TIME TAKING CARE OF THIS PATIENT: 32 minutes.    Note: This dictation was prepared with  Dragon dictation along with smaller Lobbyistphrase technology. Any transcriptional errors that  result from this process are unintentional.   Ramonita LabGouru, Dajai Wahlert M.D 03/16/2017 Between 7am to 6pm - Pager - [phone] After 6pm go to [email or web address] - password [email or web address] [facility]  [facility]  Office  [phone]  CC: Primary care physician; Herma CarsonMandry, Heidi, MD

## 2017-03-17 NOTE — Plan of Care (Signed)
  Spiritual Needs Ability to function at adequate level 03/17/2017 0644 - Progressing by Trula OreAjetunmobi, Aivah Putman Abisola, RN   Education: Verbalization of understanding the information provided will improve 03/17/2017 0644 - Progressing by Trula OreAjetunmobi, Dollene Mallery Abisola, RN

## 2017-03-17 NOTE — Plan of Care (Signed)
Patient alert and oriented x 4. Patient denies SI, HI and AVH. Patient is compliant with medications most of the time, but does decline gabapentin at times. Patient educated on medications. Patient interacts with peers on the unit. Patient had a disagreement about the remote control earlier and became upset. Patient went to her room to calm down, patient was tearful about the situation. Patient does laundry and communicates feelings to staff. 15 min safety checks will continue and nurse will continue to encourage group meeting attendance.

## 2017-03-17 NOTE — Progress Notes (Signed)
[facility] [place] at [facility]   PATIENT NAME: Victoria Galvan    MR#:  [id number]  DATE OF BIRTH:  08-18-1970  SUBJECTIVE:  CHIEF COMPLAINT: Patient reporting 15 bowel movements but RN witnessed none  patient was watching TV  REVIEW OF SYSTEMS:  CONSTITUTIONAL: No fever, fatigue or weakness.  EYES: No blurred or double vision.  EARS, NOSE, AND THROAT: No tinnitus or ear pain.  RESPIRATORY: No cough, shortness of breath, wheezing or hemoptysis.  CARDIOVASCULAR: No chest pain, orthopnea, edema.  GASTROINTESTINAL: No nausea, vomiting, diarrhea or abdominal pain.  GENITOURINARY: No dysuria, hematuria.  ENDOCRINE: No polyuria, nocturia,  HEMATOLOGY: No anemia, easy bruising or bleeding SKIN: No rash or lesion. MUSCULOSKELETAL: No joint pain or arthritis.   NEUROLOGIC: No tingling, numbness, weakness.  PSYCHIATRY: No anxiety or depression.   DRUG ALLERGIES:   Allergies  Allergen Reactions  . Aspirin Other (See Comments)    Unknown rx; this allergy was simply noted in former PCP's records    VITALS:  Blood pressure 110/77, pulse 66, temperature 97.8 F (36.6 C), temperature source Oral, resp. rate 18, height 5\' 1"  (1.549 m), weight 61.7 kg (136 lb), SpO2 100 %.  PHYSICAL EXAMINATION:  GENERAL:  46 y.o.-year-old patient lying in the bed with no acute distress.  EYES: Pupils equal, round, reactive to light and accommodation. No scleral icterus. Extraocular muscles intact.  HEENT: Head atraumatic, normocephalic. Oropharynx and nasopharynx clear.  NECK:  Supple, no jugular venous distention. No thyroid enlargement, no tenderness.  LUNGS: Normal breath sounds bilaterally, no wheezing, rales,rhonchi or crepitation. No use of accessory muscles of respiration.  CARDIOVASCULAR: S1, S2 normal. No murmurs, rubs, or gallops.  ABDOMEN: Soft, nontender, nondistended. Bowel sounds present. No organomegaly or mass.  EXTREMITIES: No pedal edema, cyanosis, or clubbing.   NEUROLOGIC: Cranial nerves II through XII are intact. Muscle strength 5/5 in all extremities. Sensation intact. Gait not checked.  PSYCHIATRIC: The patient is alert and oriented x 3.  SKIN: No obvious rash, lesion, or ulcer.    LABORATORY PANEL:   CBC Recent Labs  Lab 03/16/17 0712  WBC 8.2  HGB 13.3  HCT 39.4  PLT 256   ------------------------------------------------------------------------------------------------------------------  Chemistries  Recent Labs  Lab 03/16/17 0712  NA 137  K 4.6  CL 102  CO2 27  GLUCOSE 94  BUN 13  CREATININE 0.82  CALCIUM 9.3  AST 23  ALT 17  ALKPHOS 119  BILITOT 0.5   ------------------------------------------------------------------------------------------------------------------  Cardiac Enzymes No results for input(s): TROPONINI in the last 168 hours. ------------------------------------------------------------------------------------------------------------------  RADIOLOGY:  No results found.  EKG:   Orders placed or performed in visit on 11/20/14  . EKG 12-Lead    ASSESSMENT AND PLAN:   [age] year old female seen for acute onset diarrhea  * Acute onset Diarrhea - Neg GI Panel, C.diff, nml TSH -   - start Imodium for symptomatic mgmt -Discontinued milk of magnesia -Started on probiotics and yogurt -Strict intake and output to be monitored discussed with RN  * Leukocytosis - Unsure etio, Monitor - UA & CXR neg  * Hypothyroidism - continue synthroid, normal TSH  * Depression - Continue Effexor, Seroquel and Trileptal per Psych       All the records are reviewed and case discussed with Care Management/Social Workerr. Management plans discussed with the patient, RN  and they are in agreement.    TOTAL TIME TAKING CARE OF THIS PATIENT: 32 minutes.    Note: This dictation was prepared with Reubin Milanragon  dictation along with smaller phrase technology. Any transcriptional errors that result from this process  are unintentional.   Ramonita LabGouru, Denee Boeder M.D 03/16/2017 Between 7am to 6pm - Pager - [phone] After 6pm go to [email or web address] - password [email or web address] [facility]  [facility]  Office  [phone]  CC: Primary care physician; Herma CarsonMandry, Heidi, MD

## 2017-03-17 NOTE — [facility] Group Notes (Signed)
[facility] Group Notes:  (Nursing/MHT/Case Management/Adjunct)  Date:  03/17/2017  Time:  10:30 PM  Type of Therapy:  Psychoeducational Skills  Participation Level:  Active  Participation Quality:  Appropriate  Affect:  Appropriate  Cognitive:  Appropriate  Insight:  Good  Engagement in Group:  Engaged  Modes of Intervention:  Discussion, Socialization and Support  Summary of Progress/Problems:  Wilson SingerJustin  Stanton Kissoon 03/17/2017, 10:30 PM

## 2017-03-17 NOTE — Progress Notes (Signed)
[facility] MD Progress Note  03/17/2017 4:49 PM Victoria Galvan  MRN:  [id number]  Subjective:   Victoria Galvan is a 46 year old female with a history of depression admitted for suicidal ideation. Patient has not had any more episodes of loose stool since yesterday.  Describes to me that her mood is up and down most of the time.  Is not able to sleep well at night.  Endorses feeling anxious at times.  Denies any suicidal ideation or thoughts about dying.  Treatment plan. We increased Seroquel to 400 mg nightly and Trileptal 300 mg BID for mood stabilization .  We will continue to hold off Effexor for now. Social/disposition. She will return home, follow up with [facility].  Principal Problem: Major depressive disorder, recurrent, severe with psychotic features ([place]) Diagnosis:   Patient Active Problem List   Diagnosis Date Noted  . Major depressive disorder, recurrent, severe with psychotic features ([place]) [F33.3] 03/12/2017  . Suicidal ideations [R45.851]   . MDD (major depressive disorder), recurrent episode, severe ([place]) [F33.2] 05/03/2016  . GAD (generalized anxiety disorder) [F41.1] 05/13/2015  . Myalgia [M79.10] 08/07/2014  . Positive ANA (antinuclear antibody) [R76.8] 08/07/2014  . Hypothyroidism [E03.9] 08/07/2014   Total Time spent with patient: 30 minutes  Past Psychiatric History: depression and anxiety.  Past Medical History:  Past Medical History:  Diagnosis Date  . Anxiety disorder    with panic  . Cervical spinal stenosis    spondylosis  . Chronic nausea    likely from chronic narcotic pain med use  . Chronic neck pain   . Colon polyps    Pt states "colon polyp was removed when they did my hysterectomy"--need old records to veriffy.  . Connective tissue disease ([place])    Dr. Philis Pique.  Started plaquenil 09/2014.  Marland Kitchen Depression   . Fibromyalgia    per pt report  . History of cervical cancer   . History of frequent urinary tract infections   . Hypothyroidism    med  noncompliance  . Positive ANA (antinuclear antibody)   . Tobacco dependence     Past Surgical History:  Procedure Laterality Date  . Adhesions removed  2008  . ESOPHAGOGASTRODUODENOSCOPY  2005   mild gastritis; h pylori neg.  Says she has had esoph dilation several times  . holter monitor  2004   Normal  . MRI brain  2002;2004   Normal  . OOPHORECTOMY  2007   right-path neg  . OOPHORECTOMY  2012   left  . TONSILLECTOMY  1978  . TRANSTHORACIC ECHOCARDIOGRAM  2004   Normal  . VAGINAL HYSTERECTOMY  1999   for cervical cancer per pt   Family History:  Family History  Problem Relation Age of Onset  . Arthritis Mother   . Cancer Mother        Lung  . Arthritis Father    Family Psychiatric  History: depression and anxiety. Social History:  Social History   Substance and Sexual Activity  Alcohol Use No  . Alcohol/week: 0.0 oz  . Frequency: Never   Comment: occasionally     Social History   Substance and Sexual Activity  Drug Use No    Social History   Socioeconomic History  . Marital status: Married    Spouse name: None  . Number of children: 2  . Years of education: College  . Highest education level: None  Social Needs  . Financial resource strain: None  . Food insecurity - worry: None  . Food  insecurity - inability: None  . Transportation needs - medical: None  . Transportation needs - non-medical: None  Occupational History  . Occupation: Forensic psychologist: UNEMPLOYED  Tobacco Use  . Smoking status: Current Every Day Smoker    Packs/day: 0.50    Types: Cigarettes  . Smokeless tobacco: Never Used  Substance and Sexual Activity  . Alcohol use: No    Alcohol/week: 0.0 oz    Frequency: Never    Comment: occasionally  . Drug use: No  . Sexual activity: Yes    Birth control/protection: Surgical  Other Topics Concern  . None  Social History Narrative   Married.   Education: GED, Psychologist, occupational.   Occupation: unemployed, formerly worked as a Quarry manager.    Additional Social History:                         Sleep: Fair  Appetite:  Fair  Current Medications: Current Facility-Administered Medications  Medication Dose Route Frequency Provider Last Rate Last Dose  . acetaminophen (TYLENOL) tablet 650 mg  650 mg Oral Q6H PRN Pucilowska, Jolanta B, MD   650 mg at 03/13/17 0949  . acidophilus (RISAQUAD) capsule 2 capsule  2 capsule Oral TID Nicholes Mango, MD   2 capsule at 03/17/17 1240  . gabapentin (NEURONTIN) capsule 300 mg  300 mg Oral TID Pucilowska, Jolanta B, MD   300 mg at 03/17/17 1240  . levothyroxine (SYNTHROID, LEVOTHROID) tablet 100 mcg  100 mcg Oral QAC breakfast Pucilowska, Jolanta B, MD   100 mcg at 03/17/17 0853  . loperamide (IMODIUM) capsule 2 mg  2 mg Oral Q4H PRN Ramond Dial, MD   2 mg at 03/16/17 0631  . ondansetron (ZOFRAN) tablet 4 mg  4 mg Oral Q8H PRN Ramond Dial, MD      . Oxcarbazepine (TRILEPTAL) tablet 300 mg  300 mg Oral BID Pucilowska, Jolanta B, MD   300 mg at 03/16/17 1656  . oxyCODONE-acetaminophen (PERCOCET/ROXICET) 5-325 MG per tablet 2 tablet  2 tablet Oral Q4H PRN Pucilowska, Jolanta B, MD   2 tablet at 03/17/17 1444  . QUEtiapine (SEROQUEL) tablet 400 mg  400 mg Oral QHS Ramond Dial, MD       Facility-Administered Medications Ordered in Other Encounters  Medication Dose Route Frequency Provider Last Rate Last Dose  . gabapentin (NEURONTIN) tablet 300 mg  300 mg Oral TID Pucilowska, Jolanta B, MD      . hydrOXYzine (ATARAX/VISTARIL) tablet 50 mg  50 mg Oral TID PRN Pucilowska, Jolanta B, MD      . nicotine (NICODERM CQ - dosed in mg/24 hours) patch 21 mg  21 mg Transdermal Q0600 Pucilowska, Jolanta B, MD        Lab Results:  Results for orders placed or performed during the hospital encounter of 03/12/17 (from the past 48 hour(s))  CBC     Status: None   Collection Time: 03/16/17  7:12 AM  Result Value Ref Range   WBC 8.2 3.6 - 11.0 K/uL   RBC 4.35 3.80 - 5.20 MIL/uL    Hemoglobin 13.3 12.0 - 16.0 g/dL   HCT 39.4 35.0 - 47.0 %   MCV 90.4 80.0 - 100.0 fL   MCH 30.6 26.0 - 34.0 pg   MCHC 33.9 32.0 - 36.0 g/dL   RDW 13.6 11.5 - 14.5 %   Platelets 256 150 - 440 K/uL  Comprehensive metabolic panel     Status: None  Collection Time: 03/16/17  7:12 AM  Result Value Ref Range   Sodium 137 135 - 145 mmol/L   Potassium 4.6 3.5 - 5.1 mmol/L   Chloride 102 101 - 111 mmol/L   CO2 27 22 - 32 mmol/L   Glucose, Bld 94 65 - 99 mg/dL   BUN 13 6 - 20 mg/dL   Creatinine, Ser 0.82 0.44 - 1.00 mg/dL   Calcium 9.3 8.9 - 10.3 mg/dL   Total Protein 7.5 6.5 - 8.1 g/dL   Albumin 4.2 3.5 - 5.0 g/dL   AST 23 15 - 41 U/L   ALT 17 14 - 54 U/L   Alkaline Phosphatase 119 38 - 126 U/L   Total Bilirubin 0.5 0.3 - 1.2 mg/dL   GFR calc non Af Amer >60 >60 mL/min   GFR calc Af Amer >60 >60 mL/min    Comment: (NOTE) The eGFR has been calculated using the CKD EPI equation. This calculation has not been validated in all clinical situations. eGFR's persistently <60 mL/min signify possible Chronic Kidney Disease.    Anion gap 8 5 - 15    Blood Alcohol level:  Lab Results  Component Value Date   ETH <10 03/12/2017   ETH 264 (H) 97/94/8016    Metabolic Disorder Labs: Lab Results  Component Value Date   HGBA1C 5.2 05/05/2016   MPG 103 05/05/2016   No results found for: PROLACTIN Lab Results  Component Value Date   CHOL 198 05/05/2016   TRIG 92 05/05/2016   HDL 51 05/05/2016   CHOLHDL 3.9 05/05/2016   VLDL 18 05/05/2016   LDLCALC 129 (H) 05/05/2016    Physical Findings: AIMS:  , ,  ,  ,    CIWA:    COWS:     Musculoskeletal: Strength & Muscle Tone: within normal limits Gait & Station: normal Patient leans: N/A  Psychiatric Specialty Exam: Physical Exam  Nursing note reviewed. Psychiatric: Her speech is normal and behavior is normal. Her mood appears anxious. Cognition and memory are normal. She exhibits a depressed mood.    Review of Systems   Psychiatric/Behavioral: Positive for depression. The patient is nervous/anxious.   All other systems reviewed and are negative.   Blood pressure 110/77, pulse 66, temperature 97.8 F (36.6 C), temperature source Oral, resp. rate 18, height '5\' 1"'  (1.549 m), weight 136 lb (61.7 kg), SpO2 100 %.Body mass index is 25.7 kg/m.  General Appearance: Casual  Eye Contact:  Good  Speech:  Clear and Coherent  Volume:  Normal  Mood:  Anxious and Depressed  Affect:  Congruent  Thought Process:  Goal Directed and Descriptions of Associations: Intact  Orientation:  Full (Time, Place, and Person)  Thought Content:  WDL  Suicidal Thoughts:  Yes.  without intent/plan  Homicidal Thoughts:  No  Memory:  Immediate;   Fair Recent;   Fair Remote;   Fair  Judgement:  Fair  Insight:  Fair  Psychomotor Activity:  Decreased  Concentration:  Concentration: Fair and Attention Span: Fair  Recall:  [employer] of Knowledge:  Fair  Language:  Fair  Akathisia:  No  Handed:  Right  AIMS (if indicated):     Assets:  Communication Skills Desire for Improvement Housing Physical Health Resilience Social Support  ADL's:  Intact  Cognition:  WNL  Sleep:  Number of Hours: 6.3     Treatment Plan Summary: Daily contact with patient to assess and evaluate symptoms and progress in treatment and Medication management  Victoria Galvan is a 45 year old female with a history of recurrent depression admitted for suicidal ideation in the context of treatment noncompliance.  She has had episodes of loose stool on 03/15/17, accompanied by cramping abdominal pain.  The hospitalist team was consulted and they performed stool PCR which did not reveal anything remarkable.  Patient's instances of diarrhea have decreased in frequency and severity over the course of today.  Her appetite continues to be okay and she has been rehydrating herself.  Galvan to the absence of other symptoms suggestive of opiate withdrawal, it is unlikely that  the diarrhea is Galvan to opiate withdrawal.  And the opiate withdrawal can only be explained by discrepancies in dosing of her pain medications between here and prior to admission.  However this does not seem to be a strong possibility.  #Suicidal ideation, still suicidal -patient is able to contract for safety in the hospital  #Mood, still depressed -increase Seroquel to 400 mg nightly  Continue Trileptal 300 mg BID  #Nausea -Zofran 4 mg every 8 hours as needed  #Hypothyroidism -continue Synthroid 100 mcg daily  #Chronic pain -continue Percocet 10 mg every 4-6 hours PRN -continue neurontin 300 mg TID  #Diarrhea -White blood cell count has returned to normal today (8.2). -Has decreased in frequency and severity.  Effexor held for now Galvan to the possibility that starting Effexor was responsible for the diarrhea.  Continue rehydration strategies.  #Disposition -discharge with family -follow up with [facility]  Ramond Dial, MD 03/17/2017, 4:49 PM

## 2017-03-17 NOTE — Plan of Care (Signed)
Pt safe on the unit at this time, pt stated she felt the same

## 2017-03-17 NOTE — Progress Notes (Signed)
D: Pt  Passive SI- contracts for safety denies HI/AVH. Pt is pleasant and cooperative. Pt stated she felt the same but felt hopeful about the future, pt stated she had a lot of pain and a lot of her issues stem around the pain.   A: Pt was offered support and encouragement. Pt was given scheduled medications. Pt was encourage to attend groups. Q 15 minute checks were done for safety.   R:Pt attends groups and interacts well with peers and staff. Pt is taking medication. Pt receptive to treatment and safety maintained on unit.

## 2017-03-18 MED ORDER — OXCARBAZEPINE 300 MG PO TABS: 400.0000 mg | ORAL_TABLET | Freq: Two times a day (BID) | ORAL | Status: DC

## 2017-03-18 MED ORDER — HYDROCERIN EX CREA
TOPICAL_CREAM | Freq: Two times a day (BID) | CUTANEOUS | Status: DC
  Administered 2017-03-18 – 2017-03-21 (×3): via TOPICAL
  Filled 2017-03-18: qty 113

## 2017-03-18 NOTE — Progress Notes (Signed)
Patient ID: Victoria Galvan, female   DOB: 1971/02/29, 46 y.o.   MRN: [id number]     D: Pt has been very anxious today on the unit, she reported that she was just worried because she was starting to get a rash on her body. When this writer spoke to patient she reported that he rash was on her hands, not her entire body. Pt reported that he rash started about 4 days ago which is when she started the Trileptal. Dr Kathy Breachakesh was made aware of rash, orders not to hold Trileptal. Pt took all other medications without any problems, no other issues or concerns noted. Pt reported being negative SI/HI, no AH/VH noted. A: 15 min checks continued for patient safety. R: Pt safety maintained.

## 2017-03-18 NOTE — [facility] Group Notes (Signed)
03/18/2017 1:15pm  Type of Therapy and Topic: Group Therapy: Holding on to Grudges   Participation Level: Minimal   Description of Group:  In this group patients will be asked to explore and define a grudge. Patients will be guided to discuss their thoughts, feelings, and reasons as to why people have grudges. Patients will process the impact grudges have on daily life and identify thoughts and feelings related to holding grudges. Facilitator will challenge patients to identify ways to let go of grudges and the benefits this provides. Patients will be confronted to address why one struggles letting go of grudges. Lastly, patients will identify feelings and thoughts related to what life would look like without grudges. This group will be process-oriented, with patients participating in exploration of their own experiences, giving and receiving support, and processing challenge from other group members.  Therapeutic Goals:  1. Patient will identify specific grudges related to their personal life.  2. Patient will identify feelings, thoughts, and beliefs around grudges.  3. Patient will identify how one releases grudges appropriately.  4. Patient will identify situations where they could have let go of the grudge, but instead chose to hold on.   Summary of Patient Progress: Pt identified specific grudges that are affecting her life.     Therapeutic Modalities:  Cognitive Behavioral Therapy  Solution Focused Therapy  Motivational Interviewing  Brief Therapy   Adaleah Forget  CUEBAS-COLON, LCSWA 03/18/2017 11:47 AM

## 2017-03-18 NOTE — Progress Notes (Addendum)
[facility] MD Progress Note  03/18/2017 12:04 PM Victoria Galvan  MRN:  [id number]  Subjective:   Victoria Galvan is a 46 year old female with a history of depression admitted for suicidal ideation. 03/17/17-Victoria Galvan has not had any more episodes of loose stool since yesterday.  Describes to me that her mood is up and down most of the time.  Is not able to sleep well at night.  Endorses feeling anxious at times.  Denies any suicidal ideation or thoughts about dying.  03/18/17-says she was able to sleep well with Seroquel 400 mg.  Endorses that she likes how the medication makes her feel and would like to continue the same.  Feels that her mood is getting better but is still not back to baseline.  Denies any suicidal thoughts or thoughts about dying.  Treatment plan. We increased Seroquel to 400 mg nightly and Trileptal to 400 mg BID for mood stabilization . D/C'ed effexor. Social/disposition. She will return home, follow up with [facility].  Principal Problem: Major depressive disorder, recurrent, severe with psychotic features (HCC) Diagnosis:   Victoria Galvan Active Problem List   Diagnosis Date Noted  . Major depressive disorder, recurrent, severe with psychotic features (HCC) [F33.3] 03/12/2017  . Suicidal ideations [R45.851]   . MDD (major depressive disorder), recurrent episode, severe (HCC) [F33.2] 05/03/2016  . GAD (generalized anxiety disorder) [F41.1] 05/13/2015  . Myalgia [M79.10] 08/07/2014  . Positive ANA (antinuclear antibody) [R76.8] 08/07/2014  . Hypothyroidism [E03.9] 08/07/2014   Total Time spent with Victoria Galvan: 30 minutes  Past Psychiatric History: depression and anxiety.  Past Medical History:  Past Medical History:  Diagnosis Date  . Anxiety disorder    with panic  . Cervical spinal stenosis    spondylosis  . Chronic nausea    likely from chronic narcotic pain med use  . Chronic neck pain   . Colon polyps    Pt states "colon polyp was removed when they did my hysterectomy"--need old  records to veriffy.  . Connective tissue disease (HCC)    Dr. Malena Catholic.  Started plaquenil 09/2014.  Marland Kitchen Depression   . Fibromyalgia    per pt report  . History of cervical cancer   . History of frequent urinary tract infections   . Hypothyroidism    med noncompliance  . Positive ANA (antinuclear antibody)   . Tobacco dependence     Past Surgical History:  Procedure Laterality Date  . Adhesions removed  2008  . ESOPHAGOGASTRODUODENOSCOPY  2005   mild gastritis; h pylori neg.  Says she has had esoph dilation several times  . holter monitor  2004   Normal  . MRI brain  2002;2004   Normal  . OOPHORECTOMY  2007   right-path neg  . OOPHORECTOMY  2012   left  . TONSILLECTOMY  1978  . TRANSTHORACIC ECHOCARDIOGRAM  2004   Normal  . VAGINAL HYSTERECTOMY  1999   for cervical cancer per pt   Family History:  Family History  Problem Relation Age of Onset  . Arthritis Mother   . Cancer Mother        Lung  . Arthritis Father    Family Psychiatric  History: depression and anxiety. Social History:  Social History   Substance and Sexual Activity  Alcohol Use No  . Alcohol/week: 0.0 oz  . Frequency: Never   Comment: occasionally     Social History   Substance and Sexual Activity  Drug Use No    Social History   Socioeconomic History  .  Marital status: Married    Spouse name: None  . Number of children: 2  . Years of education: College  . Highest education level: None  Social Needs  . Financial resource strain: None  . Food insecurity - worry: None  . Food insecurity - inability: None  . Transportation needs - medical: None  . Transportation needs - non-medical: None  Occupational History  . Occupation: Scientist, research (medical)CNA    Employer: UNEMPLOYED  Tobacco Use  . Smoking status: Current Every Day Smoker    Packs/day: 0.50    Types: Cigarettes  . Smokeless tobacco: Never Used  Substance and Sexual Activity  . Alcohol use: No    Alcohol/week: 0.0 oz    Frequency: Never     Comment: occasionally  . Drug use: No  . Sexual activity: Yes    Birth control/protection: Surgical  Other Topics Concern  . None  Social History Narrative   Married.   Education: GED, Copywriter, advertisingCNA training.   Occupation: unemployed, formerly worked as a LawyerCNA.   Additional Social History:                         Sleep: Fair  Appetite:  Fair  Current Medications: Current Facility-Administered Medications  Medication Dose Route Frequency Provider Last Rate Last Dose  . acetaminophen (TYLENOL) tablet 650 mg  650 mg Oral Q6H PRN Pucilowska, Jolanta B, MD   650 mg at 03/13/17 0949  . acidophilus (RISAQUAD) capsule 2 capsule  2 capsule Oral TID Ramonita LabGouru, Aruna, MD   2 capsule at 03/18/17 0906  . gabapentin (NEURONTIN) capsule 300 mg  300 mg Oral TID Pucilowska, Jolanta B, MD   300 mg at 03/18/17 0906  . levothyroxine (SYNTHROID, LEVOTHROID) tablet 100 mcg  100 mcg Oral QAC breakfast Pucilowska, Jolanta B, MD   100 mcg at 03/18/17 0906  . loperamide (IMODIUM) capsule 2 mg  2 mg Oral Q4H PRN Aundria Rudakesh, Javohn Basey, MD   2 mg at 03/16/17 0631  . ondansetron (ZOFRAN) tablet 4 mg  4 mg Oral Q8H PRN Aundria Rudakesh, Daylen Lipsky, MD      . Oxcarbazepine (TRILEPTAL) tablet 300 mg  300 mg Oral BID Pucilowska, Jolanta B, MD   300 mg at 03/18/17 0905  . oxyCODONE-acetaminophen (PERCOCET/ROXICET) 5-325 MG per tablet 2 tablet  2 tablet Oral Q4H PRN Pucilowska, Jolanta B, MD   2 tablet at 03/18/17 1107  . QUEtiapine (SEROQUEL) tablet 400 mg  400 mg Oral QHS Aundria Rudakesh, Karly Pitter, MD   400 mg at 03/18/17 0006   Facility-Administered Medications Ordered in Other Encounters  Medication Dose Route Frequency Provider Last Rate Last Dose  . gabapentin (NEURONTIN) tablet 300 mg  300 mg Oral TID Pucilowska, Jolanta B, MD      . hydrOXYzine (ATARAX/VISTARIL) tablet 50 mg  50 mg Oral TID PRN Pucilowska, Jolanta B, MD      . nicotine (NICODERM CQ - dosed in mg/24 hours) patch 21 mg  21 mg Transdermal Q0600 Pucilowska, Jolanta B, MD         Lab Results:  No results found for this or any previous visit (from the past 48 hour(s)).  Blood Alcohol level:  Lab Results  Component Value Date   ETH <10 03/12/2017   ETH 264 (H) 05/02/2016    Metabolic Disorder Labs: Lab Results  Component Value Date   HGBA1C 5.2 05/05/2016   MPG 103 05/05/2016   No results found for: PROLACTIN Lab Results  Component Value Date  CHOL 198 05/05/2016   TRIG 92 05/05/2016   HDL 51 05/05/2016   CHOLHDL 3.9 05/05/2016   VLDL 18 05/05/2016   LDLCALC 129 (H) 05/05/2016    Physical Findings: AIMS:  , ,  ,  ,    CIWA:    COWS:     Musculoskeletal: Strength & Muscle Tone: within normal limits Gait & Station: normal Victoria Galvan leans: N/A  Psychiatric Specialty Exam: Physical Exam  Nursing note reviewed. Psychiatric: Her speech is normal and behavior is normal. Her mood appears anxious. Cognition and memory are normal. She exhibits a depressed mood.    Review of Systems  Psychiatric/Behavioral: Positive for depression. The Victoria Galvan is nervous/anxious.   All other systems reviewed and are negative.   Blood pressure 118/77, pulse 74, temperature 97.8 F (36.6 C), temperature source Oral, resp. rate 18, height 5\' 1"  (1.549 m), weight 136 lb (61.7 kg), SpO2 100 %.Body mass index is 25.7 kg/m.  General Appearance: Casual  Eye Contact:  Good  Speech:  Clear and Coherent  Volume:  Normal  Mood:  Anxious and Depressed  Affect:  Congruent  Thought Process:  Goal Directed and Descriptions of Associations: Intact  Orientation:  Full (Time, Place, and Person)  Thought Content:  WDL  Suicidal Thoughts:  Yes.  without intent/plan  Homicidal Thoughts:  No  Memory:  Immediate;   Fair Recent;   Fair Remote;   Fair  Judgement:  Fair  Insight:  Fair  Psychomotor Activity:  Decreased  Concentration:  Concentration: Fair and Attention Span: Fair  Recall:  Fis[employer]Fair  Fund of Knowledge:  Fair  Language:  Fair  Akathisia:  No  Handed:  Right   AIMS (if indicated):     Assets:  Communication Skills Desire for Improvement Housing Physical Health Resilience Social Support  ADL's:  Intact  Cognition:  WNL  Sleep:  Number of Hours: 4.45     Treatment Plan Summary: Daily contact with Victoria Galvan to assess and evaluate symptoms and progress in treatment and Medication management   Victoria Galvan is a 46 year old female with a history of recurrent depression admitted for suicidal ideation in the context of treatment noncompliance.  She has had episodes of loose stool on 03/15/17, accompanied by cramping abdominal pain.  These episodes have decreased in severity over the last 3 days.  Victoria Galvan endorses her mood as being "up and down", but mainly depressed.  It is possible that she is had a history of hypomanic episodes.  I discontinue venlafaxine and increase the dose of Seroquel to service of mood stabilizer in addition to the Trileptal.  #Suicidal ideation, still suicidal -Victoria Galvan is able to contract for safety in the hospital  #Mood, still depressed -increase Seroquel to 400 mg nightly  Initially plan was to increase Trileptal to 400 mg twice daily.  However Victoria Galvan reports that she has had a macular rash over her fingers wrist and forearms on both sides.  Says the rash started 4 days ago coinciding with initiation of Trileptal.  We will hold Trileptal for now to see if the rash subsides.  Trileptal is known to cause rash.  #Hypothyroidism -continue Synthroid 100 mcg daily  #Chronic pain -continue Percocet 10 mg every 4-6 hours PRN -continue neurontin 300 mg TID  #Diarrhea -White blood cell count has returned to normal.  -Has not had any more episodes of loose stools since 03/16/2017.  #Disposition -discharge with family -[facility]  Aundria RudGopalkumar Montanna Mcbain, MD 03/18/2017, 12:04 PM

## 2017-03-19 DIAGNOSIS — Z5181 Encounter for therapeutic drug level monitoring: Secondary | ICD-10-CM

## 2017-03-19 MED ORDER — DULOXETINE HCL 30 MG PO CPEP
30.0000 mg | ORAL_CAPSULE | Freq: Every day | ORAL | Status: DC
  Administered 2017-03-19 – 2017-03-20 (×2): 30 mg via ORAL
  Filled 2017-03-19 (×2): qty 1

## 2017-03-19 NOTE — Progress Notes (Signed)
Recreation Therapy Notes   Date: 11.26.18    Time: 1:00pm    Location: Craft Room    Behavioral response: Appropriate    Intervention Topic: Values    Discussion/Intervention:  Group content today was focused on values. The group identified what values are and where they come from. Individuals expressed some values and how many they have. Patients described how they go about add or removing values. The group described the importance of having values and how they go about using them in daily life. Patient participated in the intervention "My Values" where they were able to pick out values that were important to them and make a visual aide.    Clinical Observations/Feedback:    Patient came to group and was focused on the topic at hand. She participated in the intervention and was social with peers and staff during group.  Fillmore Bynum LRT/CTRS          Grey Rakestraw 03/19/2017 1:53 PM

## 2017-03-19 NOTE — Tx Team (Signed)
Interdisciplinary Treatment and Diagnostic Plan Update  03/19/2017 Time of Session: 1030 Victoria Galvan MRN: [id number]  Principal Diagnosis: Major depressive disorder, recurrent, severe with psychotic features (HCC)  Secondary Diagnoses: Principal Problem:   Major depressive disorder, recurrent, severe with psychotic features (HCC) Active Problems:   Hypothyroidism   Suicidal ideations   Current Medications:  Current Facility-Administered Medications  Medication Dose Route Frequency Provider Last Rate Last Dose  . acetaminophen (TYLENOL) tablet 650 mg  650 mg Oral Q6H PRN Pucilowska, Jolanta B, MD   650 mg at 03/13/17 0949  . acidophilus (RISAQUAD) capsule 2 capsule  2 capsule Oral TID Ramonita Lab, MD   2 capsule at 03/18/17 1653  . gabapentin (NEURONTIN) capsule 300 mg  300 mg Oral TID Pucilowska, Jolanta B, MD   300 mg at 03/18/17 1653  . hydrocerin (EUCERIN) cream   Topical BID Aundria Rud, MD      . levothyroxine (SYNTHROID, LEVOTHROID) tablet 100 mcg  100 mcg Oral QAC breakfast Pucilowska, Jolanta B, MD   100 mcg at 03/18/17 0906  . loperamide (IMODIUM) capsule 2 mg  2 mg Oral Q4H PRN Aundria Rud, MD   2 mg at 03/16/17 0631  . oxyCODONE-acetaminophen (PERCOCET/ROXICET) 5-325 MG per tablet 2 tablet  2 tablet Oral Q4H PRN Pucilowska, Jolanta B, MD   2 tablet at 03/19/17 1003  . QUEtiapine (SEROQUEL) tablet 400 mg  400 mg Oral QHS Aundria Rud, MD   400 mg at 03/18/17 2146   Facility-Administered Medications Ordered in Other Encounters  Medication Dose Route Frequency Provider Last Rate Last Dose  . gabapentin (NEURONTIN) tablet 300 mg  300 mg Oral TID Pucilowska, Jolanta B, MD      . hydrOXYzine (ATARAX/VISTARIL) tablet 50 mg  50 mg Oral TID PRN Pucilowska, Jolanta B, MD      . nicotine (NICODERM CQ - dosed in mg/24 hours) patch 21 mg  21 mg Transdermal Q0600 Pucilowska, Jolanta B, MD       PTA Medications: Medications Prior to Admission  Medication Sig  Dispense Refill Last Dose  . gabapentin (NEURONTIN) 300 MG capsule Take 1 capsule (300 mg total) by mouth 4 (four) times daily - after meals and at bedtime. (Patient taking differently: Take 600 mg at bedtime by mouth. ) 120 capsule 0 03/11/2017 at Unknown time  . hydrOXYzine (ATARAX/VISTARIL) 50 MG tablet Take 1 tablet (50 mg total) by mouth every 6 (six) hours as needed for anxiety. 30 tablet 0 Past Week at Unknown time  . levothyroxine (SYNTHROID, LEVOTHROID) 100 MCG tablet Take 50 mcg daily before breakfast by mouth.   03/12/2017 at Unknown time  . oxyCODONE-acetaminophen (PERCOCET) 10-325 MG tablet Take 1 tablet every 4 (four) hours as needed by mouth for pain (RA).   03/11/2017 at Unknown time    Patient Stressors: Financial difficulties Health problems Loss of Sister and Nephew Traumatic event  Patient Strengths: Active sense of humor Average or above average intelligence Capable of independent living Communication skills Motivation for treatment/growth Supportive family/friends  Treatment Modalities: Medication Management, Group therapy, Case management,  1 to 1 session with clinician, Psychoeducation, Recreational therapy.   Physician Treatment Plan for Primary Diagnosis: Major depressive disorder, recurrent, severe with psychotic features (HCC) Long Term Goal(s): Improvement in symptoms so as ready for discharge NA   Short Term Goals: Ability to identify changes in lifestyle to reduce recurrence of condition will improve Ability to verbalize feelings will improve Ability to disclose and discuss suicidal ideas Ability to demonstrate self-control  will improve Ability to identify and develop effective coping behaviors will improve Ability to maintain clinical measurements within normal limits will improve Compliance with prescribed medications will improve Ability to identify triggers associated with substance abuse/mental health issues will improve NA  Medication  Management: Evaluate patient's response, side effects, and tolerance of medication regimen.  Therapeutic Interventions: 1 to 1 sessions, Unit Group sessions and Medication administration.  Evaluation of Outcomes: Progressing  Physician Treatment Plan for Secondary Diagnosis: Principal Problem:   Major depressive disorder, recurrent, severe with psychotic features (HCC) Active Problems:   Hypothyroidism   Suicidal ideations  Long Term Goal(s): Improvement in symptoms so as ready for discharge NA   Short Term Goals: Ability to identify changes in lifestyle to reduce recurrence of condition will improve Ability to verbalize feelings will improve Ability to disclose and discuss suicidal ideas Ability to demonstrate self-control will improve Ability to identify and develop effective coping behaviors will improve Ability to maintain clinical measurements within normal limits will improve Compliance with prescribed medications will improve Ability to identify triggers associated with substance abuse/mental health issues will improve NA     Medication Management: Evaluate patient's response, side effects, and tolerance of medication regimen.  Therapeutic Interventions: 1 to 1 sessions, Unit Group sessions and Medication administration.  Evaluation of Outcomes: Progressing   RN Treatment Plan for Primary Diagnosis: Major depressive disorder, recurrent, severe with psychotic features (HCC) Long Term Goal(s): Knowledge of disease and therapeutic regimen to maintain health will improve  Short Term Goals: Ability to participate in decision making will improve, Ability to identify and develop effective coping behaviors will improve and Compliance with prescribed medications will improve  Medication Management: RN will administer medications as ordered by provider, will assess and evaluate patient's response and provide education to patient for prescribed medication. RN will report any adverse  and/or side effects to prescribing provider.  Therapeutic Interventions: 1 on 1 counseling sessions, Psychoeducation, Medication administration, Evaluate responses to treatment, Monitor vital signs and CBGs as ordered, Perform/monitor CIWA, COWS, AIMS and Fall Risk screenings as ordered, Perform wound care treatments as ordered.  Evaluation of Outcomes: Progressing   LCSW Treatment Plan for Primary Diagnosis: Major depressive disorder, recurrent, severe with psychotic features (HCC) Long Term Goal(s): Safe transition to appropriate next level of care at discharge, Engage patient in therapeutic group addressing interpersonal concerns.  Short Term Goals: Engage patient in aftercare planning with referrals and resources, Increase emotional regulation and Identify triggers associated with mental health/substance abuse issues  Therapeutic Interventions: Assess for all discharge needs, 1 to 1 time with Social worker, Explore available resources and support systems, Assess for adequacy in community support network, Educate family and significant other(s) on suicide prevention, Complete Psychosocial Assessment, Interpersonal group therapy.  Evaluation of Outcomes: Progressing   Progress in Treatment: Attending groups: Yes. Participating in groups: Yes. Taking medication as prescribed: Yes. Toleration medication: Yes. Family/Significant other contact made: Yes, individual(s) contacted:  pt's husband Patient understands diagnosis: Yes. Discussing patient identified problems/goals with staff: Yes. Medical problems stabilized or resolved: Yes. Denies suicidal/homicidal ideation: Yes. Issues/concerns per patient self-inventory: No. Other: N/A  New problem(s) identified: No, Describe:  N/A  New Short Term/Long Term Goal(s): Pt reported her goal for treatment is to, "learn how to control my thoughts." Pt will also work towards emotional regulation improvement, crisis stabilization, and decreasing  depression.   Discharge Plan or Barriers: Pt will be discharged home and [facility]   Reason  for Continuation of Hospitalization: Depression Medication stabilization  Estimated Length of Stay: 1-3 days   Attendees: Patient: 03/19/2017   Physician: Dr. Jennet MaduroPucilowska, MD 03/19/2017   Nursing: Leonia ReaderPhyllis Cobb, RN 03/19/2017   RN Care Manager: 03/19/2017   Social Worker: Daleen SquibbGreg Wierda, LCSW 03/19/2017  Recreational Therapist:  03/19/2017  Other: Heidi DachKelsey Kassandra Meriweather, LCSW 03/19/2017  Other: Johny ShearsCassandra Jarrett, LCSWA 03/19/2017  Other: 03/19/2017      Scribe for Treatment Team: Heidi DachKelsey Erminie Foulks, LCSW 03/19/2017 11:03 AM

## 2017-03-19 NOTE — [facility] Group Notes (Signed)
[facility] Group Notes:  (Nursing/MHT/Case Management/Adjunct)  Date:  03/19/2017  Time:  3:14 PM  Type of Therapy:  Psychoeducational Skills  Participation Level:  Minimal  Participation Quality:  Inattentive and left group and came back approx 20 min later  Affect:  Appropriate  Cognitive:  Appropriate  Insight:  Appropriate  Engagement in Group:  Engaged  Modes of Intervention:  Discussion, Education and Support  Summary of Progress/Problems:  Victoria SmokeCara Galvan Victoria Galvan 03/19/2017, 3:14 PM

## 2017-03-19 NOTE — Progress Notes (Signed)
Patient ID: Victoria Galvan, female   DOB: [birth year], 46 y.o.   MRN: [id number]  DAR Note  Pt observed in the dayroom interacting with peers. Pt at the time of assessment moderate anxiety and chronic severe neck pain; "this pain don't help with my anxiety."-see MAR Pt denied depression, SI, HI, pain or AVH. Support, encouragement, and safe environment provided.  15-minute safety checks continue. All Pt's questions and concerns addressed. Pt was med compliant. Remained calm and cooperative. Pt was med compliant.

## 2017-03-19 NOTE — Progress Notes (Signed)
[facility] MD Progress Note  03/19/2017 10:57 AM Victoria Galvan  MRN:  [id number]  Subjective:   After apparent improvement over the holiday weekend the patient today is withdrawn, hardly engaging in the interview, and very tearful. She reports suicidal ideation with a plan to overdose. She has been in the hospital for 7 days. 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 She complains that she did not have, but one, therapy sessions but admits that she has not been participating in groups. She discloses today fro the first time to me that she has been grieving the loss of her sister and son last year and that holiday season is especially difficult for her.   Treatment plan. We continue Seroquel 400 mg nightly. Effexor was discontinued due to diarrhea and Trileptal due to skin rash. We may restart Cymbalta that was well tolerated in the past but unaffordable.  Social/disposition. She will be discharged to home. She will follow up with [facility].   Principal Problem: Major depressive disorder, recurrent, severe with psychotic features (HCC) Diagnosis:   Patient Active Problem List   Diagnosis Date Noted  . Major depressive disorder, recurrent, severe with psychotic features (HCC) [F33.3] 03/12/2017  . Suicidal ideations [R45.851]   . MDD (major depressive disorder), recurrent episode, severe (HCC) [F33.2] 05/03/2016  . GAD (generalized anxiety disorder) [F41.1] 05/13/2015  . Myalgia [M79.10] 08/07/2014  . Positive ANA (antinuclear antibody) [R76.8] 08/07/2014  . Hypothyroidism [E03.9] 08/07/2014   Total Time spent with patient: 20 minutes  Past Psychiatric History: Depression, chronic pain.  Past Medical History:  Past Medical History:  Diagnosis Date  . Anxiety disorder    with panic  . Cervical spinal stenosis    spondylosis  . Chronic nausea    likely from chronic narcotic pain med use  . Chronic neck pain   . Colon polyps    Pt states  "colon polyp was removed when they did my hysterectomy"--need old records to veriffy.  . Connective tissue disease (HCC)    Dr. Malena Catholic.  Started plaquenil 09/2014.  Marland Kitchen Depression   . Fibromyalgia    per pt report  . History of cervical cancer   . History of frequent urinary tract infections   . Hypothyroidism    med noncompliance  . Positive ANA (antinuclear antibody)   . Tobacco dependence     Past Surgical History:  Procedure Laterality Date  . Adhesions removed  2008  . ESOPHAGOGASTRODUODENOSCOPY  2005   mild gastritis; h pylori neg.  Says she has had esoph dilation several times  . holter monitor  2004   Normal  . MRI brain  2002;2004   Normal  . OOPHORECTOMY  2007   right-path neg  . OOPHORECTOMY  2012   left  . TONSILLECTOMY  1978  . TRANSTHORACIC ECHOCARDIOGRAM  2004   Normal  . VAGINAL HYSTERECTOMY  1999   for cervical cancer per pt   Family History:  Family History  Problem Relation Age of Onset  . Arthritis Mother   . Cancer Mother        Lung  . Arthritis Father    Family Psychiatric  History: depression and anxiety. Social History:  Social History   Substance and Sexual Activity  Alcohol Use No  . Alcohol/week: 0.0 oz  . Frequency: Never   Comment: occasionally     Social History   Substance and Sexual Activity  Drug Use No  Social History   Socioeconomic History  . Marital status: Married    Spouse name: None  . Number of children: 2  . Years of education: College  . Highest education level: None  Social Needs  . Financial resource strain: None  . Food insecurity - worry: None  . Food insecurity - inability: None  . Transportation needs - medical: None  . Transportation needs - non-medical: None  Occupational History  . Occupation: Scientist, research (medical)CNA    Employer: UNEMPLOYED  Tobacco Use  . Smoking status: Current Every Day Smoker    Packs/day: 0.50    Types: Cigarettes  . Smokeless tobacco: Never Used  Substance and Sexual Activity  .  Alcohol use: No    Alcohol/week: 0.0 oz    Frequency: Never    Comment: occasionally  . Drug use: No  . Sexual activity: Yes    Birth control/protection: Surgical  Other Topics Concern  . None  Social History Narrative   Married.   Education: GED, Copywriter, advertisingCNA training.   Occupation: unemployed, formerly worked as a LawyerCNA.   Additional Social History:                         Sleep: Fair  Appetite:  Fair  Current Medications: Current Facility-Administered Medications  Medication Dose Route Frequency Provider Last Rate Last Dose  . acetaminophen (TYLENOL) tablet 650 mg  650 mg Oral Q6H PRN Basya Casavant B, MD   650 mg at 03/13/17 0949  . acidophilus (RISAQUAD) capsule 2 capsule  2 capsule Oral TID Ramonita LabGouru, Aruna, MD   2 capsule at 03/18/17 1653  . gabapentin (NEURONTIN) capsule 300 mg  300 mg Oral TID Fredrika Canby B, MD   300 mg at 03/18/17 1653  . hydrocerin (EUCERIN) cream   Topical BID Aundria Rudakesh, Gopalkumar, MD      . levothyroxine (SYNTHROID, LEVOTHROID) tablet 100 mcg  100 mcg Oral QAC breakfast Kadience Macchi B, MD   100 mcg at 03/18/17 0906  . loperamide (IMODIUM) capsule 2 mg  2 mg Oral Q4H PRN Aundria Rudakesh, Gopalkumar, MD   2 mg at 03/16/17 0631  . oxyCODONE-acetaminophen (PERCOCET/ROXICET) 5-325 MG per tablet 2 tablet  2 tablet Oral Q4H PRN Radhika Dershem B, MD   2 tablet at 03/19/17 1003  . QUEtiapine (SEROQUEL) tablet 400 mg  400 mg Oral QHS Aundria Rudakesh, Gopalkumar, MD   400 mg at 03/18/17 2146   Facility-Administered Medications Ordered in Other Encounters  Medication Dose Route Frequency Provider Last Rate Last Dose  . gabapentin (NEURONTIN) tablet 300 mg  300 mg Oral TID Roscoe Witts B, MD      . hydrOXYzine (ATARAX/VISTARIL) tablet 50 mg  50 mg Oral TID PRN Jamiesha Victoria B, MD      . nicotine (NICODERM CQ - dosed in mg/24 hours) patch 21 mg  21 mg Transdermal Q0600 Duanna Runk B, MD        Lab Results: No results found for this or any  previous visit (from the past 48 hour(s)).  Blood Alcohol level:  Lab Results  Component Value Date   ETH <10 03/12/2017   ETH 264 (H) 05/02/2016    Metabolic Disorder Labs: Lab Results  Component Value Date   HGBA1C 5.2 05/05/2016   MPG 103 05/05/2016   No results found for: PROLACTIN Lab Results  Component Value Date   CHOL 198 05/05/2016   TRIG 92 05/05/2016   HDL 51 05/05/2016   CHOLHDL 3.9 05/05/2016  VLDL 18 05/05/2016   LDLCALC 129 (H) 05/05/2016    Physical Findings: AIMS:  , ,  ,  ,    CIWA:    COWS:     Musculoskeletal: Strength & Muscle Tone: within normal limits Gait & Station: normal Patient leans: N/A  Psychiatric Specialty Exam: Physical Exam  Nursing note and vitals reviewed. Psychiatric: Her affect is labile. Her speech is delayed. She is withdrawn. Cognition and memory are normal. She expresses impulsivity. She exhibits a depressed mood. She expresses suicidal ideation. She expresses suicidal plans.    Review of Systems  Gastrointestinal: Positive for diarrhea.  Neurological: Negative.   Psychiatric/Behavioral: Positive for depression and suicidal ideas. The patient is nervous/anxious.   All other systems reviewed and are negative.   Blood pressure 120/65, pulse 74, temperature 98.2 F (36.8 C), temperature source Oral, resp. rate 18, height  (1.549 m), weight 61.7 kg (136 lb), SpO2 96 %.Body mass index is 25.7 kg/m.  General Appearance: Casual  Eye Contact:  Poor  Speech:  Slow  Volume:  Decreased  Mood:  Depressed  Affect:  Tearful  Thought Process:  Goal Directed and Descriptions of Associations: Intact  Orientation:  Full (Time, Place, and Person)  Thought Content:  WDL  Suicidal Thoughts:  Yes.  with intent/plan  Homicidal Thoughts:  No  Memory:  Immediate;   Fair Recent;   Fair Remote;   Fair  Judgement:  Poor  Insight:  Shallow  Psychomotor Activity:  Psychomotor Retardation  Concentration:  Concentration: Fair and  Attention Span: Fair  Recall:  [employer] of Knowledge:  Fair  Language:  Fair  Akathisia:  No  Handed:  Right  AIMS (if indicated):     Assets:  Communication Skills Desire for Improvement Housing Resilience Social Support  ADL's:  Intact  Cognition:  WNL  Sleep:  Number of Hours: 5.75     Treatment Plan Summary: Daily contact with patient to assess and evaluate symptoms and progress in treatment and Medication management   Ms. Ragon is a 46 year old female with a history of recurrent depression admitted for suicidal ideation in the context of treatment noncompliance.    #Suicidal ideation, still suicidal -patient is able to contract for safety in the hospital  #Mood, still depressed -continue Seroquel to 400 mg nightly  -start Cymbalta 30 mg daily   #Hypothyroidism -continue Synthroid 100 mcg daily  #Chronic pain -continue Percocet 10 mg every 4-6 hours PRN -continue neurontin 300 mg TID  #Diarrhea, resolved  #Metabolic syndrome monitoring -TSH is normal -lipid panel and HgbA1C are pending -EKG pending  #Disposition -discharge with family -follow up with [facility]    Kristine Linea, MD 03/19/2017, 10:57 AM

## 2017-03-19 NOTE — [facility] Group Notes (Signed)
LCSW Group Therapy Note   03/19/2017 9:30am   Type of Therapy and Topic:  Group Therapy:  Overcoming Obstacles   Participation Level:  Did Not Attend   Description of Group:    In this group patients will be encouraged to explore what they see as obstacles to their own wellness and recovery. They will be guided to discuss their thoughts, feelings, and behaviors related to these obstacles. The group will process together ways to cope with barriers, with attention given to specific choices patients can make. Each patient will be challenged to identify changes they are motivated to make in order to overcome their obstacles. This group will be process-oriented, with patients participating in exploration of their own experiences as well as giving and receiving support and challenge from other group members.   Therapeutic Goals: 1. Patient will identify personal and current obstacles as they relate to admission. 2. Patient will identify barriers that currently interfere with their wellness or overcoming obstacles.  3. Patient will identify feelings, thought process and behaviors related to these barriers. 4. Patient will identify two changes they are willing to make to overcome these obstacles:      Summary of Patient Progress      Therapeutic Modalities:   Cognitive Behavioral Therapy Solution Focused Therapy Motivational Interviewing Relapse Prevention Therapy  Victoria MacSara P Almin Livingstone, LCSW 03/19/2017 4:43 PM

## 2017-03-20 DIAGNOSIS — Z5181 Encounter for therapeutic drug level monitoring: Secondary | ICD-10-CM

## 2017-03-20 LAB — HEMOGLOBIN A1C
HEMOGLOBIN A1C: 5.5 % (ref 4.8–5.6)
Mean Plasma Glucose: 111.15 mg/dL

## 2017-03-20 LAB — LIPID PANEL
CHOL/HDL RATIO: 5.5 ratio
Cholesterol: 203 mg/dL — ABNORMAL HIGH (ref 0–200)
HDL: 37 mg/dL — AB (ref >40)
LDL Cholesterol: UNDETERMINED mg/dL (ref 0–99)
Triglycerides: 770 mg/dL — ABNORMAL HIGH (ref <150)
VLDL: UNDETERMINED mg/dL (ref 0–40)

## 2017-03-20 MED ORDER — OXYCODONE-ACETAMINOPHEN 5-325 MG PO TABS
1.0000 | ORAL_TABLET | ORAL | Status: DC | PRN
  Administered 2017-03-20: 1 via ORAL
  Filled 2017-03-20: qty 1

## 2017-03-20 MED ORDER — GEMFIBROZIL 600 MG PO TABS
600.0000 mg | ORAL_TABLET | Freq: Two times a day (BID) | ORAL | Status: DC
  Administered 2017-03-21: 600 mg via ORAL
  Filled 2017-03-20: qty 1

## 2017-03-20 MED ORDER — OXYCODONE HCL 5 MG PO TABS
10.0000 mg | ORAL_TABLET | ORAL | Status: DC | PRN
  Administered 2017-03-20 – 2017-03-21 (×3): 10 mg via ORAL
  Filled 2017-03-20 (×3): qty 2

## 2017-03-20 MED ORDER — PANTOPRAZOLE SODIUM 40 MG PO TBEC
40.0000 mg | DELAYED_RELEASE_TABLET | Freq: Every day | ORAL | Status: DC
  Administered 2017-03-20 – 2017-03-21 (×2): 40 mg via ORAL
  Filled 2017-03-20 (×2): qty 1

## 2017-03-20 MED ORDER — DULOXETINE HCL 30 MG PO CPEP
60.0000 mg | ORAL_CAPSULE | Freq: Every day | ORAL | Status: DC
  Administered 2017-03-21: 60 mg via ORAL
  Filled 2017-03-20: qty 2

## 2017-03-20 NOTE — [facility] Group Notes (Signed)
[facility] Group Notes:  (Nursing/MHT/Case Management/Adjunct)  Date:  03/20/2017  Time:  9:22 PM  Type of Therapy:  Psychoeducational Skills  Participation Level:  Active  Participation Quality:  Appropriate and Attentive  Affect:  Appropriate  Cognitive:  Alert and Appropriate  Insight:  Appropriate and Good  Engagement in Group:  Engaged  Modes of Intervention:  Discussion and Education  Summary of Progress/Problems:  Judene CompanionMary  Aleighna Wojtas 03/20/2017, 9:22 PM

## 2017-03-20 NOTE — [facility] Group Notes (Signed)
03/20/2017 9:30am  Type of Therapy/Topic:  Group Therapy:  Feelings about Diagnosis  Participation Level:  Did Not Attend   Description of Group:   This group will allow patients to explore their thoughts and feelings about diagnoses they have received. Patients will be guided to explore their level of understanding and acceptance of these diagnoses. Facilitator will encourage patients to process their thoughts and feelings about the reactions of others to their diagnosis and will guide patients in identifying ways to discuss their diagnosis with significant others in their lives. This group will be process-oriented, with patients participating in exploration of their own experiences, giving and receiving support, and processing challenge from other group members.   Therapeutic Goals: 1. Patient will demonstrate understanding of diagnosis as evidenced by identifying two or more symptoms of the disorder 2. Patient will be able to express two feelings regarding the diagnosis 3. Patient will demonstrate their ability to communicate their needs through discussion and/or role play  Summary of Patient Progress:  Did not attend.      Therapeutic Modalities:   Cognitive Behavioral Therapy Brief Therapy Feelings Identification    Victoria ShearsCassandra  Tammara Massing, LCSW 03/20/2017 10:40 AM
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
## 2017-03-20 NOTE — [facility] Group Notes (Signed)
[facility] Group Notes:  (Nursing/MHT/Case Management/Adjunct)  Date:  03/20/2017  Time:  4:13 AM  Type of Therapy:  Psychoeducational Skills  Participation Level:  Active  Participation Quality:  Appropriate  Affect:  Appropriate  Cognitive:  Appropriate  Insight:  Appropriate  Engagement in Group:  Engaged  Modes of Intervention:  Discussion, Socialization and Support  Summary of Progress/Problems:  Wilson SingerJustin  Vihaan Gloss 03/20/2017, 4:13 AM

## 2017-03-20 NOTE — Progress Notes (Signed)
Patient is resting quietly in her room contracting safety and denies any A,V/H, and denies any S/I, H/I noted

## 2017-03-20 NOTE — Progress Notes (Signed)
Recreation Therapy Notes   Date: 11.27.18  Time: 1:00 pm  Location: Craft Room  Behavioral response: N/A  Intervention Topic: Self- esteem  Discussion/Intervention: Patient did not attend group.   Clinical Observations/Feedback:  Patient did not attend group. Chanti Golubski LRT/CTRS         Victoria Galvan 03/20/2017 2:26 PM 

## 2017-03-20 NOTE — Progress Notes (Signed)
Recreation Therapy Notes          Victoria Galvan 03/20/2017 4:43 PM

## 2017-03-21 DIAGNOSIS — G894 Chronic pain syndrome: Secondary | ICD-10-CM | POA: Diagnosis present

## 2017-03-21 DIAGNOSIS — E781 Pure hyperglyceridemia: Secondary | ICD-10-CM | POA: Diagnosis present

## 2017-03-21 DIAGNOSIS — F17200 Nicotine dependence, unspecified, uncomplicated: Secondary | ICD-10-CM | POA: Diagnosis present

## 2017-03-21 LAB — PATHOLOGIST SMEAR REVIEW

## 2017-03-21 MED ORDER — PANTOPRAZOLE SODIUM 40 MG PO TBEC: 40.0000 mg | DELAYED_RELEASE_TABLET | Freq: Every day | ORAL | 1 refills | Status: DC

## 2017-03-21 MED ORDER — GEMFIBROZIL 600 MG PO TABS: 600.0000 mg | ORAL_TABLET | Freq: Two times a day (BID) | ORAL | 1 refills | Status: DC

## 2017-03-21 MED ORDER — LEVOTHYROXINE SODIUM 100 MCG PO TABS: 100.0000 ug | ORAL_TABLET | Freq: Every day | ORAL | 1 refills | Status: DC

## 2017-03-21 MED ORDER — HYDROXYZINE HCL 50 MG PO TABS: 50.0000 mg | ORAL_TABLET | Freq: Four times a day (QID) | ORAL | 0 refills | Status: DC | PRN

## 2017-03-21 MED ORDER — GABAPENTIN 300 MG PO CAPS: 300.0000 mg | ORAL_CAPSULE | Freq: Three times a day (TID) | ORAL | 0 refills | Status: DC

## 2017-03-21 MED ORDER — DULOXETINE HCL 60 MG PO CPEP: 60.0000 mg | ORAL_CAPSULE | Freq: Every day | ORAL | 3 refills | Status: DC

## 2017-03-21 MED ORDER — QUETIAPINE FUMARATE 400 MG PO TABS: 400.0000 mg | ORAL_TABLET | Freq: Every day | ORAL | 1 refills | Status: DC

## 2017-03-21 MED ORDER — TRAZODONE HCL 100 MG PO TABS: 100.0000 mg | ORAL_TABLET | Freq: Every day | ORAL | Status: DC

## 2017-03-21 MED ORDER — TRAZODONE HCL 100 MG PO TABS: 100.0000 mg | ORAL_TABLET | Freq: Every day | ORAL | 1 refills | Status: DC

## 2017-03-21 NOTE — BHH Suicide Risk Assessment (Deleted)
[facility]   Principal Problem: Major depressive disorder, recurrent, severe with psychotic features [facility](HCC) Discharge Diagnoses:  Patient Active Problem List   Diagnosis Date Noted  . High triglycerides [E78.1] 03/21/2017  . Major depressive disorder, recurrent, severe with psychotic features (HCC) [F33.3] 03/12/2017  . Suicidal ideations [R45.851]   . MDD (major depressive disorder), recurrent episode, severe (HCC) [F33.2] 05/03/2016  . GAD (generalized anxiety disorder) [F41.1] 05/13/2015  . Myalgia [M79.10] 08/07/2014  . Positive ANA (antinuclear antibody) [R76.8] 08/07/2014  . Hypothyroidism [E03.9] 08/07/2014    Total Time spent with patient: 30 minutes  Musculoskeletal: Strength & Muscle Tone: within normal limits Gait & Station: normal Patient leans: N/A  Psychiatric Specialty Exam: Review of Systems  Musculoskeletal: Positive for back pain and neck pain.  Neurological: Negative.   Psychiatric/Behavioral: Negative.   All other systems reviewed and are negative.   Blood pressure 111/68, pulse 89, temperature 97.6 F (36.4 C), temperature source Oral, resp. rate 18, height 5\' 1"  (1.549 m), weight 61.7 kg (136 lb), SpO2 100 %.Body mass index is 25.7 kg/m.  General Appearance: Casual  Eye Contact::  Good  Speech:  Clear and Coherent409  Volume:  Normal  Mood:  Euthymic  Affect:  Appropriate  Thought Process:  Goal Directed and Descriptions of Associations: Intact  Orientation:  Full (Time, Place, and Person)  Thought Content:  WDL  Suicidal Thoughts:  No  Homicidal Thoughts:  No  Memory:  Immediate;   Fair Recent;   Fair Remote;   Fair  Judgement:  Impaired  Insight:  Shallow  Psychomotor Activity:  Normal  Concentration:  Fair  Recall:  FiservFair  Fund of Knowledge:Fair  Language: Fair  Akathisia:  No  Handed:  Right  AIMS (if indicated):     Assets:  Communication Skills Desire for Improvement Housing Physical  Health Resilience Social Support  Sleep:  Number of Hours: 4  Cognition: WNL  ADL's:  Intact   Mental Status Per Nursing Assessment::   On Admission:     Demographic Factors:  Caucasian and Unemployed  Loss Factors: NA  Historical Factors: Anniversary of important loss and Impulsivity  Risk Reduction Factors:   Sense of responsibility to family and Positive social support  Continued Clinical Symptoms:  Depression:   Severe  Cognitive Features That Contribute To Risk:  None    Suicide Risk:  Minimal: No identifiable suicidal ideation.  Patients presenting with no risk factors but with morbid ruminations; may be classified as minimal risk based on the severity of the depressive symptoms  Follow-up Information    [facility]. Go on 03/22/2017.   Why:  [facility] on Thursday, 03/22/17 at 8:00AM. Please bring your insurance card, photo ID, proof of income (if you have it), and social security card. Thank you.  Contact information: [address] [place] [address] [phone]           Plan Of Care/Follow-up recommendations:  Activity:  as tolerated Diet:  low sodium heart healthy Other:  keep follow up appointments  Kristine LineaJolanta Pucilowska, MD 03/21/2017, 9:21 AM

## 2017-03-21 NOTE — BHH Group Notes (Signed)
03/21/2017 9:30am  Type of Therapy/Topic:  Group Therapy:  Emotion Regulation  Participation Level:  Active   Description of Group:   The purpose of this group is to assist patients in learning to regulate negative emotions and experience positive emotions. Patients will be guided to discuss ways in which they have been vulnerable to their negative emotions. These vulnerabilities will be juxtaposed with experiences of positive emotions or situations, and patients will be challenged to use positive emotions to combat negative ones. Special emphasis will be placed on coping with negative emotions in conflict situations, and patients will process healthy conflict resolution skills.  Therapeutic Goals: 1. Patient will identify two positive emotions or experiences to reflect on in order to balance out negative emotions 2. Patient will label two or more emotions that they find the most difficult to experience 3. Patient will demonstrate positive conflict resolution skills through discussion and/or role plays  Summary of Patient Progress:  Stayed entire time, actively engaged. Victoria Galvan states that she is "excited about going home". She was able to identify negative emotions that he experiences such as sadness. She engaged with other group members and commented on their statements.  Mood was good.   Therapeutic Modalities:   Cognitive Behavioral Therapy Feelings Identification Dialectical Behavioral Therapy   Johny ShearsCassandra  Makita Blow, LCSW 03/21/2017 11:38 AM

## 2017-03-21 NOTE — Progress Notes (Signed)
Recreation Therapy Notes  INPATIENT RECREATION TR PLAN  Patient Details Name: Victoria Galvan MRN: [id number] DOB: February 17, 1971 Today's Date: 03/21/2017  Rec Therapy Plan Is patient appropriate for Therapeutic Recreation?: Yes Treatment times per week: At least 3 Estimated Length of Stay: 5-7 days TR Treatment/Interventions: Group participation (Comment)(Appropriate participation in recreation therapy tx.)  Discharge Criteria Pt will be discharged from therapy if:: Discharged Treatment plan/goals/alternatives discussed and agreed upon by:: Patient/family  Discharge Summary Short term goals set: Patient will identify 3 positive coping skills to decrease depressive symptoms x5 days.  Short term goals met: Adequate for discharge Progress toward goals comments: Groups attended Which groups?: Communication(Time management, Values) Reason goals not met: N/A Therapeutic equipment acquired: N/A Reason patient discharged from therapy: Discharge from hospital Pt/family agrees with progress & goals achieved: Yes Date patient discharged from therapy: 03/21/17   Rhonda Linan 03/21/2017, 3:58 PM

## 2017-03-21 NOTE — BHH Suicide Risk Assessment (Signed)
[facility]   Principal Problem: Major depressive disorder, recurrent, severe with psychotic features [facility](HCC) Discharge Diagnoses:  Patient Active Problem List   Diagnosis Date Noted  . High triglycerides [E78.1] 03/21/2017  . Tobacco use disorder [F17.200] 03/21/2017  . Chronic pain syndrome [G89.4] 03/21/2017  . Major depressive disorder, recurrent, severe with psychotic features (HCC) [F33.3] 03/12/2017  . Suicidal ideations [R45.851]   . MDD (major depressive disorder), recurrent episode, severe (HCC) [F33.2] 05/03/2016  . GAD (generalized anxiety disorder) [F41.1] 05/13/2015  . Myalgia [M79.10] 08/07/2014  . Positive ANA (antinuclear antibody) [R76.8] 08/07/2014  . Hypothyroidism [E03.9] 08/07/2014    Total Time spent with patient: 30 minutes  Musculoskeletal: Strength & Muscle Tone: within normal limits Gait & Station: normal Patient leans: N/A  Psychiatric Specialty Exam: Review of Systems  Musculoskeletal: Positive for back pain and neck pain.  Neurological: Negative.   Psychiatric/Behavioral: Negative.   All other systems reviewed and are negative.   Blood pressure 111/68, pulse 89, temperature 97.6 F (36.4 C), temperature source Oral, resp. rate 18, height 5\' 1"  (1.549 m), weight 61.7 kg (136 lb), SpO2 100 %.Body mass index is 25.7 kg/m.  General Appearance: Casual  Eye Contact::  Good  Speech:  Clear and Coherent409  Volume:  Normal  Mood:  Euthymic  Affect:  Appropriate  Thought Process:  Goal Directed and Descriptions of Associations: Intact  Orientation:  Full (Time, Place, and Person)  Thought Content:  WDL  Suicidal Thoughts:  No  Homicidal Thoughts:  No  Memory:  Immediate;   Fair Recent;   Fair Remote;   Fair  Judgement:  Impaired  Insight:  Shallow  Psychomotor Activity:  Normal  Concentration:  Fair  Recall:  FiservFair  Fund of Knowledge:Fair  Language: Fair  Akathisia:  No  Handed:  Right  AIMS (if indicated):      Assets:  Communication Skills Desire for Improvement Housing Resilience Social Support  Sleep:  Number of Hours: 4  Cognition: WNL  ADL's:  Intact   Mental Status Per Nursing Assessment::   On Admission:     Demographic Factors:  Caucasian, Living alone and Unemployed  Loss Factors: Loss of significant relationship and Financial problems/change in socioeconomic status  Historical Factors: Prior suicide attempts, Family history of mental illness or substance abuse and Impulsivity  Risk Reduction Factors:   Sense of responsibility to family and Positive social support  Continued Clinical Symptoms:  Depression:   Insomnia Severe  Cognitive Features That Contribute To Risk:  None    Suicide Risk:  Minimal: No identifiable suicidal ideation.  Patients presenting with no risk factors but with morbid ruminations; may be classified as minimal risk based on the severity of the depressive symptoms  Follow-up Information    [facility]. Go on 03/22/2017.   Why:  [facility] is on Thursday, 03/22/17 at 8:00AM. Please bring your insurance card, photo ID, proof of income (if you have it), and social security card. Thank you.  Contact information: [address] [place] [address] [phone]           Plan Of Care/Follow-up recommendations:  Activity:  as tolerated Diet:  low sodium heart healthy Other:  keep follow up appointments  Kristine LineaJolanta Lynnie Koehler, MD 03/21/2017, 9:44 AM

## 2017-03-21 NOTE — Progress Notes (Signed)
Patient discharged on above date and time. 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 Picked up via husband to be transported home to follow up with [facility] in the a.m. Patient did not stay to receive 7 day supply of medications stating, "I'll send my husband back to pick them up." No complaints of voiced upon d/c.

## 2017-03-21 NOTE — Progress Notes (Signed)
  [facility] :  Will you be returning to the same living situation after discharge:  Yes,  with husband At discharge, do you have transportation home?: Yes,  son Do you have the ability to pay for your medications: No.  Release of information consent forms completed and in the chart;  Patient's signature needed at discharge.  Patient to Follow up at: Follow-up Information    [facility]. Go on 03/22/2017.   Why:  [facility] on Thursday, 03/22/17 at 8:00AM. Please bring your insurance card, photo ID, proof of income (if you have it), and social security card. Thank you.  Contact information: [address] [place] [phone]           Next level of care provider has [facility] Link:no  Safety Planning and Suicide Prevention discussed: Yes,  with husband  Have you used any form of tobacco in the last 30 days? (Cigarettes, Smokeless Tobacco, Cigars, and/or Pipes): Yes  Has patient been referred to the Quitline?: Patient refused referral  Patient has been referred for addiction treatment: Yes  Lorri FrederickWierda, Masaru Chamberlin Jon, LCSW 03/21/2017, 11:42 AM

## 2017-03-21 NOTE — Progress Notes (Signed)
Patient is requesting to be discharged to stay with her friend, states I don't want to go back to my husband, patient continue receive percocet every four hours for back pain, endorses for safety of self and others, denies A/V/H at this time, no distress noted respiration is normal

## 2017-03-21 NOTE — Progress Notes (Signed)
Patient scheduled to be discharged on above date. Verbally denies SI/HI/AVH. States, "I am glad that I'm leaving, I'm ready to go." Appetite fair, attend groups and actively participates. Milieu remains safe on the unit. Will continue to monitor.
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
## 2018-04-30 ENCOUNTER — Encounter (HOSPITAL_COMMUNITY): Payer: Self-pay | Admitting: Emergency Medicine

## 2018-04-30 ENCOUNTER — Other Ambulatory Visit: Payer: Self-pay

## 2018-04-30 ENCOUNTER — Emergency Department (HOSPITAL_COMMUNITY)
Admission: EM | Admit: 2018-04-30 | Discharge: 2018-04-30 | Disposition: A | Discharge status: Home / Self Care | Payer: Self-pay | Attending: Emergency Medicine | Admitting: Emergency Medicine

## 2018-04-30 ENCOUNTER — Emergency Department (HOSPITAL_COMMUNITY): Payer: Self-pay

## 2018-04-30 DIAGNOSIS — F17210 Nicotine dependence, cigarettes, uncomplicated: Secondary | ICD-10-CM | POA: Insufficient documentation

## 2018-04-30 DIAGNOSIS — J40 Bronchitis, not specified as acute or chronic: Secondary | ICD-10-CM | POA: Insufficient documentation

## 2018-04-30 DIAGNOSIS — Z79899 Other long term (current) drug therapy: Secondary | ICD-10-CM | POA: Insufficient documentation

## 2018-04-30 DIAGNOSIS — Z720 Tobacco use: Secondary | ICD-10-CM

## 2018-04-30 DIAGNOSIS — E039 Hypothyroidism, unspecified: Secondary | ICD-10-CM | POA: Insufficient documentation

## 2018-04-30 MED ORDER — DOXYCYCLINE HYCLATE 100 MG PO TABS
100.0000 mg | ORAL_TABLET | Freq: Once | ORAL | Status: AC
  Administered 2018-04-30: 100 mg via ORAL
  Filled 2018-04-30: qty 1

## 2018-04-30 MED ORDER — ALBUTEROL SULFATE (2.5 MG/3ML) 0.083% IN NEBU
5.0000 mg | INHALATION_SOLUTION | Freq: Once | RESPIRATORY_TRACT | Status: AC
  Administered 2018-04-30: 5 mg via RESPIRATORY_TRACT
  Filled 2018-04-30: qty 6

## 2018-04-30 MED ORDER — DOXYCYCLINE HYCLATE 100 MG PO CAPS: 100.0000 mg | ORAL_CAPSULE | Freq: Two times a day (BID) | ORAL | 0 refills | Status: DC

## 2018-04-30 NOTE — Discharge Instructions (Addendum)
Try to stop smoking.  Use Robitussin-DM for cough.  Make sure you are drinking plenty of fluids like water.  Start the antibiotic prescription tomorrow morning.  Return here if needed for problems.

## 2018-04-30 NOTE — ED Provider Notes (Signed)
[facility] EMERGENCY DEPARTMENT Provider Note   CSN: [id number] Arrival date & time: 04/30/18  2109     History   Chief Complaint Chief Complaint  Patient presents with  . Shortness of Breath    HPI Victoria Galvan is a 48 y.o. female.  HPI   She presents for evaluation of cough, with sputum production, shortness of breath, nausea, vomiting and diarrhea, for 3 weeks.  He is coughing up sputum which is yellow in color.  She is a tobacco smoker.  She denies chest pain, weakness or dizziness.  There are no other known modifying factors.  Past Medical History:  Diagnosis Date  . Anxiety disorder    with panic  . Cervical spinal stenosis    spondylosis  . Chronic nausea    likely from chronic narcotic pain med use  . Chronic neck pain   . Colon polyps    Pt states "colon polyp was removed when they did my hysterectomy"--need old records to veriffy.  . Connective tissue disease (HCC)    Dr. Malena Catholic.  Started plaquenil 09/2014.  Marland Kitchen Depression   . Fibromyalgia    per pt report  . History of cervical cancer   . History of frequent urinary tract infections   . Hypothyroidism    med noncompliance  . Positive ANA (antinuclear antibody)   . Tobacco dependence     Patient Active Problem List   Diagnosis Date Noted  . High triglycerides 03/21/2017  . Tobacco use disorder 03/21/2017  . Chronic pain syndrome 03/21/2017  . Major depressive disorder, recurrent, severe with psychotic features (HCC) 03/12/2017  . Suicidal ideations   . MDD (major depressive disorder), recurrent episode, severe (HCC) 05/03/2016  . GAD (generalized anxiety disorder) 05/13/2015  . Myalgia 08/07/2014  . Positive ANA (antinuclear antibody) 08/07/2014  . Hypothyroidism 08/07/2014    Past Surgical History:  Procedure Laterality Date  . Adhesions removed  2008  . ESOPHAGOGASTRODUODENOSCOPY  2005   mild gastritis; h pylori neg.  Says she has had esoph dilation several times  . holter monitor   2004   Normal  . MRI brain  2002;2004   Normal  . OOPHORECTOMY  2007   right-path neg  . OOPHORECTOMY  2012   left  . TONSILLECTOMY  1978  . TRANSTHORACIC ECHOCARDIOGRAM  2004   Normal  . VAGINAL HYSTERECTOMY  1999   for cervical cancer per pt     OB History   No obstetric history on file.      Home Medications    Prior to Admission medications   Medication Sig Start Date End Date Taking? Authorizing Provider  gabapentin (NEURONTIN) 300 MG capsule Take 1 capsule (300 mg total) by mouth 4 (four) times daily - after meals and at bedtime. Patient taking differently: Take 300 mg by mouth 5 (five) times daily.  03/21/17  Yes Pucilowska, Jolanta B, MD  naloxone (NARCAN) nasal spray 4 mg/0.1 mL Place 1 spray into the nose once.  03/20/18  Yes [provider]  oxyCODONE-acetaminophen (PERCOCET) 10-325 MG tablet Take 1 tablet by mouth 4 (four) times daily.    Yes [provider]  doxycycline (VIBRAMYCIN) 100 MG capsule Take 1 capsule (100 mg total) by mouth 2 (two) times daily. 04/30/18   Mancel Bale, MD    Family History Family History  Problem Relation Age of Onset  . Arthritis Mother   . Cancer Mother        Lung  . Arthritis Father  Social History Social History   Tobacco Use  . Smoking status: Current Every Day Smoker    Packs/day: 0.50    Types: Cigarettes  . Smokeless tobacco: Never Used  Substance Use Topics  . Alcohol use: No    Alcohol/week: 0.0 standard drinks    Frequency: Never    Comment: occasionally  . Drug use: No     Allergies   Patient has no known allergies.   Review of Systems Review of Systems  All other systems reviewed and are negative.    Physical Exam Updated Vital Signs BP 125/83 (BP Location: Right Arm)   Pulse 68   Temp 98.2 F (36.8 C) (Oral)   Resp 18   Ht 5\' 1"  (1.549 m)   Wt 61.2 kg   SpO2 99%   BMI 25.51 kg/m   Physical Exam Vitals signs and nursing note reviewed.  Constitutional:       Appearance: She is well-developed.  HENT:     Head: Normocephalic and atraumatic.     Right Ear: External ear normal.     Left Ear: External ear normal.     Nose: Congestion present.     Mouth/Throat:     Mouth: Mucous membranes are moist.     Pharynx: Oropharynx is clear.  Eyes:     Conjunctiva/sclera: Conjunctivae normal.     Pupils: Pupils are equal, round, and reactive to light.  Neck:     Musculoskeletal: Normal range of motion and neck supple.     Trachea: Phonation normal.  Cardiovascular:     Rate and Rhythm: Normal rate and regular rhythm.     Heart sounds: Normal heart sounds. No murmur.  Pulmonary:     Effort: Pulmonary effort is normal. No respiratory distress.     Breath sounds: Normal breath sounds. No stridor. No rhonchi.  Chest:     Chest wall: No tenderness.  Abdominal:     Palpations: Abdomen is soft.     Tenderness: There is no abdominal tenderness.  Musculoskeletal: Normal range of motion.        General: No swelling or tenderness.     Right lower leg: No edema.     Left lower leg: No edema.  Skin:    General: Skin is warm and dry.  Neurological:     Mental Status: She is alert and oriented to person, place, and time.     Cranial Nerves: No cranial nerve deficit.     Sensory: No sensory deficit.     Motor: No abnormal muscle tone.     Coordination: Coordination normal.  Psychiatric:        Behavior: Behavior normal.        Thought Content: Thought content normal.        Judgment: Judgment normal.      ED Treatments / Results  Labs (all labs ordered are listed, but only abnormal results are displayed) Labs Reviewed - No data to display  EKG EKG Interpretation  Date/Time:  Tuesday April 30 2018 21:37:09 EST Ventricular Rate:  63 PR Interval:    QRS Duration: 101 QT Interval:  408 QTC Calculation: 418 R Axis:   81 Text Interpretation:  Sinus rhythm RSR' in V1 or V2, probably normal variant ST elev, probable normal early repol pattern  Baseline wander in lead(s) V1 since last tracing no significant change Confirmed by Mancel Bale [phone]) on 04/30/2018 11:08:55 PM   Radiology Dg Chest 2 View  Result Date: 04/30/2018 CLINICAL DATA:  Cough and shortness of breath. EXAM: CHEST - 2 VIEW COMPARISON:  Chest x-ray dated March 12, 2017. FINDINGS: The heart size and mediastinal contours are within normal limits. Both lungs are clear. The visualized skeletal structures are unremarkable. IMPRESSION: No active cardiopulmonary disease. Electronically Signed   By: Obie DredgeWilliam T Derry M.D.   On: 04/30/2018 22:00    Procedures Procedures (including critical care time)  Medications Ordered in ED Medications  albuterol (PROVENTIL) (2.5 MG/3ML) 0.083% nebulizer solution 5 mg (5 mg Nebulization Given 04/30/18 2135)  doxycycline (VIBRA-TABS) tablet 100 mg (100 mg Oral Given 04/30/18 2334)     Initial Impression / Assessment and Plan / ED Course  I have reviewed the triage vital signs and the nursing notes.  Pertinent labs & imaging results that were available during my care of the patient were reviewed by me and considered in my medical decision making (see chart for details).      No data found.  At D/C- Reevaluation with update and discussion. After initial assessment and treatment, an updated evaluation reveals she is comfortable has no further complaints.  Findings discussed and questions answered. Mancel BaleElliott Damareon Lanni   Medical Decision Making: Evaluation consistent with bronchitis related tobacco abuse.  Doubt pneumonia, sepsis or metabolic instability  CRITICAL CARE-no Performed by: Mancel BaleElliott Tate Jerkins  Nursing Notes Reviewed/ Care Coordinated Applicable Imaging Reviewed Interpretation of Laboratory Data incorporated into ED treatment  The patient appears reasonably screened and/or stabilized for discharge and I doubt any other medical condition or other [facility] requiring further screening, evaluation, or treatment in the ED at this time prior  to discharge.  Plan: Home Medications-continue usual medication, use OTC analgesia/antipyretic as needed; Home Treatments-stop smoking; return here if the recommended treatment, does not improve the symptoms; Recommended follow up-PCP, PRN   Final Clinical Impressions(s) / ED Diagnoses   Final diagnoses:  Bronchitis  Tobacco abuse    ED Discharge Orders         Ordered    doxycycline (VIBRAMYCIN) 100 MG capsule  2 times daily     04/30/18 2257           Mancel BaleWentz, Antavius Sperbeck, MD 05/02/18 0001

## 2018-04-30 NOTE — ED Notes (Signed)
EKG to Dr.Wentz

## 2018-04-30 NOTE — ED Notes (Signed)
Respiratory paged at this time for tx.  

## 2018-04-30 NOTE — ED Triage Notes (Signed)
Coughing x 3 week, with green sputum, sob, Been using OTC medication for cough and cold

## 2019-04-14 IMAGING — DX DG CHEST 2V
2 series · 2 of 2 positions shown · non-contrast
Comparison: CT chest 09/22/2016.  PA and lateral chest 07/24/2016.

CLINICAL DATA: Chronic cough and shortness of breath. Elevated
white blood cell count today.

EXAM:
CHEST  2 VIEW

[chest pa]
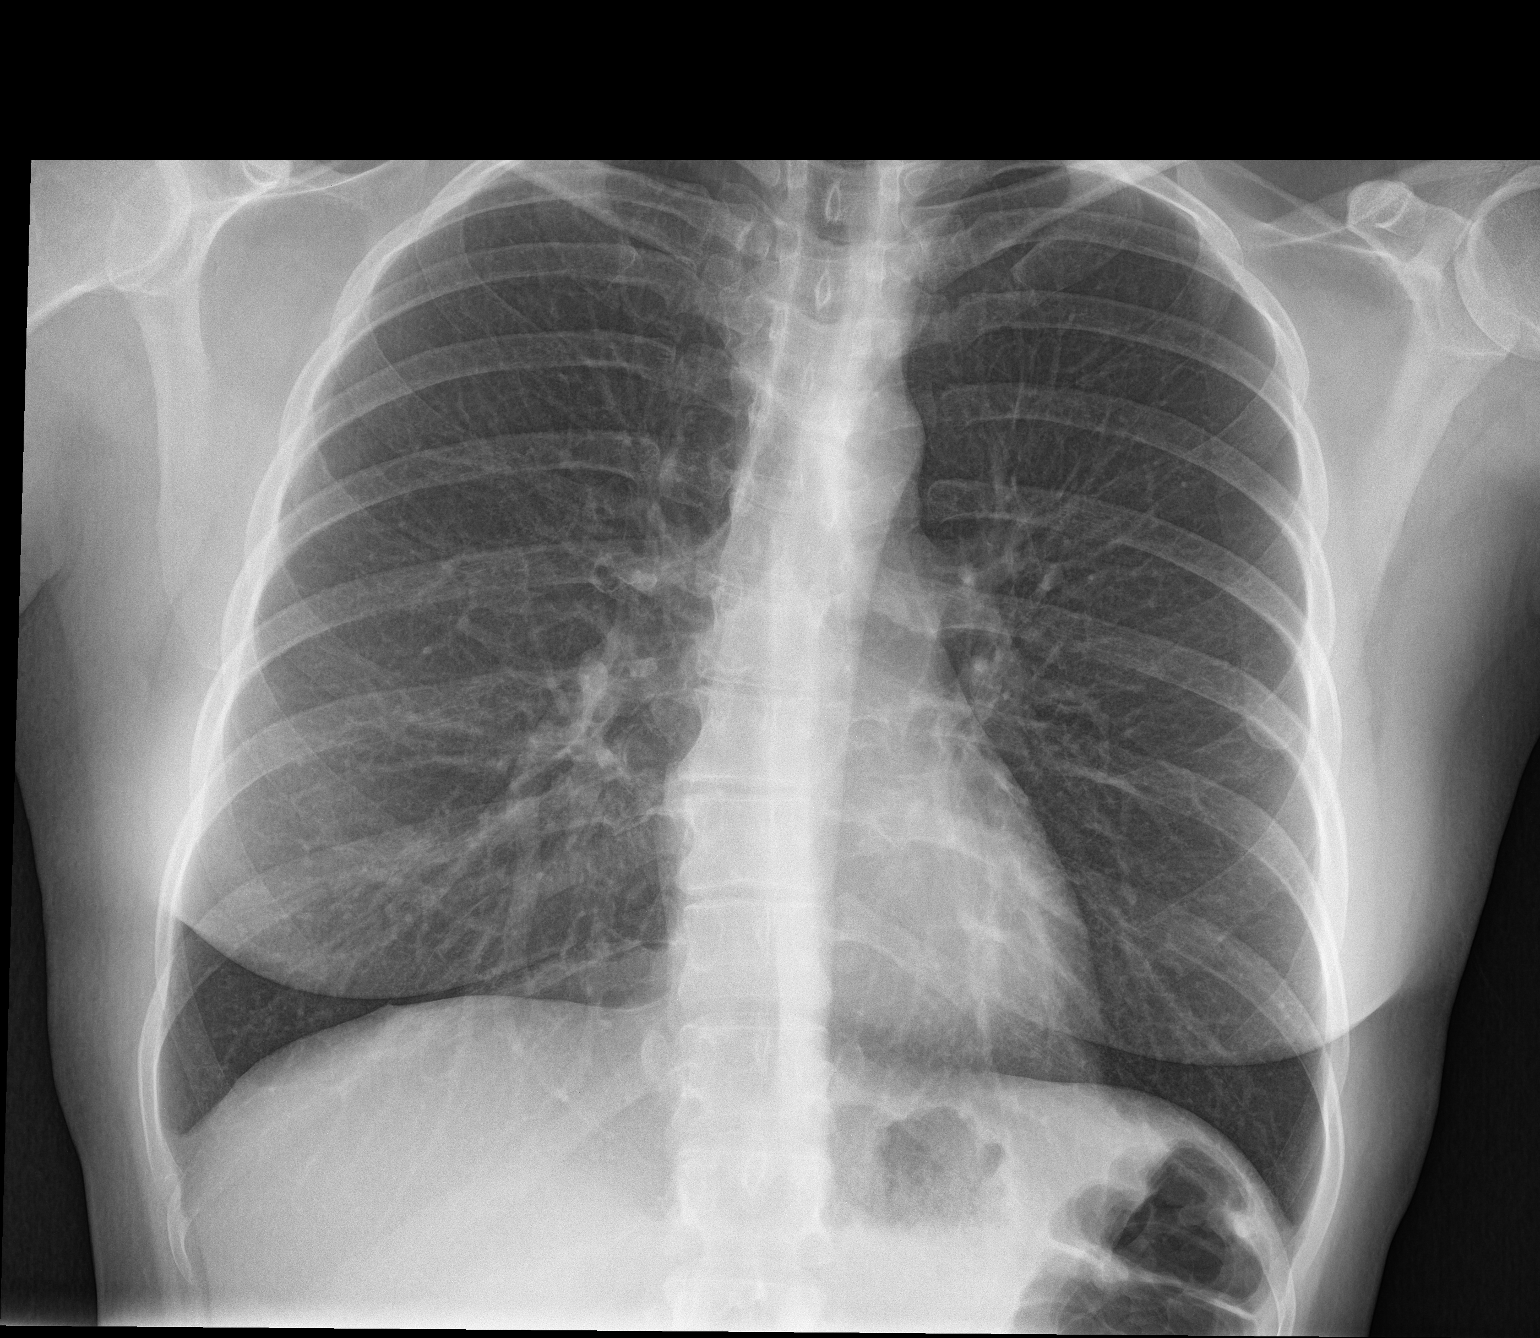

[chest lat]
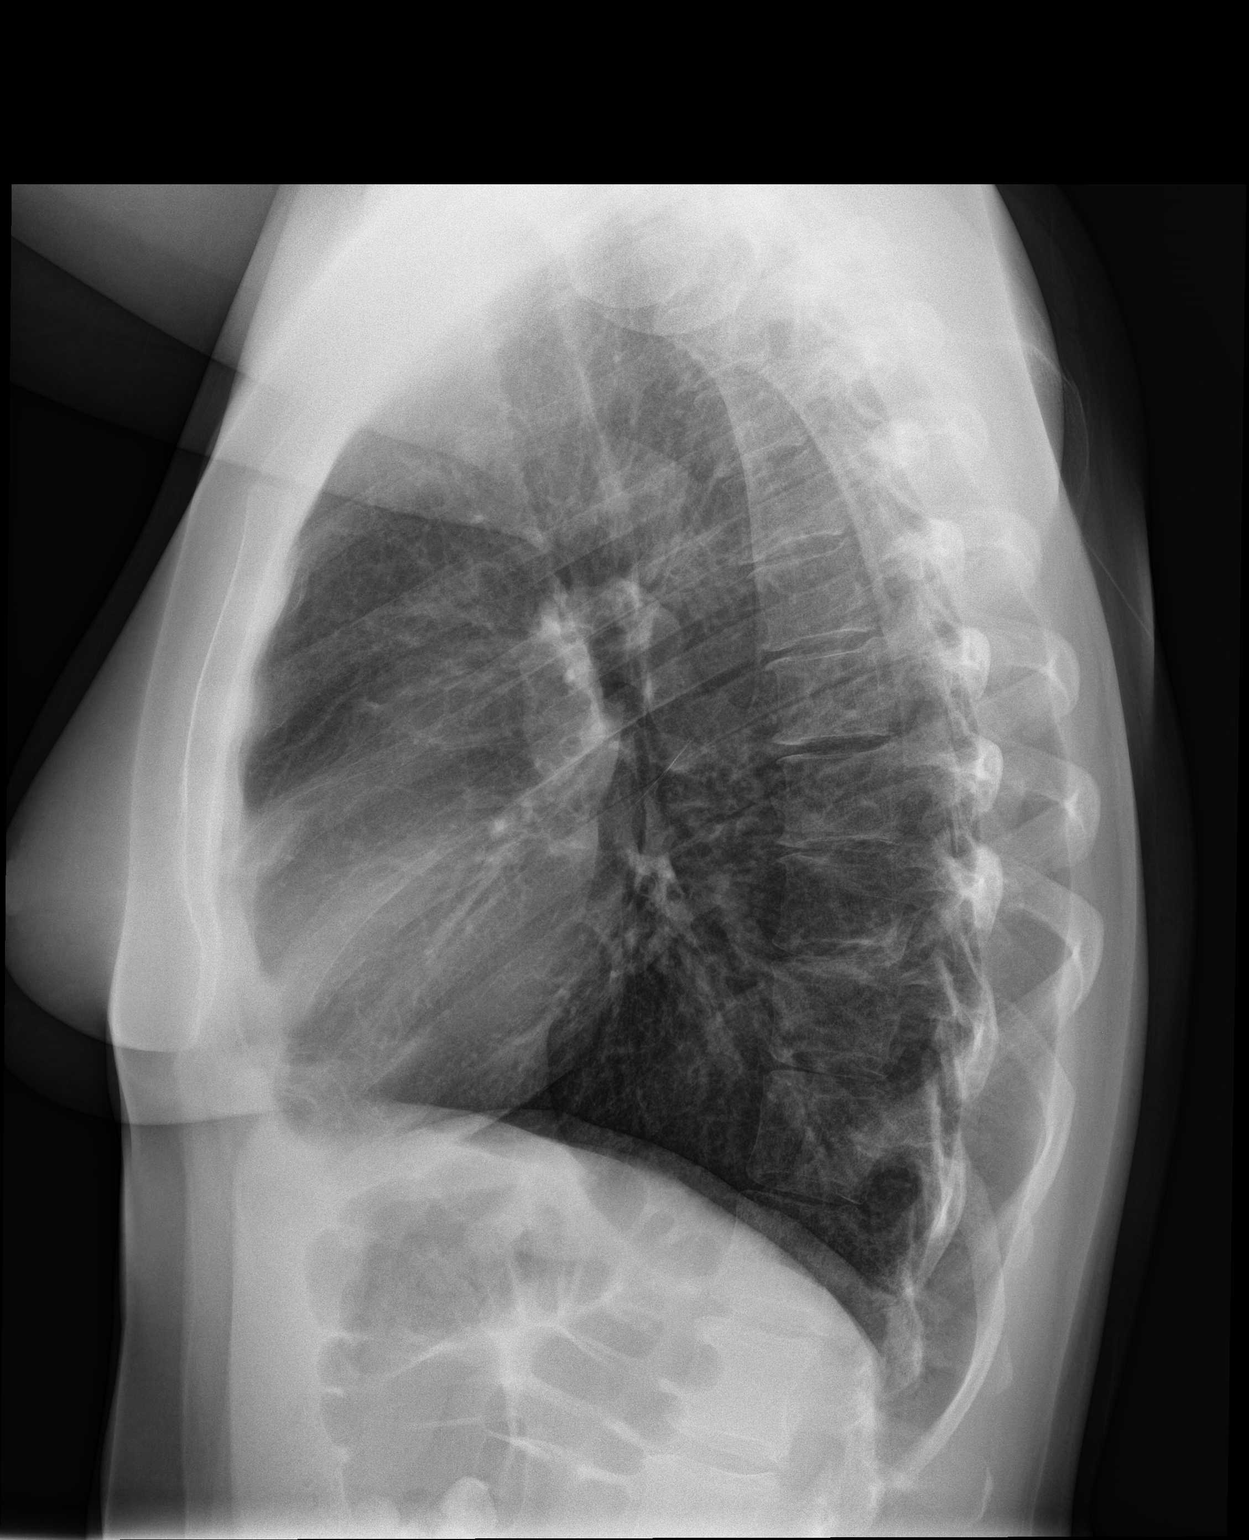

[2 of 2 positions shown; findings below may reference images not displayed]

FINDINGS: The lungs are clear. Heart size is normal. No pneumothorax or
pleural effusion. No acute bony abnormality.
IMPRESSION: Negative chest.

## 2021-08-16 ENCOUNTER — Encounter (HOSPITAL_COMMUNITY): Payer: Self-pay | Admitting: Emergency Medicine

## 2021-08-16 ENCOUNTER — Emergency Department (HOSPITAL_COMMUNITY)
Admission: EM | Admit: 2021-08-16 | Discharge: 2021-08-17 | Discharge status: Against Medical Advice | Payer: Self-pay | Attending: Emergency Medicine | Admitting: Emergency Medicine

## 2021-08-16 ENCOUNTER — Other Ambulatory Visit: Payer: Self-pay

## 2021-08-16 DIAGNOSIS — Z5321 Procedure and treatment not carried out due to patient leaving prior to being seen by health care provider: Secondary | ICD-10-CM | POA: Insufficient documentation

## 2021-08-16 DIAGNOSIS — T401X1A Poisoning by heroin, accidental (unintentional), initial encounter: Secondary | ICD-10-CM | POA: Insufficient documentation

## 2021-08-16 DIAGNOSIS — T40411A Poisoning by fentanyl or fentanyl analogs, accidental (unintentional), initial encounter: Secondary | ICD-10-CM | POA: Insufficient documentation

## 2021-08-16 NOTE — ED Triage Notes (Signed)
Pt overdosed on fentanyl and heroin. Ems gave a total of 8mg  of narcan intranasal.  ?

## 2021-08-17 NOTE — ED Notes (Signed)
Pt up pacing in room, wants to leave. Dr Stark Jock notified, and he states pt must sign AMA papers if she wants to leave ?

## 2022-08-13 NOTE — ED Provider Notes (Signed)
 Emergency Department Provider Note    ED Clinical Impression   Final diagnoses:  Cough, unspecified type (Primary)    ED Assessment/Plan    Condition: Stable Disposition: Eloped  This chart has been completed using Engineer, civil (consulting) software, and while attempts have been made to ensure accuracy, certain words and phrases may not be transcribed as intended.   History   Chief Complaint  Patient presents with  . Cough    X1 month  . Congestion    X1 month   HPI  Victoria Galvan is a 52 y.o. female  who presents today to the  emergency department complaining of cough x 1 month per the triage note.  I went to see the pt and the pt had apparently eloped from the ED.  Her gown, bp cuff and ID bracelet were left on the bed.  I did not see or speak with the pt.    Allergies: is allergic to aspirin. Medications: has a current medication list which includes the following long-term medication(s): albuterol , duloxetine , furosemide, levothyroxine , and trazodone . PMHx:  has a past medical history of Anxiety, Cervical disc disease, Disease of thyroid  gland, Fibromyalgia, H/O: hysterectomy, Panic disorder, Rheumatoid arteritis (CMS-HCC), SLE (systemic lupus erythematosus) (CMS-HCC), and Spondylitis, cervical (CMS-HCC). PSHx:  has a past surgical history that includes ACDF (08/2015); Partial hysterectomy; Tonsillectomy; and Hysterectomy. SocHx:  reports that she has been smoking cigarettes. She has a 7.5 pack-year smoking history. She has never used smokeless tobacco. She reports that she does not drink alcohol and does not use drugs. Allergies, Medications, Medical, Surgical, and Social History were reviewed as documented above.   Social Determinants of Health with Concerns   Financial Resource Strain: Not on file  Internet Connectivity: Not on file  Food Insecurity: Not on  file  Tobacco Use: High Risk (08/13/2022)   Patient History   . Smoking Tobacco Use: Every Day   . Smokeless Tobacco Use: Never   . Passive Exposure: Not on file  Housing/Utilities: Not on file  Alcohol Use: Not on file  Transportation Needs: Not on file  Substance Use: Not on file  Health Literacy: Not on file  Physical Activity: Not on file  Interpersonal Safety: Not on file  Stress: Not on file  Intimate Partner Violence: Unknown (07/26/2021)   Received from [facility]   HITS   . Physically Hurt: Not on file   . Insult or Talk Down To: Not on file   . Threaten Physical Harm: Not on file   . Scream or Curse: Not on file  Depression: Not on file  Social Connections: Unknown (09/02/2021)   Received from [facility]   Social Network   . Social Network: Not on file     Review Of Systems  Review of Systems  Reason unable to perform ROS: pt eloped.    Physical Exam   BP 125/75   Pulse 70   Temp 36.8 C (98.2 F) (Oral)   Resp 18   Ht 154.9 cm (5' 1)   Wt 82.6 kg (182 lb)   SpO2 98%   BMI 34.39 kg/m   Physical Exam Constitutional:  Comments: PE not performed; pt eloped     ED Course  Medical Decision Making    Procedures   No results found for this visit on 08/13/22 (from the past 4464 hour(s)).   ED Results No results found for any visits on 08/13/22. No results found.  Medications Administered: Medications - No data to display  Discharge Medications (Medications Prescribed during this  ED visit and Patient's Home Medications) :    Your Medication List     ASK your doctor about these medications    albuterol  90 mcg/actuation inhaler Commonly known as: PROVENTIL  HFA;VENTOLIN  HFA Inhale 2 puffs every four (4) hours as needed for wheezing or shortness of breath.   baclofen 10 MG tablet Commonly known as: LIORESAL   DULoxetine  20 MG capsule Commonly known as: CYMBALTA  Take 2 capsules (40 mg total) by mouth daily.   furosemide 20 MG  tablet Commonly known as: LASIX TAKE 1 TABLET DAILY   hydrOXYzine  50 MG tablet Commonly known as: ATARAX  TAKE 1 TO 2 TABLETS DAILY AS NEEDED FOR ANXIETY No more RFs without a follow up appointment   levothyroxine  50 MCG tablet Commonly known as: SYNTHROID  Take 1 tablet (50 mcg total) by mouth daily.   oxyCODONE  10 mg immediate release tablet Commonly known as: ROXICODONE  Take 10 mg by mouth Every four (4) hours.   oxyCODONE  18 mg Cspt 12 hr ER capsule sprinkle Commonly known as: XTAMPZA  ER Take 18 mg by mouth every twelve (12) hours.   predniSONE  20 MG tablet Commonly known as: DELTASONE  Take 1 tablet (20 mg total) by mouth daily.   traZODone  100 MG tablet Commonly known as: DESYREL  Take 1 tablet (100 mg total) by mouth nightly.          Hunter Jeoffrey Murray, [place] 08/13/22 [phone]

## 2022-08-13 NOTE — ED Triage Notes (Signed)
 Pt came to ED by private vehicle c/o cough and congestion x1 month.

## 2023-06-06 ENCOUNTER — Emergency Department (HOSPITAL_COMMUNITY)
Admission: EM | Admit: 2023-06-06 | Discharge: 2023-06-06 | Discharge status: Against Medical Advice | Payer: Self-pay | Attending: Emergency Medicine | Admitting: Emergency Medicine

## 2023-06-06 ENCOUNTER — Encounter (HOSPITAL_COMMUNITY): Payer: Self-pay

## 2023-06-06 ENCOUNTER — Emergency Department (HOSPITAL_COMMUNITY): Payer: Self-pay

## 2023-06-06 ENCOUNTER — Other Ambulatory Visit: Payer: Self-pay

## 2023-06-06 DIAGNOSIS — R079 Chest pain, unspecified: Secondary | ICD-10-CM | POA: Insufficient documentation

## 2023-06-06 DIAGNOSIS — Z5321 Procedure and treatment not carried out due to patient leaving prior to being seen by health care provider: Secondary | ICD-10-CM | POA: Insufficient documentation

## 2023-06-06 DIAGNOSIS — R519 Headache, unspecified: Secondary | ICD-10-CM | POA: Insufficient documentation

## 2023-06-06 NOTE — ED Triage Notes (Signed)
Pt arrived via [facility] c/o chest pain that began apprx 3hrs PTA. Pt reports taking 4 baby aspirin PTA. [facility] reports administering 500cc/Normal Saline and 1 NTG PTA. Pt does endorse sternal chest pain, and mild headache in Triage.

## 2023-06-06 NOTE — ED Notes (Signed)
Called for Pt from waiting room X 3. No answer. RN tried calling Pts cell phone, left HIPAA compliant msg, No Answer.

## 2023-06-12 ENCOUNTER — Encounter (HOSPITAL_COMMUNITY): Payer: Self-pay

## 2023-06-12 ENCOUNTER — Other Ambulatory Visit: Payer: Self-pay

## 2023-06-12 ENCOUNTER — Emergency Department (HOSPITAL_COMMUNITY)
Admission: EM | Admit: 2023-06-12 | Discharge: 2023-06-13 | Disposition: A | Discharge status: Other Acute Inpatient Hospital | Payer: Self-pay | Attending: Emergency Medicine | Admitting: Emergency Medicine

## 2023-06-12 DIAGNOSIS — R45851 Suicidal ideations: Secondary | ICD-10-CM

## 2023-06-12 DIAGNOSIS — E876 Hypokalemia: Secondary | ICD-10-CM

## 2023-06-12 DIAGNOSIS — R110 Nausea: Secondary | ICD-10-CM | POA: Insufficient documentation

## 2023-06-12 DIAGNOSIS — M359 Systemic involvement of connective tissue, unspecified: Secondary | ICD-10-CM | POA: Insufficient documentation

## 2023-06-12 DIAGNOSIS — F332 Major depressive disorder, recurrent severe without psychotic features: Secondary | ICD-10-CM | POA: Insufficient documentation

## 2023-06-12 DIAGNOSIS — F1120 Opioid dependence, uncomplicated: Secondary | ICD-10-CM

## 2023-06-12 DIAGNOSIS — M351 Other overlap syndromes: Secondary | ICD-10-CM

## 2023-06-12 LAB — CBC WITH DIFFERENTIAL/PLATELET
Abs Immature Granulocytes: 0.03 10*3/uL (ref 0.00–0.07)
Basophils Absolute: 0.1 10*3/uL (ref 0.0–0.1)
Basophils Relative: 1 %
Eosinophils Absolute: 0.4 10*3/uL (ref 0.0–0.5)
Eosinophils Relative: 3 %
HCT: 42 % (ref 36.0–46.0)
Hemoglobin: 14.8 g/dL (ref 12.0–15.0)
Immature Granulocytes: 0 %
Lymphocytes Relative: 13 %
Lymphs Abs: 1.7 10*3/uL (ref 0.7–4.0)
MCH: 32 pg (ref 26.0–34.0)
MCHC: 35.2 g/dL (ref 30.0–36.0)
MCV: 90.9 fL (ref 80.0–100.0)
Monocytes Absolute: 0.8 10*3/uL (ref 0.1–1.0)
Monocytes Relative: 7 %
Neutro Abs: 9.4 10*3/uL — ABNORMAL HIGH (ref 1.7–7.7)
Neutrophils Relative %: 76 %
Platelets: 295 10*3/uL (ref 150–400)
RBC: 4.62 MIL/uL (ref 3.87–5.11)
RDW: 12.8 % (ref 11.5–15.5)
WBC: 12.4 10*3/uL — ABNORMAL HIGH (ref 4.0–10.5)
nRBC: 0 % (ref 0.0–0.2)

## 2023-06-12 LAB — ETHANOL: Alcohol, Ethyl (B): <10 mg/dL (ref <10)

## 2023-06-12 LAB — COMPREHENSIVE METABOLIC PANEL
ALT: 20 U/L (ref 0–44)
AST: 25 U/L (ref 15–41)
Albumin: 4.3 g/dL (ref 3.5–5.0)
Alkaline Phosphatase: 118 U/L (ref 38–126)
Anion gap: 10 (ref 5–15)
BUN: 8 mg/dL (ref 6–20)
CO2: 25 mmol/L (ref 22–32)
Calcium: 9.1 mg/dL (ref 8.9–10.3)
Chloride: 101 mmol/L (ref 98–111)
Creatinine, Ser: 0.8 mg/dL (ref 0.44–1.00)
GFR, Estimated: >60 mL/min (ref >60)
Glucose, Bld: 99 mg/dL (ref 70–99)
Potassium: 3.1 mmol/L — ABNORMAL LOW (ref 3.5–5.1)
Sodium: 136 mmol/L (ref 135–145)
Total Bilirubin: 0.6 mg/dL (ref 0.0–1.2)
Total Protein: 7.4 g/dL (ref 6.5–8.1)

## 2023-06-12 LAB — RAPID URINE DRUG SCREEN, HOSP PERFORMED
Amphetamines: NOT DETECTED
Barbiturates: NOT DETECTED
Benzodiazepines: POSITIVE — AB
Cocaine: NOT DETECTED
Opiates: NOT DETECTED
Tetrahydrocannabinol: NOT DETECTED

## 2023-06-12 LAB — MAGNESIUM: Magnesium: 2.1 mg/dL (ref 1.7–2.4)

## 2023-06-12 MED ORDER — LOPERAMIDE HCL 2 MG PO CAPS: 2.0000 mg | ORAL_CAPSULE | ORAL | Status: DC | PRN

## 2023-06-12 MED ORDER — HYDROXYZINE HCL 25 MG PO TABS
25.0000 mg | ORAL_TABLET | Freq: Four times a day (QID) | ORAL | Status: DC | PRN
  Administered 2023-06-13: 25 mg via ORAL
  Filled 2023-06-12: qty 1

## 2023-06-12 MED ORDER — CLONIDINE HCL 0.1 MG PO TABS: 0.1000 mg | ORAL_TABLET | Freq: Every day | ORAL | Status: DC

## 2023-06-12 MED ORDER — ALUM & MAG HYDROXIDE-SIMETH 200-200-20 MG/5ML PO SUSP: 30.0000 mL | Freq: Four times a day (QID) | ORAL | Status: DC | PRN

## 2023-06-12 MED ORDER — ONDANSETRON 4 MG PO TBDP
4.0000 mg | ORAL_TABLET | Freq: Once | ORAL | Status: AC
  Administered 2023-06-12: 4 mg via ORAL
  Filled 2023-06-12: qty 1

## 2023-06-12 MED ORDER — DICYCLOMINE HCL 10 MG PO CAPS: 20.0000 mg | ORAL_CAPSULE | Freq: Four times a day (QID) | ORAL | Status: DC | PRN

## 2023-06-12 MED ORDER — POTASSIUM CHLORIDE CRYS ER 20 MEQ PO TBCR
40.0000 meq | EXTENDED_RELEASE_TABLET | Freq: Once | ORAL | Status: AC
Start: 2023-06-12 — End: 2023-06-12
  Administered 2023-06-12: 40 meq via ORAL
  Filled 2023-06-12: qty 2

## 2023-06-12 MED ORDER — METHOCARBAMOL 500 MG PO TABS: 500.0000 mg | ORAL_TABLET | Freq: Three times a day (TID) | ORAL | Status: DC | PRN

## 2023-06-12 MED ORDER — NICOTINE 21 MG/24HR TD PT24
21.0000 mg | MEDICATED_PATCH | Freq: Every day | TRANSDERMAL | Status: DC
  Filled 2023-06-12: qty 1

## 2023-06-12 MED ORDER — CLONIDINE HCL 0.1 MG PO TABS: 0.1000 mg | ORAL_TABLET | Freq: Two times a day (BID) | ORAL | Status: DC

## 2023-06-12 MED ORDER — ONDANSETRON 4 MG PO TBDP: 4.0000 mg | ORAL_TABLET | Freq: Four times a day (QID) | ORAL | Status: DC | PRN

## 2023-06-12 MED ORDER — NAPROXEN 250 MG PO TABS: 500.0000 mg | ORAL_TABLET | Freq: Two times a day (BID) | ORAL | Status: DC | PRN

## 2023-06-12 MED ORDER — CLONIDINE HCL 0.1 MG PO TABS
0.1000 mg | ORAL_TABLET | Freq: Four times a day (QID) | ORAL | Status: DC
  Administered 2023-06-12 – 2023-06-13 (×3): 0.1 mg via ORAL
  Filled 2023-06-12 (×3): qty 1

## 2023-06-12 NOTE — ED Provider Notes (Signed)
[place] EMERGENCY DEPARTMENT AT [facility] Provider Note   CSN: [id number] Arrival date & time: 06/12/23  2058     History  Chief Complaint  Patient presents with   Suicidal Thoughts    Victoria Galvan is a 53 y.o. female.  Pt is a 53 yo female with pmhx significant for polysubstance abuse, anxiety, fibromyalgia, and depression.  Pt said she has been abusing fentanyl and wants to stop.  She has been having suicidal thoughts.  She has not used in 13 hours.  She feels nauseous.       Home Medications Prior to Admission medications   Medication Sig Start Date End Date Taking? Authorizing Provider  acetaminophen (TYLENOL) 500 MG tablet Take 1,500 mg by mouth every 6 (six) hours as needed for mild pain (pain score 1-3).   Yes [provider]  diphenhydrAMINE (BENADRYL) 25 MG tablet Take 50 mg by mouth every 6 (six) hours as needed for allergies.   Yes [provider]  loperamide (IMODIUM) 2 MG capsule Take 4 mg by mouth as needed for diarrhea or loose stools.   Yes [provider]      Allergies    Patient has no known allergies.    Review of Systems   Review of Systems  Gastrointestinal:  Positive for nausea.  All other systems reviewed and are negative.   Physical Exam Updated Vital Signs BP (!) 188/101 (BP Location: Right Arm)   Pulse (!) 105   Temp 98.7 F (37.1 C) (Oral)   Resp 17   SpO2 100%  Physical Exam Vitals and nursing note reviewed.  Constitutional:      Appearance: Normal appearance.  HENT:     Head: Normocephalic and atraumatic.     Right Ear: External ear normal.     Left Ear: External ear normal.     Nose: Nose normal.     Mouth/Throat:     Mouth: Mucous membranes are moist.     Pharynx: Oropharynx is clear.  Eyes:     Extraocular Movements: Extraocular movements intact.     Conjunctiva/sclera: Conjunctivae normal.     Pupils: Pupils are equal, round, and reactive to light.  Cardiovascular:     Rate and  Rhythm: Normal rate and regular rhythm.     Pulses: Normal pulses.     Heart sounds: Normal heart sounds.  Pulmonary:     Effort: Pulmonary effort is normal.     Breath sounds: Normal breath sounds.  Abdominal:     General: Abdomen is flat. Bowel sounds are normal.     Palpations: Abdomen is soft.  Musculoskeletal:        General: Normal range of motion.     Cervical back: Normal range of motion and neck supple.  Skin:    General: Skin is warm.     Capillary Refill: Capillary refill takes less than 2 seconds.  Neurological:     General: No focal deficit present.     Mental Status: She is alert and oriented to person, place, and time.  Psychiatric:        Thought Content: Thought content includes suicidal ideation.     ED Results / Procedures / Treatments   Labs (all labs ordered are listed, but only abnormal results are displayed) Labs Reviewed  COMPREHENSIVE METABOLIC PANEL - Abnormal; Notable for the following components:      Result Value   Potassium 3.1 (*)    All other components within normal limits  RAPID URINE DRUG SCREEN, HOSP PERFORMED - Abnormal; Notable for the following components:   Benzodiazepines POSITIVE (*)    All other components within normal limits  CBC WITH DIFFERENTIAL/PLATELET - Abnormal; Notable for the following components:   WBC 12.4 (*)    Neutro Abs 9.4 (*)    All other components within normal limits  ETHANOL  MAGNESIUM    EKG None  Radiology No results found.  Procedures Procedures    Medications Ordered in ED Medications  potassium chloride SA (KLOR-CON M) CR tablet 40 mEq (has no administration in time range)  cloNIDine (CATAPRES) tablet 0.1 mg (has no administration in time range)    Followed by  cloNIDine (CATAPRES) tablet 0.1 mg (has no administration in time range)    Followed by  cloNIDine (CATAPRES) tablet 0.1 mg (has no administration in time range)  dicyclomine (BENTYL) tablet 20 mg (has no administration in time  range)  hydrOXYzine (ATARAX) tablet 25 mg (has no administration in time range)  loperamide (IMODIUM) capsule 2-4 mg (has no administration in time range)  methocarbamol (ROBAXIN) tablet 500 mg (has no administration in time range)  naproxen (NAPROSYN) tablet 500 mg (has no administration in time range)  ondansetron (ZOFRAN-ODT) disintegrating tablet 4 mg (has no administration in time range)  alum & mag hydroxide-simeth (MAALOX/MYLANTA) 200-200-20 MG/5ML suspension 30 mL (has no administration in time range)  nicotine (NICODERM CQ - dosed in mg/24 hours) patch 21 mg (has no administration in time range)  ondansetron (ZOFRAN-ODT) disintegrating tablet 4 mg (4 mg Oral Given 06/12/23 2227)    ED Course/ Medical Decision Making/ A&P                                 Medical Decision Making Amount and/or Complexity of Data Reviewed Labs: ordered.  Risk Prescription drug management.   This patient presents to the ED for concern of si, this involves an extensive number of treatment options, and is a complaint that carries with it a high risk of complications and morbidity.  The differential diagnosis includes drug abuse, psych, electrolyte abn   Co morbidities that complicate the patient evaluation  olysubstance abuse, anxiety, fibromyalgia, and depression   Additional history obtained:  Additional history obtained from epic chart review  Lab Tests:  I Ordered, and personally interpreted labs.  The pertinent results include:  cbc with wbc sl elevated at 12.4, cmp with k low at 3.1   Medicines ordered and prescription drug management:  I ordered medication including zofran/kdur  for sx  Reevaluation of the patient after these medicines showed that the patient improved I have reviewed the patients home medicines and have made adjustments as needed   Critical Interventions:  TTS consult   Consultations Obtained:  I requested consultation with TTS,  and discussed lab and  imaging findings as well as pertinent plan - consult pending at shift change   Problem List / ED Course:  SI:  TTS consult pending Fentanyl abuse:  prn meds ordered for sx Hypokalemia:  kdur ordered   Reevaluation:  After the interventions noted above, I reevaluated the patient and found that they have :improved   Social Determinants of Health:  Lives at home   Dispostion:  After consideration of the diagnostic results and the patients response to treatment, I feel that the patent would benefit from TTS eval.          Final Clinical Impression(s) /  ED Diagnoses Final diagnoses:  Hypokalemia  Suicidal ideation  Fentanyl use disorder, severe (HCC)    Rx / DC Orders ED Discharge Orders     None         Jacalyn Lefevre, MD 06/12/23 2242

## 2023-06-12 NOTE — BH Assessment (Addendum)
TTS Note: @ 2244, the patient deferred to [facility]. Intake coordination is being handled by [place], at [phone]. The intake coordinator will notify the patient's care team  regarding the scheduled consultation with the telepsych provider. If there are any questions, please follow up with the [facility] coordinator. The patient's care team has been informed of the plan of care.

## 2023-06-12 NOTE — ED Triage Notes (Signed)
Pt POV c/o SI after trying to stop using drugs. Pt takes fentanyl  and last use was 13 hours ago. Pt states that she had thoughts of asking other addicts to shoot her in the head. Says she always has SI but that it is worse when she does not use.

## 2023-06-13 ENCOUNTER — Other Ambulatory Visit (HOSPITAL_COMMUNITY)
Admission: EM | Admit: 2023-06-13 | Discharge: 2023-06-17 | Disposition: A | Discharge status: Home / Self Care | Payer: Self-pay | Attending: Psychiatry | Admitting: Psychiatry

## 2023-06-13 DIAGNOSIS — E876 Hypokalemia: Secondary | ICD-10-CM | POA: Insufficient documentation

## 2023-06-13 DIAGNOSIS — F1123 Opioid dependence with withdrawal: Secondary | ICD-10-CM | POA: Insufficient documentation

## 2023-06-13 DIAGNOSIS — F1994 Other psychoactive substance use, unspecified with psychoactive substance-induced mood disorder: Secondary | ICD-10-CM | POA: Insufficient documentation

## 2023-06-13 DIAGNOSIS — F1190 Opioid use, unspecified, uncomplicated: Secondary | ICD-10-CM

## 2023-06-13 DIAGNOSIS — F319 Bipolar disorder, unspecified: Secondary | ICD-10-CM | POA: Insufficient documentation

## 2023-06-13 DIAGNOSIS — M351 Other overlap syndromes: Secondary | ICD-10-CM

## 2023-06-13 DIAGNOSIS — Z8249 Family history of ischemic heart disease and other diseases of the circulatory system: Secondary | ICD-10-CM | POA: Insufficient documentation

## 2023-06-13 DIAGNOSIS — F332 Major depressive disorder, recurrent severe without psychotic features: Secondary | ICD-10-CM

## 2023-06-13 DIAGNOSIS — Z79899 Other long term (current) drug therapy: Secondary | ICD-10-CM | POA: Insufficient documentation

## 2023-06-13 DIAGNOSIS — Z5971 Insufficient health insurance coverage: Secondary | ICD-10-CM | POA: Insufficient documentation

## 2023-06-13 DIAGNOSIS — M797 Fibromyalgia: Secondary | ICD-10-CM | POA: Insufficient documentation

## 2023-06-13 DIAGNOSIS — R45851 Suicidal ideations: Secondary | ICD-10-CM | POA: Insufficient documentation

## 2023-06-13 DIAGNOSIS — F1120 Opioid dependence, uncomplicated: Secondary | ICD-10-CM

## 2023-06-13 DIAGNOSIS — F1193 Opioid use, unspecified with withdrawal: Secondary | ICD-10-CM

## 2023-06-13 MED ORDER — LORAZEPAM 2 MG/ML IJ SOLN
2.0000 mg | Freq: Four times a day (QID) | INTRAMUSCULAR | Status: DC | PRN
  Filled 2023-06-13: qty 1

## 2023-06-13 MED ORDER — ALUM & MAG HYDROXIDE-SIMETH 200-200-20 MG/5ML PO SUSP
30.0000 mL | ORAL | Status: DC | PRN
  Administered 2023-06-16: 30 mL via ORAL
  Filled 2023-06-13: qty 30

## 2023-06-13 MED ORDER — NAPROXEN 500 MG PO TABS
500.0000 mg | ORAL_TABLET | Freq: Two times a day (BID) | ORAL | Status: DC | PRN
  Administered 2023-06-13 – 2023-06-17 (×4): 500 mg via ORAL
  Filled 2023-06-13 (×4): qty 1

## 2023-06-13 MED ORDER — ONDANSETRON 4 MG PO TBDP
4.0000 mg | ORAL_TABLET | Freq: Four times a day (QID) | ORAL | Status: DC | PRN
  Administered 2023-06-14 – 2023-06-17 (×5): 4 mg via ORAL
  Filled 2023-06-13 (×4): qty 1

## 2023-06-13 MED ORDER — METHOCARBAMOL 500 MG PO TABS
500.0000 mg | ORAL_TABLET | Freq: Three times a day (TID) | ORAL | Status: DC | PRN
  Administered 2023-06-13 – 2023-06-17 (×5): 500 mg via ORAL
  Filled 2023-06-13 (×5): qty 1

## 2023-06-13 MED ORDER — MAGNESIUM HYDROXIDE 400 MG/5ML PO SUSP: 30.0000 mL | Freq: Every day | ORAL | Status: DC | PRN

## 2023-06-13 MED ORDER — CLONIDINE HCL 0.1 MG PO TABS
0.1000 mg | ORAL_TABLET | Freq: Four times a day (QID) | ORAL | Status: AC
  Administered 2023-06-13 – 2023-06-14 (×4): 0.1 mg via ORAL
  Filled 2023-06-13 (×4): qty 1

## 2023-06-13 MED ORDER — HALOPERIDOL 5 MG PO TABS: 5.0000 mg | ORAL_TABLET | Freq: Three times a day (TID) | ORAL | Status: DC | PRN

## 2023-06-13 MED ORDER — HALOPERIDOL LACTATE 5 MG/ML IJ SOLN: 5.0000 mg | Freq: Three times a day (TID) | INTRAMUSCULAR | Status: DC | PRN

## 2023-06-13 MED ORDER — HYDROXYZINE HCL 25 MG PO TABS
25.0000 mg | ORAL_TABLET | Freq: Four times a day (QID) | ORAL | Status: DC | PRN
  Administered 2023-06-15 – 2023-06-16 (×3): 25 mg via ORAL
  Filled 2023-06-13 (×3): qty 1

## 2023-06-13 MED ORDER — DICYCLOMINE HCL 10 MG PO CAPS
20.0000 mg | ORAL_CAPSULE | Freq: Four times a day (QID) | ORAL | Status: DC | PRN
  Administered 2023-06-14 – 2023-06-16 (×5): 20 mg via ORAL
  Filled 2023-06-13 (×5): qty 2

## 2023-06-13 MED ORDER — NICOTINE 21 MG/24HR TD PT24
21.0000 mg | MEDICATED_PATCH | Freq: Every day | TRANSDERMAL | Status: DC
Start: 2023-06-13 — End: 2023-06-17
  Filled 2023-06-13 (×4): qty 1

## 2023-06-13 MED ORDER — CLONIDINE HCL 0.1 MG PO TABS
0.1000 mg | ORAL_TABLET | Freq: Two times a day (BID) | ORAL | Status: AC
  Administered 2023-06-15 – 2023-06-16 (×4): 0.1 mg via ORAL
  Filled 2023-06-13 (×4): qty 1

## 2023-06-13 MED ORDER — LORAZEPAM 1 MG PO TABS
1.0000 mg | ORAL_TABLET | Freq: Four times a day (QID) | ORAL | Status: DC | PRN
  Administered 2023-06-14: 1 mg via ORAL
  Filled 2023-06-13: qty 1

## 2023-06-13 MED ORDER — LOPERAMIDE HCL 2 MG PO CAPS
2.0000 mg | ORAL_CAPSULE | ORAL | Status: DC | PRN
  Administered 2023-06-14: 4 mg via ORAL
  Administered 2023-06-15 – 2023-06-16 (×3): 2 mg via ORAL
  Administered 2023-06-16: 4 mg via ORAL
  Administered 2023-06-16: 1 mg via ORAL
  Administered 2023-06-17: 4 mg via ORAL
  Filled 2023-06-13 (×4): qty 1
  Filled 2023-06-13 (×3): qty 2

## 2023-06-13 MED ORDER — ACETAMINOPHEN 325 MG PO TABS
650.0000 mg | ORAL_TABLET | Freq: Four times a day (QID) | ORAL | Status: DC | PRN
  Administered 2023-06-14 – 2023-06-16 (×3): 650 mg via ORAL
  Filled 2023-06-13 (×3): qty 2

## 2023-06-13 MED ORDER — CLONIDINE HCL 0.1 MG PO TABS
0.1000 mg | ORAL_TABLET | Freq: Every day | ORAL | Status: DC
  Administered 2023-06-17: 0.1 mg via ORAL
  Filled 2023-06-13: qty 1
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
## 2023-06-13 NOTE — ED Notes (Signed)
Pt alert and oriented during process. Pt denies HI, AVH. Pt is sad with flat affect. However, she endorses SI but contracts for safety. Education, support, reassurance, and encouragement provided, q15 minute safety checks initiated. Pt's belongings in locker #9. Pt denies any concerns at this time, and ambulating on the unit with no issues. Pt remains safe on the unit.

## 2023-06-13 NOTE — Group Note (Signed)
Group Topic: Communication  Group Date: 06/13/2023 Start Time: 2000 End Time: 2030 Facilitators: Rae Lips B  Department: [facility]  Number of Participants: 6  Group Focus: abuse issues, activities of daily living skills, check in, clarity of thought, communication, community group, and coping skills Treatment Modality:  Leisure Development and Spiritual Interventions utilized were group exercise, story telling, and support Purpose: enhance coping skills, express feelings, increase insight, regain self-worth, and reinforce self-care  Name: CHARMEL PRONOVOST Date of Birth: [birth year]  MR: [id number]    Level of Participation:  PT DID NOT ATTEND GROUP Quality of Participation: withdrawn Interactions with others: gave feedback Mood/Affect: anxious, appropriate, and irritable Triggers (if applicable): NA Cognition: coherent/clear Progress: None Response: NA Plan: patient will be encouraged to go to groups.   Patients Problems:  Patient Active Problem List   Diagnosis Date Noted   Fentanyl use disorder, severe (HCC) 06/13/2023   Mixed connective tissue disease (HCC) 06/13/2023   Substance induced mood disorder (HCC) 06/13/2023   High triglycerides 03/21/2017   Tobacco use disorder 03/21/2017   Chronic pain syndrome 03/21/2017   Major depressive disorder, recurrent, severe with psychotic features (HCC) 03/12/2017   Suicidal ideation    MDD (major depressive disorder), recurrent episode, severe (HCC) 05/03/2016   GAD (generalized anxiety disorder) 05/13/2015   Myalgia 08/07/2014   Positive ANA (antinuclear antibody) 08/07/2014   Hypothyroidism 08/07/2014

## 2023-06-13 NOTE — ED Notes (Signed)
Patient restless in bed with eyes open. Respirations equal and unlabored, skin warm and dry, NAD. Routine safety checks conducted according to facility protocol. Will continue to monitor for safety.

## 2023-06-13 NOTE — ED Notes (Addendum)
Pt changed into scrubs, belongings bagged and secured in locker 8 (2 bags), security wanded pt

## 2023-06-14 ENCOUNTER — Encounter (HOSPITAL_COMMUNITY): Payer: Self-pay | Admitting: Family

## 2023-06-14 MED ORDER — LORAZEPAM 2 MG/ML IJ SOLN: 2.0000 mg | Freq: Three times a day (TID) | INTRAMUSCULAR | Status: DC | PRN

## 2023-06-14 MED ORDER — HALOPERIDOL LACTATE 5 MG/ML IJ SOLN
5.0000 mg | Freq: Once | INTRAMUSCULAR | Status: AC
  Administered 2023-06-14: 5 mg via INTRAMUSCULAR

## 2023-06-14 MED ORDER — HALOPERIDOL LACTATE 5 MG/ML IJ SOLN
5.0000 mg | Freq: Three times a day (TID) | INTRAMUSCULAR | Status: DC | PRN
  Filled 2023-06-14: qty 1

## 2023-06-14 MED ORDER — LORAZEPAM 2 MG/ML IJ SOLN
2.0000 mg | Freq: Once | INTRAMUSCULAR | Status: AC
  Administered 2023-06-14: 2 mg via INTRAMUSCULAR

## 2023-06-14 MED ORDER — DIPHENHYDRAMINE HCL 50 MG/ML IJ SOLN
50.0000 mg | Freq: Three times a day (TID) | INTRAMUSCULAR | Status: DC | PRN
  Filled 2023-06-14: qty 1

## 2023-06-14 MED ORDER — DIPHENHYDRAMINE HCL 50 MG PO CAPS
50.0000 mg | ORAL_CAPSULE | Freq: Three times a day (TID) | ORAL | Status: DC | PRN
  Administered 2023-06-17: 50 mg via ORAL
  Filled 2023-06-14: qty 1

## 2023-06-14 MED ORDER — DIPHENHYDRAMINE HCL 50 MG/ML IJ SOLN
50.0000 mg | Freq: Once | INTRAMUSCULAR | Status: AC
  Administered 2023-06-14: 50 mg via INTRAMUSCULAR

## 2023-06-14 MED ORDER — QUETIAPINE FUMARATE 50 MG PO TABS
50.0000 mg | ORAL_TABLET | Freq: Every day | ORAL | Status: DC
  Administered 2023-06-14 – 2023-06-15 (×2): 50 mg via ORAL
  Filled 2023-06-14 (×2): qty 1

## 2023-06-14 MED ORDER — HALOPERIDOL 5 MG PO TABS
5.0000 mg | ORAL_TABLET | Freq: Three times a day (TID) | ORAL | Status: DC | PRN
  Administered 2023-06-17: 5 mg via ORAL
  Filled 2023-06-14: qty 1

## 2023-06-14 NOTE — ED Notes (Signed)
Patient was awake and out of bed briefly this morning.  She took her meds and returned to room and bed.  Patient irritable and guarded.  No distress or signs of withdrawal at this time.  Will monitor and provide support as needed.

## 2023-06-14 NOTE — ED Provider Notes (Signed)
Facility Based Crisis Admission H&P  Date: 06/14/23 Patient Name: Victoria Galvan MRN: [id number] Chief Complaint: Im addicted to fentanyl. Death is so much easier. I have suicidal thoughts daily.   Diagnoses:  Final diagnoses:  None  Subjective: The patient presents to the ED for opioid withdrawal and suicidal thoughts after attempting to detox on her own. She has a history of polysubstance abuse (fentanyl), bipolar disorder, depression, and fibromyalgia but is not on any medications due to loss of insurance. She reports past forced attempts at sobriety but now seeks treatment voluntarily, stating, "I need to get clean for myself and my husband, who has CHF." Withdrawal symptoms include hot/cold chills, agitation, burning skin sensation, rhinitis, and muscle cramps. She endorses depression, anhedonia, amotivation, restlessness, and poor sleep (2-3 hrs/24 hrs) but denies psychosis, recent mania, or self-harm behaviors.  HPI: During evaluation, the patient was observed sitting up straight in her bed, looking at the wall. She appeared to be in no acute distress but was notably irritable. She was alert and oriented 4, calm yet grudgingly cooperative, with a labile and irritable mood that was congruent with her affect.  She spoke in a clear tone, at a moderate volume, and with a normal pace. Initially, the patient became briefly irritable but eventually began to engage, though she remained guarded with this provider. Her thought process was coherent and relevant, and there was no indication that she was responding to internal or external stimuli or experiencing delusional thought content. She denied suicidal ideation during the evaluation but later endorsed active suicidal ideation without a plan and was able to contract for safety while on the unit only. The patient remained calm throughout the assessment and answered all questions appropriately.  The patient endorsed symptoms of depression, including  feelings of worthlessness, guilt, hopelessness, and recent suicidal ideation with a past plan to overdose. These symptoms have been exacerbated by ongoing polysubstance use. When assessing for protective factors, she stated that the only reason she sought help was because her husband asked her to do so. However, later in the evening, the patient requested discharge and sought assistance in arranging transportation.  At this time, the patient demonstrates some improvement in protective factors, which decrease her immediate risk for suicide completion. She will resume psychiatric medications, and ongoing detox and rehabilitation are recommended if she is open to it. Due to worsening depression and suicidal ideation in the context of substance use, involuntary commitment (IVC) and inpatient psychiatric hospitalization may need to be considered. However, the patient does not meet involuntary commitment criteria at this time should she choose to leave.  A combination of personality structure, poor maladaptive coping skills, and chronic substance abuse leading to impulsivity and mood dysregulation increases her overall risk of self-harm. However, at this time, her mood is stable, and she appears to be future-oriented. There is no immediate indication of dangerousness to self or others.  Her behaviors appear more consistent with self-preservation rather than acute suicidality. The ongoing endorsement of suicidal ideation shows clear evidence of secondary gain due to unmet needs, which reflects limited and often maladaptive coping skills rather than imminent risk of death.  PHQ 2-9:  Flowsheet Row ED from 06/13/2023 in [facility]  Thoughts that you would be better off dead, or of hurting yourself in some way Nearly every day  PHQ-9 Total Score 18       Flowsheet Row ED from 06/13/2023 in [facility] ED from 06/12/2023 in [facility] Department  at [facility] ED from 06/06/2023 in [facility] Emergency Department at [facility]  C-SSRS RISK CATEGORY High Risk High Risk No Risk       Screenings    Flowsheet Row Most Recent Value  CIWA-Ar Total 10  COWS Total Score 6       Total Time spent with patient: 30 minutes  Musculoskeletal  Strength & Muscle Tone: within normal limits Gait & Station: normal Patient leans: N/A  Psychiatric Specialty Exam  Presentation General Appearance: Appropriate for Environment; Casual  Eye Contact:Good  Speech:Clear and Coherent; Normal Rate  Speech Volume:Normal  Handedness:Right   Mood and Affect  Mood:Irritable; Labile; Angry  Affect:Congruent; Appropriate   Thought Process  Thought Processes:Coherent; Goal Directed  Descriptions of Associations:Intact  Orientation:Full (Time, Place and Person)  Thought Content:Logical    Hallucinations:Hallucinations: None  Ideas of Reference:None  Suicidal Thoughts:Suicidal Thoughts: Yes, Active SI Active Intent and/or Plan: With Intent; Without Access to Means; Without Means to Carry Out; Without Plan  Homicidal Thoughts:Homicidal Thoughts: No   Sensorium  Memory:Immediate Good; Recent Good; Remote Good  Judgment:Poor  Insight:Fair   Executive Functions  Concentration:Good  Attention Span:Fair  Recall:Fair  Fund of Knowledge:Fair  Language:Fair   Psychomotor Activity  Psychomotor Activity:WNL  Assets  Assets:Resilience, Housing, Intimacy, Finances  Sleep  Sleep:None     Physical Exam Vitals and nursing note reviewed.  Constitutional:      Appearance: Normal appearance. She is normal weight.  Skin:    General: Skin is warm.     Capillary Refill: Capillary refill takes less than 2 seconds.  Neurological:     General: No focal deficit present.     Mental Status: She is alert and oriented to person, place, and time. Mental status is at baseline.  Psychiatric:        Attention and  Perception: Attention and perception normal.        Mood and Affect: Mood is anxious and depressed. Affect is labile and angry.        Speech: Speech normal.        Behavior: Behavior is agitated. Behavior is cooperative.        Thought Content: Thought content includes suicidal ideation.        Cognition and Memory: Cognition and memory normal.        Judgment: Judgment is impulsive.    Review of Systems  Psychiatric/Behavioral:  Positive for depression, substance abuse and suicidal ideas. The patient is nervous/anxious and has insomnia.   All other systems reviewed and are negative.   Blood pressure 128/73, pulse 93, temperature 98.3 F (36.8 C), temperature source Oral, resp. rate 17, SpO2 99%. There is no height or weight on file to calculate BMI.  Past Psychiatric History:  MDD, Opioid Use DO  Is the patient at risk to self? Yes  Has the patient been a risk to self in the past 6 months? Yes .    Has the patient been a risk to self within the distant past? No   Is the patient a risk to others? No   Has the patient been a risk to others in the past 6 months? No   Has the patient been a risk to others within the distant past? No   Past Medical History: fibromyalgia Social History: married, unemployed  Last Labs:  Admission on 06/12/2023, Discharged on 06/13/2023  Component Date Value Ref Range Status   Sodium 06/12/2023 136  135 -  145 mmol/L Final   Potassium 06/12/2023 3.1 (L)  3.5 - 5.1 mmol/L Final   Chloride 06/12/2023 101  98 - 111 mmol/L Final   CO2 06/12/2023 25  22 - 32 mmol/L Final   Glucose, Bld 06/12/2023 99  70 - 99 mg/dL Final   Glucose reference range applies only to samples taken after fasting for at least 8 hours.   BUN 06/12/2023 8  6 - 20 mg/dL Final   Creatinine, Ser 06/12/2023 0.80  0.44 - 1.00 mg/dL Final   Calcium 16/01/9603 9.1  8.9 - 10.3 mg/dL Final   Total Protein 54/12/8117 7.4  6.5 - 8.1 g/dL Final   Albumin 14/78/2956 4.3  3.5 - 5.0 g/dL Final    AST 21/30/8657 25  15 - 41 U/L Final   ALT 06/12/2023 20  0 - 44 U/L Final   Alkaline Phosphatase 06/12/2023 118  38 - 126 U/L Final   Total Bilirubin 06/12/2023 0.6  0.0 - 1.2 mg/dL Final   GFR, Estimated 06/12/2023 >60  >60 mL/min Final   Comment: (NOTE) Calculated using the CKD-EPI Creatinine Equation (2021)    Anion gap 06/12/2023 10  5 - 15 Final   Performed at [facility], [address], [place], [place] [address]   Alcohol, Ethyl (B) 06/12/2023 <10  <10 mg/dL Final   Comment: (NOTE) Lowest detectable limit for serum alcohol is 10 mg/dL.  For medical purposes only. Performed at [facility], [address], [place], [place] [address]    Opiates 06/12/2023 NONE DETECTED  NONE DETECTED Final   Cocaine 06/12/2023 NONE DETECTED  NONE DETECTED Final   Benzodiazepines 06/12/2023 POSITIVE (A)  NONE DETECTED Final   Amphetamines 06/12/2023 NONE DETECTED  NONE DETECTED Final   Tetrahydrocannabinol 06/12/2023 NONE DETECTED  NONE DETECTED Final   Barbiturates 06/12/2023 NONE DETECTED  NONE DETECTED Final   Comment: (NOTE) DRUG SCREEN FOR MEDICAL PURPOSES ONLY.  IF CONFIRMATION IS NEEDED FOR ANY PURPOSE, NOTIFY LAB WITHIN 5 DAYS.  LOWEST DETECTABLE LIMITS FOR URINE DRUG SCREEN Drug Class                     Cutoff (ng/mL) Amphetamine and metabolites    1000 Barbiturate and metabolites    200 Benzodiazepine                 200 Opiates and metabolites        300 Cocaine and metabolites        300 THC                            50 Performed at [facility], [address] [place], [place] [address]    WBC 06/12/2023 12.4 (H)  4.0 - 10.5 K/uL Final   RBC 06/12/2023 4.62  3.87 - 5.11 MIL/uL Final   Hemoglobin 06/12/2023 14.8  12.0 - 15.0 g/dL Final   HCT 40/01/2724 42.0  36.0 - 46.0 % Final   MCV 06/12/2023 90.9  80.0 - 100.0 fL Final   MCH 06/12/2023 32.0  26.0 - 34.0 pg Final   MCHC 06/12/2023 35.2  30.0 - 36.0 g/dL Final   RDW 36/64/4034 12.8  11.5 - 15.5 % Final    Platelets 06/12/2023 295  150 - 400 K/uL Final   nRBC 06/12/2023 0.0  0.0 - 0.2 % Final   Neutrophils Relative % 06/12/2023 76  % Final   Neutro Abs 06/12/2023 9.4 (H)  1.7 - 7.7  K/uL Final   Lymphocytes Relative 06/12/2023 13  % Final   Lymphs Abs 06/12/2023 1.7  0.7 - 4.0 K/uL Final   Monocytes Relative 06/12/2023 7  % Final   Monocytes Absolute 06/12/2023 0.8  0.1 - 1.0 K/uL Final   Eosinophils Relative 06/12/2023 3  % Final   Eosinophils Absolute 06/12/2023 0.4  0.0 - 0.5 K/uL Final   Basophils Relative 06/12/2023 1  % Final   Basophils Absolute 06/12/2023 0.1  0.0 - 0.1 K/uL Final   Immature Granulocytes 06/12/2023 0  % Final   Abs Immature Granulocytes 06/12/2023 0.03  0.00 - 0.07 K/uL Final   Performed at [facility], [address], [place], [place] [address]   Magnesium 06/12/2023 2.1  1.7 - 2.4 mg/dL Final   Performed at [facility] [place] [facility], [address], [place], [place] [address]    Allergies: Patient has no known allergies.  Medications:  Facility Ordered Medications  Medication   hydrOXYzine (ATARAX/VISTARIL) tablet 50 mg   gabapentin (NEURONTIN) tablet 300 mg   cloNIDine (CATAPRES) tablet 0.1 mg   Followed by   Melene Muller ON 06/15/2023] cloNIDine (CATAPRES) tablet 0.1 mg   Followed by   Melene Muller ON 06/17/2023] cloNIDine (CATAPRES) tablet 0.1 mg   dicyclomine (BENTYL) capsule 20 mg   hydrOXYzine (ATARAX) tablet 25 mg   loperamide (IMODIUM) capsule 2-4 mg   methocarbamol (ROBAXIN) tablet 500 mg   naproxen (NAPROSYN) tablet 500 mg   nicotine (NICODERM CQ - dosed in mg/24 hours) patch 21 mg   ondansetron (ZOFRAN-ODT) disintegrating tablet 4 mg   acetaminophen (TYLENOL) tablet 650 mg   alum & mag hydroxide-simeth (MAALOX/MYLANTA) 200-200-20 MG/5ML suspension 30 mL   magnesium hydroxide (MILK OF MAGNESIA) suspension 30 mL   LORazepam (ATIVAN) tablet 1 mg   Or   LORazepam (ATIVAN) injection 2 mg   haloperidol lactate (HALDOL) injection 5 mg   And   diphenhydrAMINE (BENADRYL)  injection 50 mg   And   LORazepam (ATIVAN) injection 2 mg   haloperidol (HALDOL) tablet 5 mg   And   diphenhydrAMINE (BENADRYL) capsule 50 mg   [COMPLETED] haloperidol lactate (HALDOL) injection 5 mg   [COMPLETED] LORazepam (ATIVAN) injection 2 mg   [COMPLETED] diphenhydrAMINE (BENADRYL) injection 50 mg   QUEtiapine (SEROQUEL) tablet 50 mg   PTA Medications  Medication Sig   diphenhydrAMINE (BENADRYL) 25 MG tablet Take 50 mg by mouth every 6 (six) hours as needed for allergies.   loperamide (IMODIUM) 2 MG capsule Take 4 mg by mouth as needed for diarrhea or loose stools.   acetaminophen (TYLENOL) 500 MG tablet Take 1,500 mg by mouth every 6 (six) hours as needed for mild pain (pain score 1-3).    Long Term Goals: Improvement in symptoms so as ready for discharge  Short Term Goals: Patient will verbalize feelings in meetings with treatment team members., Patient will attend at least of 50% of the groups daily., Pt will complete the PHQ9 on admission, day 3 and discharge., Patient will participate in completing the [place] Suicide Severity Rating Scale, Patient will score a low risk of violence for 24 hours prior to discharge, and Patient will take medications as prescribed daily.  Chronic suicide risk factors prior attempts, traumatic past, stressful life events, mental illness, chronic medical conditions and unemployment Acute suicide risk factors aggression , non-adherence to care , impulsivity, mood and personality changes , substance use and hopeless Suicide protective factors Willingness to seek help, religion/spiritual, aware of resources  Given above risks versus protective  factors patients current acute risk for harm to self at this time is mitigated; but elevated in term of substance abuse. Given above risks versus protective factors patients current chronic risk for harm to self at this time is low to moderate in context of ongoing substance use, but mitigated in terms of  intentional imminent self-harm/suicide  Violence/assault/Homicide Risk Assessment: No risk factors. Protective factors include no identified conflict with others. In addition patient makes no homicidal statements. No history of assault. Patient is low risk of imminent harm to others/homicide.   Medical Decision Making    The patient presents with a history of polysubstance abuse (fentanyl), bipolar disorder, depression, and fibromyalgia, reporting suicidal thoughts during withdrawal but denying active intent or plan at this time. She sought help voluntarily after experiencing severe withdrawal symptoms (hot/cold chills, agitation, burning skin sensation, rhinitis, and muscle cramps) and realizing she could not detox on her own. She reports past attempts at sobriety were forced, but now she is motivated to recover for herself and to care for her husband, who has CHF. She endorses depression, anhedonia, amotivation, restlessness, and decreased sleep (2-3 hrs/24 hrs) but denies psychosis, recent manic episodes, or self-harm behaviors. Given her withdrawal symptoms and high relapse risk, ongoing detox and inpatient rehabilitation are recommended. IVC may be considered if her condition worsens. She will resume psychiatric medications as indicated and receive continued monitoring.  Recommendations  Based on my evaluation the patient does not appear to have an emergency medical condition. Will admit to [facility]  Patient would be a great candidate for buprenorphine induction however has declined. It is important to remember that weaning off or tapering a patient who is not interested in detox and/or plans to continue to use upon discharge has been proven to be more dangerous and at times lethal opoid overdose. So in this case not tapering or keeping their tolerance will be a protective factor for this patient.   -Will start clonidine protocol -Currently meets IVC critieria in the event she attempts to leave.   -Resume home medications. -Start COWS protocol   Maryagnes Amos, FNP 06/14/23  7:37 PM

## 2023-06-14 NOTE — Group Note (Signed)
Group Topic: Communication  Group Date: 06/14/2023 Start Time: 2200 End Time: 2300 Facilitators: Rae Lips B  Department: [facility]  Number of Participants: 5  Group Focus: check in, communication, and daily focus Treatment Modality:  Individual Therapy Interventions utilized were exploration, leisure development, problem solving, story telling, and support Purpose: express feelings and increase insight  Name: Victoria Galvan Date of Birth: Sep 17, 1970  MR: [id number]    Level of Participation: PT DID NOT ATTEND GROUP  Quality of Participation: cooperative Interactions with others: gave feedback Mood/Affect: appropriate Triggers (if applicable): NA Cognition: coherent/clear Progress: None Response: NA Plan: patient will be encouraged to go to groups.   Patients Problems:  Patient Active Problem List   Diagnosis Date Noted   Fentanyl use disorder, severe (HCC) 06/13/2023   Mixed connective tissue disease (HCC) 06/13/2023   Substance induced mood disorder (HCC) 06/13/2023   High triglycerides 03/21/2017   Tobacco use disorder 03/21/2017   Chronic pain syndrome 03/21/2017   Major depressive disorder, recurrent, severe with psychotic features (HCC) 03/12/2017   Suicidal ideation    MDD (major depressive disorder), recurrent episode, severe (HCC) 05/03/2016   GAD (generalized anxiety disorder) 05/13/2015   Myalgia 08/07/2014   Positive ANA (antinuclear antibody) 08/07/2014   Hypothyroidism 08/07/2014

## 2023-06-14 NOTE — Group Note (Signed)
Group Topic: Relapse and Recovery  Group Date: 06/14/2023 Start Time: 1245 End Time: 1330 Facilitators: Jenean Lindau, RN  Department: [facility]  Number of Participants: 10  Group Focus: acceptance, affirmation, chemical dependency education, chemical dependency issues, clarity of thought, and coping skills Treatment Modality:  Behavior Modification Therapy Interventions utilized were exploration, patient education, problem solving, and reality testing Purpose: enhance coping skills, explore maladaptive thinking, express feelings, express irrational fears, improve communication skills, increase insight, regain self-worth, reinforce self-care, and relapse prevention strategies  Name: Victoria Galvan Date of Birth: [birth year]  MR: [id number]    Level of Participation:  Did not attend Quality of Participation: withdrawn Interactions with others:   Mood/Affect: closed / guarded Triggers (if applicable):   Cognition:   Progress: Minimal Response:   Plan: follow-up needed  Patients Problems:  Patient Active Problem List   Diagnosis Date Noted   Fentanyl use disorder, severe (HCC) 06/13/2023   Mixed connective tissue disease (HCC) 06/13/2023   Substance induced mood disorder (HCC) 06/13/2023   High triglycerides 03/21/2017   Tobacco use disorder 03/21/2017   Chronic pain syndrome 03/21/2017   Major depressive disorder, recurrent, severe with psychotic features (HCC) 03/12/2017   Suicidal ideation    MDD (major depressive disorder), recurrent episode, severe (HCC) 05/03/2016   GAD (generalized anxiety disorder) 05/13/2015   Myalgia 08/07/2014   Positive ANA (antinuclear antibody) 08/07/2014   Hypothyroidism 08/07/2014

## 2023-06-14 NOTE — ED Notes (Signed)
Patient restless in bed with eyes open, see MAR for meds given for crappy, aching, nausea, and diarrhea,  respirations equal and unlabored, skin warm and dry, NAD. Routine safety checks conducted according to facility protocol. Will continue to monitor for safety.

## 2023-06-14 NOTE — Group Note (Signed)
Group Topic: Overcoming Obstacles  Group Date: 06/14/2023 Start Time: 1217 End Time: 1245 Facilitators: Vonzell Schlatter B  Department: [facility]  Number of Participants: 7  Group Focus: communication and daily focus Treatment Modality:  Psychoeducation Interventions utilized were patient education and problem solving Purpose: increase insight and reinforce self-care  Name: SHAUNTELLE JAMERSON Date of Birth: 02-07-1971  MR: [id number]    Level of Participation: Pt did not attend group Quality of Participation:  Interactions with others:  Mood/Affect:  Triggers (if applicable):  Cognition:  Progress:  Response:  Plan: follow-up needed  Patients Problems:  Patient Active Problem List   Diagnosis Date Noted   Fentanyl use disorder, severe (HCC) 06/13/2023   Mixed connective tissue disease (HCC) 06/13/2023   Substance induced mood disorder (HCC) 06/13/2023   High triglycerides 03/21/2017   Tobacco use disorder 03/21/2017   Chronic pain syndrome 03/21/2017   Major depressive disorder, recurrent, severe with psychotic features (HCC) 03/12/2017   Suicidal ideation    MDD (major depressive disorder), recurrent episode, severe (HCC) 05/03/2016   GAD (generalized anxiety disorder) 05/13/2015   Myalgia 08/07/2014   Positive ANA (antinuclear antibody) 08/07/2014   Hypothyroidism 08/07/2014

## 2023-06-14 NOTE — ED Notes (Signed)
 Patient is sleeping. Respirations equal and unlabored, skin warm and dry. No change in assessment or acuity. Routine safety checks conducted according to facility protocol. Will continue to monitor for safety.

## 2023-06-14 NOTE — ED Notes (Signed)
MHT tried to wake up pt for vitals and pt laid there ignoring me. Will try again in a few.

## 2023-06-14 NOTE — ED Notes (Addendum)
Pt is in her room resting in bed. Pt denies SI/HI/AVH.Pt answering assessment questions reluctantly. No acute distress noted. Will continue to monitor for safety.

## 2023-06-14 NOTE — ED Notes (Signed)
 Patient resting quietly in bed with eyes closed. Respirations equal and unlabored, skin warm and dry, NAD. Routine safety checks conducted according to facility protocol. Will continue to monitor for safety.

## 2023-06-14 NOTE — ED Notes (Signed)
Patient asked for discharge however provider Dr. Woodroe Mode was in the process of IVCing patient.  Patient had expressed suicidal ideation to provider earlier in the AM.  Stating that "death is the best way."  Provider offered patient IM injections of 2mg  ativan, 5mg  haldol and 50mg  benadryl which she accepted.  Patient was upset about being ivc'd and about not knowing where here husband who recently had a stroke and according to patient "like a child."  Patient was given verbal support and RN got her phone so she could make calls to check on him.  Patient is now lying in bed.  She is calm and without distress or agitation.  Will monitor and provide a safe environment.

## 2023-06-14 NOTE — ED Notes (Signed)
MHT has asked pt to keep door open so we can do rounds. Pt has shut the door every 15 mins after I do a round.

## 2023-06-15 DIAGNOSIS — F1193 Opioid use, unspecified with withdrawal: Secondary | ICD-10-CM

## 2023-06-15 MED ORDER — QUETIAPINE FUMARATE 25 MG PO TABS
25.0000 mg | ORAL_TABLET | Freq: Every day | ORAL | Status: DC
  Administered 2023-06-15 – 2023-06-16 (×2): 25 mg via ORAL
  Filled 2023-06-15 (×2): qty 1

## 2023-06-15 MED ORDER — ONDANSETRON 4 MG PO TBDP
4.0000 mg | ORAL_TABLET | Freq: Once | ORAL | Status: AC
  Administered 2023-06-15: 4 mg via ORAL
  Filled 2023-06-15: qty 1

## 2023-06-15 MED ORDER — LORAZEPAM 1 MG PO TABS
1.0000 mg | ORAL_TABLET | Freq: Once | ORAL | Status: AC | PRN
  Administered 2023-06-15: 1 mg via SUBLINGUAL
  Filled 2023-06-15: qty 1

## 2023-06-15 MED ORDER — QUETIAPINE FUMARATE 25 MG PO TABS: 25.0000 mg | ORAL_TABLET | Freq: Four times a day (QID) | ORAL | Status: DC | PRN

## 2023-06-15 MED ORDER — MELATONIN 3 MG PO TABS
3.0000 mg | ORAL_TABLET | Freq: Every evening | ORAL | Status: DC | PRN
  Administered 2023-06-15 – 2023-06-16 (×3): 3 mg via ORAL
  Filled 2023-06-15 (×3): qty 1

## 2023-06-15 MED ORDER — POTASSIUM CHLORIDE CRYS ER 10 MEQ PO TBCR
10.0000 meq | EXTENDED_RELEASE_TABLET | Freq: Every day | ORAL | Status: DC
  Administered 2023-06-15: 10 meq via ORAL
  Filled 2023-06-15 (×2): qty 1

## 2023-06-15 NOTE — Group Note (Signed)
Group Topic: Fears and Unhealthy Coping Skills  Group Date: 06/15/2023 Start Time: 2100 End Time: 2200 Facilitators: Rae Lips B  Department: [facility]  Number of Participants: 4  Group Focus: abuse issues, acceptance, activities of daily living skills, anxiety, chemical dependency education, chemical dependency issues, clarity of thought, co-dependency, communication, community group, coping skills, daily focus, depression, diagnosis education, dual diagnosis, family, and feeling awareness/expression Treatment Modality:  Individual Therapy, Leisure Counsellor, Psychoeducation, Optician, dispensing, Solution-Focused Therapy, and Spiritual Interventions utilized were leisure development, patient education, problem solving, story telling, and support Purpose: express feelings, express irrational fears, improve communication skills, increase insight, and regain self-worth  Name: Victoria Galvan Date of Birth: 27-Feb-1971  MR: [id number]    Level of Participation: withdrawn Quality of Participation: cooperative Interactions with others: gave feedback Mood/Affect: appropriate Triggers (if applicable): NA Cognition: coherent/clear Progress: None Response: NA Plan: patient will be encouraged to go to groups.   Patients Problems:  Patient Active Problem List   Diagnosis Date Noted   Fentanyl use disorder, severe (HCC) 06/13/2023   Mixed connective tissue disease (HCC) 06/13/2023   Substance induced mood disorder (HCC) 06/13/2023   High triglycerides 03/21/2017   Tobacco use disorder 03/21/2017   Chronic pain syndrome 03/21/2017   Major depressive disorder, recurrent, severe with psychotic features (HCC) 03/12/2017   Suicidal ideation    MDD (major depressive disorder), recurrent episode, severe (HCC) 05/03/2016   GAD (generalized anxiety disorder) 05/13/2015   Myalgia 08/07/2014   Positive ANA (antinuclear antibody) 08/07/2014   Hypothyroidism 08/07/2014

## 2023-06-15 NOTE — ED Provider Notes (Signed)
Facility Based Crisis Admission H&P  Date: 06/15/23 Patient Name: Victoria Galvan MRN: [id number] Chief Complaint: "I am in a lot of pain".  Diagnoses:  Final diagnoses:  Opioid use with withdrawal (HCC)   Victoria Galvan is a 53 y.o. female with a history of polysubstance abuse (fentanyl), bipolar disorder, depression, and fibromyalgia but is not on any medications due to loss of insurance.  Patient initially presented to the ED for opioid withdrawal and suicidal thoughts after attempting to detox on her own. And now seeks treatment voluntarily, stating, "I need to get clean for myself and my husband, who has CHF."   Patient seen today in bed.  She makes minimal eye contact, and describes depression and anxiety related to being in so much pain.  Depression and anxiety rated at 8/10 with 10 being most severe.  Withdrawal symptoms include hot/cold chills, agitation, burning skin sensation "skin feels like it is on fire", rhinitis, and muscle cramps. She endorses depression, anhedonia, amotivation, and restlessness.  She reports it has been difficult to sleep due to the pain, but she is attempting to sleep to avoid.  She states that she is "not hungry".  She denies psychosis, recent mania, or self-harm behaviors.  She presents with irritable mood that was congruent with her affect.   PHQ 2-9:  Flowsheet Row ED from 06/13/2023 in [facility]  Thoughts that you would be better off dead, or of hurting yourself in some way Nearly every day  PHQ-9 Total Score 18       Flowsheet Row ED from 06/13/2023 in [facility] ED from 06/12/2023 in [facility] Department at [facility] ED from 06/06/2023 in [facility] Emergency Department at [facility]  C-SSRS RISK CATEGORY High Risk High Risk No Risk       Screenings    Flowsheet Row Most Recent Value  CIWA-Ar Total 10  COWS Total Score 6      Total Time Spent in Direct  Patient Care:  I personally spent 35 minutes on the unit in direct patient care. The direct patient care time included face-to-face time with the patient, reviewing the patient's chart, communicating with other professionals, and coordinating care. Greater than 50% of this time was spent in counseling or coordinating care with the patient regarding goals of hospitalization, psycho-education, and discharge planning needs.   Musculoskeletal  Strength & Muscle Tone: within normal limits Gait & Station: normal Patient leans: N/A  Psychiatric Specialty Exam  Presentation General Appearance: Appropriate for Environment; Casual  Eye Contact:Minimal  Speech:Garbled; Normal Rate  Speech Volume:Decreased  Handedness:Right   Mood and Affect  Mood:Irritable; Depressed; Anxious  Affect:Congruent   Thought Process  Thought Processes:Coherent; Goal Directed  Descriptions of Associations:Intact  Orientation:Full (Time, Place and Person)  Thought Content:Logical    Hallucinations:Hallucinations: None  Ideas of Reference:None  Suicidal Thoughts:Suicidal Thoughts: Yes, Passive SI Passive Intent and/or Plan: Without Intent; Without Plan; Without Means to Carry Out; Without Access to Means  Homicidal Thoughts:Homicidal Thoughts: No   Sensorium  Memory:Immediate Good; Recent Good; Remote Good  Judgment:Fair  Insight:Fair   Executive Functions  Concentration:Poor  Attention Span:Poor  Recall:Fair  Fund of Knowledge:Fair  Language:Good   Psychomotor Activity  Psychomotor Activity: Decreased  Assets  Assets:Resilience, Housing, Intimacy, Victoria Galvan, desire for improvement, social support  Sleep  Sleep: poor    Physical Exam Vitals and nursing note reviewed.  Constitutional:      Appearance: Normal appearance. She  is normal weight.  HENT:     Head: Normocephalic.     Nose: No congestion.  Cardiovascular:     Rate and Rhythm: Normal rate.  Pulmonary:      Effort: Pulmonary effort is normal. No respiratory distress.  Neurological:     General: No focal deficit present.     Mental Status: She is alert and oriented to person, place, and time.  Psychiatric:        Attention and Perception: Attention and perception normal.        Mood and Affect: Mood is anxious and depressed.        Speech: Speech normal.        Behavior: Behavior is agitated. Behavior is cooperative.        Thought Content: Thought content includes suicidal (passive) ideation.        Cognition and Memory: Cognition and memory normal.    Review of Systems  Constitutional:  Positive for chills and malaise/fatigue.  HENT: Negative.    Respiratory:  Negative for shortness of breath.   Cardiovascular:  Negative for chest pain and palpitations.  Gastrointestinal:  Positive for abdominal pain, diarrhea and nausea.  Musculoskeletal:  Positive for myalgias.  Neurological:  Positive for sensory change.  Psychiatric/Behavioral:  Positive for depression, substance abuse and suicidal ideas (passive). Negative for hallucinations. The patient is nervous/anxious and has insomnia.   All other systems reviewed and are negative.   Blood pressure 119/87, pulse 97, temperature 97.9 F (36.6 C), temperature source Oral, resp. rate 16, SpO2 99%. There is no height or weight on file to calculate BMI.  Past Psychiatric History:  MDD, Opioid Use DO  Is the patient at risk to self? Yes  Has the patient been a risk to self in the past 6 months? Yes .    Has the patient been a risk to self within the distant past? No   Is the patient a risk to others? No   Has the patient been a risk to others in the past 6 months? No   Has the patient been a risk to others within the distant past? No   Past Medical History: fibromyalgia Social History: married, unemployed  Last Labs:  Admission on 06/12/2023, Discharged on 06/13/2023  Component Date Value Ref Range Status   Sodium 06/12/2023 136  135 -  145 mmol/L Final   Potassium 06/12/2023 3.1 (L)  3.5 - 5.1 mmol/L Final   Chloride 06/12/2023 101  98 - 111 mmol/L Final   CO2 06/12/2023 25  22 - 32 mmol/L Final   Glucose, Bld 06/12/2023 99  70 - 99 mg/dL Final   Glucose reference range applies only to samples taken after fasting for at least 8 hours.   BUN 06/12/2023 8  6 - 20 mg/dL Final   Creatinine, Ser 06/12/2023 0.80  0.44 - 1.00 mg/dL Final   Calcium 95/62/1308 9.1  8.9 - 10.3 mg/dL Final   Total Protein 65/78/4696 7.4  6.5 - 8.1 g/dL Final   Albumin 29/52/8413 4.3  3.5 - 5.0 g/dL Final   AST 24/40/1027 25  15 - 41 U/L Final   ALT 06/12/2023 20  0 - 44 U/L Final   Alkaline Phosphatase 06/12/2023 118  38 - 126 U/L Final   Total Bilirubin 06/12/2023 0.6  0.0 - 1.2 mg/dL Final   GFR, Estimated 06/12/2023 >60  >60 mL/min Final   Comment: (NOTE) Calculated using the CKD-EPI Creatinine Equation (2021)    Anion gap 06/12/2023  10  5 - 15 Final   Performed at [facility], [address], [place], [place] [address]   Alcohol, Ethyl (B) 06/12/2023 <10  <10 mg/dL Final   Comment: (NOTE) Lowest detectable limit for serum alcohol is 10 mg/dL.  For medical purposes only. Performed at [facility], [address] [place], [place] [address]    Opiates 06/12/2023 NONE DETECTED  NONE DETECTED Final   Cocaine 06/12/2023 NONE DETECTED  NONE DETECTED Final   Benzodiazepines 06/12/2023 POSITIVE (A)  NONE DETECTED Final   Amphetamines 06/12/2023 NONE DETECTED  NONE DETECTED Final   Tetrahydrocannabinol 06/12/2023 NONE DETECTED  NONE DETECTED Final   Barbiturates 06/12/2023 NONE DETECTED  NONE DETECTED Final   Comment: (NOTE) DRUG SCREEN FOR MEDICAL PURPOSES ONLY.  IF CONFIRMATION IS NEEDED FOR ANY PURPOSE, NOTIFY LAB WITHIN 5 DAYS.  LOWEST DETECTABLE LIMITS FOR URINE DRUG SCREEN Drug Class                     Cutoff (ng/mL) Amphetamine and metabolites    1000 Barbiturate and metabolites    200 Benzodiazepine                 200 Opiates  and metabolites        300 Cocaine and metabolites        300 THC                            50 Performed at [facility], [address] [place], [place] [address]    WBC 06/12/2023 12.4 (H)  4.0 - 10.5 K/uL Final   RBC 06/12/2023 4.62  3.87 - 5.11 MIL/uL Final   Hemoglobin 06/12/2023 14.8  12.0 - 15.0 g/dL Final   HCT 91/47/8295 42.0  36.0 - 46.0 % Final   MCV 06/12/2023 90.9  80.0 - 100.0 fL Final   MCH 06/12/2023 32.0  26.0 - 34.0 pg Final   MCHC 06/12/2023 35.2  30.0 - 36.0 g/dL Final   RDW 62/13/0865 12.8  11.5 - 15.5 % Final   Platelets 06/12/2023 295  150 - 400 K/uL Final   nRBC 06/12/2023 0.0  0.0 - 0.2 % Final   Neutrophils Relative % 06/12/2023 76  % Final   Neutro Abs 06/12/2023 9.4 (H)  1.7 - 7.7 K/uL Final   Lymphocytes Relative 06/12/2023 13  % Final   Lymphs Abs 06/12/2023 1.7  0.7 - 4.0 K/uL Final   Monocytes Relative 06/12/2023 7  % Final   Monocytes Absolute 06/12/2023 0.8  0.1 - 1.0 K/uL Final   Eosinophils Relative 06/12/2023 3  % Final   Eosinophils Absolute 06/12/2023 0.4  0.0 - 0.5 K/uL Final   Basophils Relative 06/12/2023 1  % Final   Basophils Absolute 06/12/2023 0.1  0.0 - 0.1 K/uL Final   Immature Granulocytes 06/12/2023 0  % Final   Abs Immature Granulocytes 06/12/2023 0.03  0.00 - 0.07 K/uL Final   Performed at [facility], [address] [place], [place] [address]   Magnesium 06/12/2023 2.1  1.7 - 2.4 mg/dL Final   Performed at [facility], [address], [place], [place] [address]    Allergies: Patient has no known allergies.  Medications:  Facility Ordered Medications  Medication   hydrOXYzine (ATARAX/VISTARIL) tablet 50 mg   gabapentin (NEURONTIN) tablet 300 mg   [EXPIRED] cloNIDine (CATAPRES) tablet 0.1 mg   Followed by   cloNIDine (CATAPRES) tablet 0.1 mg  Followed by   Melene Muller ON 06/17/2023] cloNIDine (CATAPRES) tablet 0.1 mg   dicyclomine (BENTYL) capsule 20 mg   hydrOXYzine (ATARAX) tablet 25 mg   loperamide (IMODIUM) capsule 2-4  mg   methocarbamol (ROBAXIN) tablet 500 mg   naproxen (NAPROSYN) tablet 500 mg   nicotine (NICODERM CQ - dosed in mg/24 hours) patch 21 mg   ondansetron (ZOFRAN-ODT) disintegrating tablet 4 mg   acetaminophen (TYLENOL) tablet 650 mg   alum & mag hydroxide-simeth (MAALOX/MYLANTA) 200-200-20 MG/5ML suspension 30 mL   magnesium hydroxide (MILK OF MAGNESIA) suspension 30 mL   LORazepam (ATIVAN) tablet 1 mg   Or   LORazepam (ATIVAN) injection 2 mg   haloperidol lactate (HALDOL) injection 5 mg   And   diphenhydrAMINE (BENADRYL) injection 50 mg   And   LORazepam (ATIVAN) injection 2 mg   haloperidol (HALDOL) tablet 5 mg   And   diphenhydrAMINE (BENADRYL) capsule 50 mg   [COMPLETED] haloperidol lactate (HALDOL) injection 5 mg   [COMPLETED] LORazepam (ATIVAN) injection 2 mg   [COMPLETED] diphenhydrAMINE (BENADRYL) injection 50 mg   QUEtiapine (SEROQUEL) tablet 50 mg   melatonin tablet 3 mg   QUEtiapine (SEROQUEL) tablet 25 mg   QUEtiapine (SEROQUEL) tablet 25 mg   potassium chloride (KLOR-CON M) CR tablet 10 mEq   [COMPLETED] ondansetron (ZOFRAN-ODT) disintegrating tablet 4 mg   PTA Medications  Medication Sig   diphenhydrAMINE (BENADRYL) 25 MG tablet Take 50 mg by mouth every 6 (six) hours as needed for allergies.   loperamide (IMODIUM) 2 MG capsule Take 4 mg by mouth as needed for diarrhea or loose stools.   acetaminophen (TYLENOL) 500 MG tablet Take 1,500 mg by mouth every 6 (six) hours as needed for mild pain (pain score 1-3).    Medical Decision Making    The patient presents with a history of polysubstance abuse (fentanyl), bipolar disorder, depression, and fibromyalgia, reporting suicidal thoughts during withdrawal but denying active intent or plan at this time. She sought help voluntarily after experiencing severe withdrawal symptoms (hot/cold chills, agitation, burning skin sensation, rhinitis, and muscle cramps) and realizing she could not detox on her own. She reports past  attempts at sobriety were forced, but now she is motivated to recover for herself and to care for her husband, who has CHF. She endorses depression, anhedonia, amotivation, restlessness, and decreased sleep (2-3 hrs/24 hrs) but denies psychosis, recent manic episodes, or self-harm behaviors. Given her withdrawal symptoms and high relapse risk, ongoing detox and inpatient rehabilitation are recommended. IVC may be considered if her condition worsens. She will resume psychiatric medications as indicated and receive continued monitoring.  Recommendations   Treatment Plan Summary: Daily contact with patient to assess and evaluate symptoms and progress in treatment, Medication management, and Plan COWS protocol -Clonidine taper with blood pressure parameters -Tylenol 650 mg every 6 hours as needed for pain -Zofran 4 mg every 6 hours as needed for nausea or vomiting -Imodium 2 to 4 mg as needed for diarrhea or loose stools  -Maalox/Mylanta 30 mL every 4 hours as needed for indigestion -Milk of Mag 30 mL as needed for constipation  -Bentyl 20 mg every 6 hours as needed for abdominal cramping -Hydroxyzine 25 mg every 6 hours as needed for itching -Robaxin 500 mg every 8 hours as needed for muscle spasm -Naproxen 500 mg twice daily as needed for aching pain or discomfort -Seroquel 25 mg night and 50 mg at night for anxiety: Patient has an additional Seroquel 25 mg as needed  every 6 hours  -Melatonin 3 mg daily at bedtime as needed for sleep -NicoDerm CQ 21 mg daily for nicotine replacement treatment  -Agitation protocol in place  Medical comorbidities: Labs reviewed, patient has mild hypokalemia (3.1 ( -Start K-Dur 10 milliequivalents daily with recheck BMP on 06/17/2023  Discharge to be determined.     Mariel Craft, MD 06/15/23  6:34 PM

## 2023-06-15 NOTE — ED Notes (Signed)
Patient A&Ox4. Denies intent/thoughts to harm self/others when asked. Denies A/VH. Patient reports 5/10 pain to bilateral legs when asked. No acute distress noted. Support and encouragement provided. Routine safety checks conducted according to facility protocol. Encouraged patient to notify staff if thoughts of harm toward self or others arise. Endorses safety. Patient verbalized understanding and agreement. Will continue to monitor for safety.

## 2023-06-15 NOTE — Group Note (Signed)
Group Topic: Social Support  Group Date: 06/15/2023 Start Time: 1000 End Time: 1045 Facilitators: Loyce Dys, NT  Department: [facility]  Number of Participants: 2  Group Focus: goals/reality orientation Treatment Modality:  Patient-Centered Therapy Interventions utilized were support Purpose: relapse prevention strategies  Name: Victoria Galvan Date of Birth: [birth year]  MR: [id number]    Level of Participation: Did not attend  Quality of Participation:  Interactions with others:  Mood/Affect:  Triggers (if applicable):  Cognition:  Progress:  Response:  Plan: follow-up needed  Patients Problems:  Patient Active Problem List   Diagnosis Date Noted   Fentanyl use disorder, severe (HCC) 06/13/2023   Mixed connective tissue disease (HCC) 06/13/2023   Substance induced mood disorder (HCC) 06/13/2023   High triglycerides 03/21/2017   Tobacco use disorder 03/21/2017   Chronic pain syndrome 03/21/2017   Major depressive disorder, recurrent, severe with psychotic features (HCC) 03/12/2017   Suicidal ideation    MDD (major depressive disorder), recurrent episode, severe (HCC) 05/03/2016   GAD (generalized anxiety disorder) 05/13/2015   Myalgia 08/07/2014   Positive ANA (antinuclear antibody) 08/07/2014   Hypothyroidism 08/07/2014

## 2023-06-15 NOTE — ED Notes (Signed)
 Patient is sleeping. Respirations equal and unlabored, skin warm and dry. No change in assessment or acuity. Routine safety checks conducted according to facility protocol. Will continue to monitor for safety.

## 2023-06-15 NOTE — Group Note (Signed)
Group Topic: Relapse and Recovery  Group Date: 06/15/2023 Start Time: 1430 End Time: 1530 Facilitators: Gardiner Barefoot, RN  Department: [facility]  Number of Participants: 10  Group Focus: anxiety, chemical dependency education, and chemical dependency issues Treatment Modality:  Patient-Centered Therapy Interventions utilized were confrontation, exploration, patient education, and problem solving Purpose: enhance coping skills, explore maladaptive thinking, express feelings, express irrational fears, improve communication skills, increase insight, regain self-worth, reinforce self-care, relapse prevention strategies, and trigger / craving management  Name: Victoria Galvan Date of Birth: 1970/10/15  MR: [id number]    Level of Participation: Did not attend Quality of Participation:  Interactions with others:  Mood/Affect:  Triggers (if applicable):  Cognition:  Progress: None Response:  Plan:   Patients Problems:  Patient Active Problem List   Diagnosis Date Noted   Fentanyl use disorder, severe (HCC) 06/13/2023   Mixed connective tissue disease (HCC) 06/13/2023   Substance induced mood disorder (HCC) 06/13/2023   High triglycerides 03/21/2017   Tobacco use disorder 03/21/2017   Chronic pain syndrome 03/21/2017   Major depressive disorder, recurrent, severe with psychotic features (HCC) 03/12/2017   Suicidal ideation    MDD (major depressive disorder), recurrent episode, severe (HCC) 05/03/2016   GAD (generalized anxiety disorder) 05/13/2015   Myalgia 08/07/2014   Positive ANA (antinuclear antibody) 08/07/2014   Hypothyroidism 08/07/2014

## 2023-06-15 NOTE — [facility] Group Notes (Addendum)
SPIRITUALITY GROUP NOTE  Pt attended spirituality group facilitated by Chaplain Kamden Stanislaw, MDIv, BCC.  Group Description:  Group focused on topic of hope.  Patients participated in facilitated discussion around topic, connecting with one another around experiences and definitions for hope.  Group members engaged with visual explorer photos, reflecting on what hope looks like for them today.  Group engaged in discussion around how their definitions of hope are present today in hospital.   Modalities: Psycho-social ed, Adlerian, Narrative, MI Patient Progress:  DID NOT ATTEND 

## 2023-06-16 MED ORDER — QUETIAPINE FUMARATE 50 MG PO TABS
50.0000 mg | ORAL_TABLET | Freq: Every day | ORAL | Status: DC
  Administered 2023-06-17: 50 mg via ORAL
  Filled 2023-06-16: qty 1

## 2023-06-16 MED ORDER — QUETIAPINE FUMARATE 25 MG PO TABS
25.0000 mg | ORAL_TABLET | Freq: Once | ORAL | Status: AC
  Administered 2023-06-16: 25 mg via ORAL
  Filled 2023-06-16: qty 1

## 2023-06-16 MED ORDER — QUETIAPINE FUMARATE 100 MG PO TABS
100.0000 mg | ORAL_TABLET | Freq: Every day | ORAL | Status: DC
  Administered 2023-06-16: 100 mg via ORAL
  Filled 2023-06-16: qty 1

## 2023-06-16 MED ORDER — POTASSIUM CHLORIDE CRYS ER 10 MEQ PO TBCR
10.0000 meq | EXTENDED_RELEASE_TABLET | Freq: Every day | ORAL | Status: AC
  Administered 2023-06-16: 10 meq via ORAL

## 2023-06-16 NOTE — ED Notes (Signed)
 VSS, she continues to voice c/o dehydration. Offered PRN med, she declines and states she wants to be sent out. Provider notified of request.

## 2023-06-16 NOTE — ED Notes (Signed)
Pt observed/assessed in room sleeping. RR even and unlabored, appearing in no noted distress. Environmental check complete, will continue to monitor for safety 

## 2023-06-16 NOTE — Group Note (Signed)
 Group Topic: Relapse and Recovery  Group Date: 06/16/2023 Start Time: 2000 End Time: 2100 Facilitators: Rae Lips B  Department: [facility]  Number of Participants: 3  Group Focus: abuse issues, activities of daily living skills, chemical dependency education, and chemical dependency issues Treatment Modality:  Psychoeducation Interventions utilized were leisure development, patient education, problem solving, story telling, and support Purpose: enhance coping skills and relapse prevention strategies  Name: Victoria Galvan Date of Birth: [birth year]  MR: [id number]    Level of Participation: active Quality of Participation: attentive, cooperative, and offered feedback Interactions with others: gave feedback Mood/Affect: appropriate, brightens with interaction, and positive Triggers (if applicable): NA Cognition: coherent/clear Progress: Gaining insight Response: NA Plan: patient will be encouraged to keep going to groups.   Patients Problems:  Patient Active Problem List   Diagnosis Date Noted   Fentanyl use disorder, severe (HCC) 06/13/2023   Mixed connective tissue disease (HCC) 06/13/2023   Substance induced mood disorder (HCC) 06/13/2023   High triglycerides 03/21/2017   Tobacco use disorder 03/21/2017   Chronic pain syndrome 03/21/2017   Major depressive disorder, recurrent, severe with psychotic features (HCC) 03/12/2017   Suicidal ideation    MDD (major depressive disorder), recurrent episode, severe (HCC) 05/03/2016   GAD (generalized anxiety disorder) 05/13/2015   Myalgia 08/07/2014   Positive ANA (antinuclear antibody) 08/07/2014   Hypothyroidism 08/07/2014

## 2023-06-16 NOTE — ED Notes (Signed)
 Pt voices c/o persistent nausea and diarrhea. Denies SI/ HI/.AVH.  states that she is unable to keep liquids down. She isolates to her room, disorganized thought process. She states she feels dehydrated and that she feels she need IV fluids. Instructed that staff would check her vs and notify the provider of her request to be sent to ED. Pt is anxious, achy and does not feel well. Will continue to monitor for safety

## 2023-06-16 NOTE — ED Notes (Addendum)
 Patient alert and oriented to self/unit. Patient's affect is flat, eye contact is minimal, and speech is pressured. Patient verbalizes complaints of "jitters", increased anxiety, and headache. Attempts made to further assess patient which was difficult as patient remained sarcastic with staff and other patients regarding an incident that was occurring on the unit. Patient then went on the phone which progressed to escalate with patient's agitation as she began yelling at an unknown person on the phone, cussing, and ultimately ended the phone call by slamming the phone on the receiver. Patient then entered room but emerged a short time later to return to the phone continuing her verbal assault on the unknown person on the line regarding "them calling you in the morning and it dictating the outcome of me" while utilizing durogatory statements such as "you aren't this fucking stupid, don't play like you are." Patient ultimately de escalated herself after expressing her complaints to another patient on the unit. Patient denies S/I and H/I. Denies A/V/H. States that appetite has been good and denies problems with G/I. Continuing to monitor/support.

## 2023-06-16 NOTE — ED Notes (Signed)
 Patient alert & oriented x4. Denies intent to harm self or others when asked. Denies A/VH. Patient reports pain in abdomen rating 9/10 as well as diarrhea (PRN medications given at 0517). No acute distress noted. Support and encouragement provided. Routine safety checks conducted per facility protocol. Encouraged patient to notify staff if any thoughts of harm towards self or others arise. Patient verbalizes understanding and agreement.

## 2023-06-16 NOTE — ED Notes (Signed)
 Pt with c/o diarrhea, nausea, stomach cramps and aches. Medicated with PRN meds per order.

## 2023-06-16 NOTE — ED Notes (Signed)
 Patient resting with eyes closed in no apparent acute distress. Respirations even and unlabored. Environment secured. Safety checks in place according to facility policy.

## 2023-06-16 NOTE — ED Provider Notes (Signed)
 Behavioral Health Progress Note  Date and Time: 06/16/2023 2:36 PM Name: Victoria Galvan MRN:  [id number]  Subjective:   The patient is a 53 year old female with a history of opioid use disorder.  She initially presented to the emergency department reporting issues with fentanyl use and frequent suicidal thoughts.  She was admitted to the facility based crisis.  She was put under involuntary commitment based on a suicidal statement she made to the attending psychiatrist.  Today the patient is irritable.  She request discharge but we discussed that this is not possible given her current safety concerns.  The patient expressed her understanding.  She feels that her medications are working well and denies experiencing any medication side effects.  She denies experiencing any suicidal or homicidal thoughts.  She reports experiencing persistent diarrhea and wonders if this is a withdrawal symptoms.  Diagnosis:  Final diagnoses:  Opioid use with withdrawal (HCC)    Total Time spent with patient: 15 minutes  Past Psychiatric History: See H&P and above Past Medical History: See H&P and above Family History: See H&P and above Family Psychiatric  History: See H&P and above Social History: See H&P and above  Additional Social History:  none  Sleep: fair  Appetite:  fair  Current Medications:  Current Facility-Administered Medications  Medication Dose Route Frequency Provider Last Rate Last Admin   acetaminophen (TYLENOL) tablet 650 mg  650 mg Oral Q6H PRN Maryagnes Amos, FNP   650 mg at 06/16/23 1423   alum & mag hydroxide-simeth (MAALOX/MYLANTA) 200-200-20 MG/5ML suspension 30 mL  30 mL Oral Q4H PRN Maryagnes Amos, FNP   30 mL at 06/16/23 1422   cloNIDine (CATAPRES) tablet 0.1 mg  0.1 mg Oral BID Maryagnes Amos, FNP   0.1 mg at 06/16/23 8295   Followed by   Melene Muller ON 06/17/2023] cloNIDine (CATAPRES) tablet 0.1 mg  0.1 mg Oral Daily Starkes-Perry, Juel Burrow, FNP        dicyclomine (BENTYL) capsule 20 mg  20 mg Oral Q6H PRN Maryagnes Amos, FNP   20 mg at 06/16/23 1423   haloperidol (HALDOL) tablet 5 mg  5 mg Oral TID PRN Maryagnes Amos, FNP       And   diphenhydrAMINE (BENADRYL) capsule 50 mg  50 mg Oral TID PRN Starkes-Perry, Juel Burrow, FNP       haloperidol lactate (HALDOL) injection 5 mg  5 mg Intramuscular TID PRN Starkes-Perry, Juel Burrow, FNP       And   diphenhydrAMINE (BENADRYL) injection 50 mg  50 mg Intramuscular TID PRN Starkes-Perry, Juel Burrow, FNP       And   LORazepam (ATIVAN) injection 2 mg  2 mg Intramuscular TID PRN Maryagnes Amos, FNP       hydrOXYzine (ATARAX) tablet 25 mg  25 mg Oral Q6H PRN Maryagnes Amos, FNP   25 mg at 06/16/23 0517   loperamide (IMODIUM) capsule 2-4 mg  2-4 mg Oral PRN Maryagnes Amos, FNP   4 mg at 06/16/23 1422   LORazepam (ATIVAN) tablet 1 mg  1 mg Oral Q6H PRN Maryagnes Amos, FNP   1 mg at 06/14/23 6213   Or   LORazepam (ATIVAN) injection 2 mg  2 mg Intramuscular Q6H PRN Starkes-Perry, Juel Burrow, FNP       magnesium hydroxide (MILK OF MAGNESIA) suspension 30 mL  30 mL Oral Daily PRN Starkes-Perry, Juel Burrow, FNP       melatonin tablet 3  mg  3 mg Oral QHS PRN Onuoha, Chinwendu V, NP   3 mg at 06/15/23 2202   methocarbamol (ROBAXIN) tablet 500 mg  500 mg Oral Q8H PRN Maryagnes Amos, FNP   500 mg at 06/16/23 1191   naproxen (NAPROSYN) tablet 500 mg  500 mg Oral BID PRN Maryagnes Amos, FNP   500 mg at 06/16/23 0756   nicotine (NICODERM CQ - dosed in mg/24 hours) patch 21 mg  21 mg Transdermal Daily Starkes-Perry, Juel Burrow, FNP       ondansetron (ZOFRAN-ODT) disintegrating tablet 4 mg  4 mg Oral Q6H PRN Maryagnes Amos, FNP   4 mg at 06/16/23 4782   QUEtiapine (SEROQUEL) tablet 100 mg  100 mg Oral QHS Zouev, Dmitri, MD       QUEtiapine (SEROQUEL) tablet 25 mg  25 mg Oral Q6H PRN Miguel Rota, MD       [START ON 06/17/2023] QUEtiapine (SEROQUEL) tablet 50 mg  50 mg  Oral Daily Zouev, Dmitri, MD       Current Outpatient Medications  Medication Sig Dispense Refill   acetaminophen (TYLENOL) 500 MG tablet Take 1,500 mg by mouth every 6 (six) hours as needed for mild pain (pain score 1-3).     diphenhydrAMINE (BENADRYL) 25 MG tablet Take 50 mg by mouth every 6 (six) hours as needed for allergies.     loperamide (IMODIUM) 2 MG capsule Take 4 mg by mouth as needed for diarrhea or loose stools.     Facility-Administered Medications Ordered in Other Encounters  Medication Dose Route Frequency Provider Last Rate Last Admin   gabapentin (NEURONTIN) tablet 300 mg  300 mg Oral TID Pucilowska, Jolanta B, MD       hydrOXYzine (ATARAX/VISTARIL) tablet 50 mg  50 mg Oral TID PRN Shari Prows, MD        Labs  Lab Results:  Admission on 06/12/2023, Discharged on 06/13/2023  Component Date Value Ref Range Status   Sodium 06/12/2023 136  135 - 145 mmol/L Final   Potassium 06/12/2023 3.1 (L)  3.5 - 5.1 mmol/L Final   Chloride 06/12/2023 101  98 - 111 mmol/L Final   CO2 06/12/2023 25  22 - 32 mmol/L Final   Glucose, Bld 06/12/2023 99  70 - 99 mg/dL Final   Glucose reference range applies only to samples taken after fasting for at least 8 hours.   BUN 06/12/2023 8  6 - 20 mg/dL Final   Creatinine, Ser 06/12/2023 0.80  0.44 - 1.00 mg/dL Final   Calcium 95/62/1308 9.1  8.9 - 10.3 mg/dL Final   Total Protein 65/78/4696 7.4  6.5 - 8.1 g/dL Final   Albumin 29/52/8413 4.3  3.5 - 5.0 g/dL Final   AST 24/40/1027 25  15 - 41 U/L Final   ALT 06/12/2023 20  0 - 44 U/L Final   Alkaline Phosphatase 06/12/2023 118  38 - 126 U/L Final   Total Bilirubin 06/12/2023 0.6  0.0 - 1.2 mg/dL Final   GFR, Estimated 06/12/2023 >60  >60 mL/min Final   Comment: (NOTE) Calculated using the CKD-EPI Creatinine Equation (2021)    Anion gap 06/12/2023 10  5 - 15 Final   Performed at [facility], [address] [place], [place] [address]   Alcohol, Ethyl (B) 06/12/2023 <10  <10 mg/dL  Final   Comment: (NOTE) Lowest detectable limit for serum alcohol is 10 mg/dL.  For medical purposes only. Performed at [facility], [address], [place],  [place] [address]    Opiates 06/12/2023 NONE DETECTED  NONE DETECTED Final   Cocaine 06/12/2023 NONE DETECTED  NONE DETECTED Final   Benzodiazepines 06/12/2023 POSITIVE (A)  NONE DETECTED Final   Amphetamines 06/12/2023 NONE DETECTED  NONE DETECTED Final   Tetrahydrocannabinol 06/12/2023 NONE DETECTED  NONE DETECTED Final   Barbiturates 06/12/2023 NONE DETECTED  NONE DETECTED Final   Comment: (NOTE) DRUG SCREEN FOR MEDICAL PURPOSES ONLY.  IF CONFIRMATION IS NEEDED FOR ANY PURPOSE, NOTIFY LAB WITHIN 5 DAYS.  LOWEST DETECTABLE LIMITS FOR URINE DRUG SCREEN Drug Class                     Cutoff (ng/mL) Amphetamine and metabolites    1000 Barbiturate and metabolites    200 Benzodiazepine                 200 Opiates and metabolites        300 Cocaine and metabolites        300 THC                            50 Performed at [facility], [address] [place], [place] [address]    WBC 06/12/2023 12.4 (H)  4.0 - 10.5 K/uL Final   RBC 06/12/2023 4.62  3.87 - 5.11 MIL/uL Final   Hemoglobin 06/12/2023 14.8  12.0 - 15.0 g/dL Final   HCT 09/81/1914 42.0  36.0 - 46.0 % Final   MCV 06/12/2023 90.9  80.0 - 100.0 fL Final   MCH 06/12/2023 32.0  26.0 - 34.0 pg Final   MCHC 06/12/2023 35.2  30.0 - 36.0 g/dL Final   RDW 78/29/5621 12.8  11.5 - 15.5 % Final   Platelets 06/12/2023 295  150 - 400 K/uL Final   nRBC 06/12/2023 0.0  0.0 - 0.2 % Final   Neutrophils Relative % 06/12/2023 76  % Final   Neutro Abs 06/12/2023 9.4 (H)  1.7 - 7.7 K/uL Final   Lymphocytes Relative 06/12/2023 13  % Final   Lymphs Abs 06/12/2023 1.7  0.7 - 4.0 K/uL Final   Monocytes Relative 06/12/2023 7  % Final   Monocytes Absolute 06/12/2023 0.8  0.1 - 1.0 K/uL Final   Eosinophils Relative 06/12/2023 3  % Final   Eosinophils Absolute 06/12/2023 0.4  0.0 - 0.5  K/uL Final   Basophils Relative 06/12/2023 1  % Final   Basophils Absolute 06/12/2023 0.1  0.0 - 0.1 K/uL Final   Immature Granulocytes 06/12/2023 0  % Final   Abs Immature Granulocytes 06/12/2023 0.03  0.00 - 0.07 K/uL Final   Performed at [facility], [address] [place], [place] [address]   Magnesium 06/12/2023 2.1  1.7 - 2.4 mg/dL Final   Performed at [facility], [address], [place], [place] [address]    Blood Alcohol level:  Lab Results  Component Value Date   [facility] <10 06/12/2023   ETH <10 03/12/2017    Metabolic Disorder Labs: Lab Results  Component Value Date   HGBA1C 5.5 03/20/2017   MPG 111.15 03/20/2017   MPG 103 05/05/2016   No results found for: "PROLACTIN" Lab Results  Component Value Date   CHOL 203 (H) 03/20/2017   TRIG 770 (H) 03/20/2017   HDL 37 (L) 03/20/2017   CHOLHDL 5.5 03/20/2017   VLDL UNABLE TO CALCULATE IF TRIGLYCERIDE OVER 400 mg/dL 62/95/2841   LDLCALC UNABLE TO CALCULATE IF TRIGLYCERIDE OVER 400 mg/dL  03/20/2017   LDLCALC 129 (H) 05/05/2016    Therapeutic Lab Levels: No results found for: "LITHIUM" No results found for: "VALPROATE" No results found for: "CBMZ"  Physical Findings   AIMS    Flowsheet Row [facility]  AIMS Total Score 0      AUDIT    Flowsheet Row Admission (Discharged) from 03/12/2017 in [facility]  Alcohol Use Disorder Identification Test Final Score (AUDIT) 1 2      PHQ2-9    Flowsheet Row ED from 06/13/2023 in [facility]  PHQ-2 Total Score 2  PHQ-9 Total Score 8      Flowsheet Row ED from 06/13/2023 in [facility] ED from 06/12/2023 in [facility] Emergency Department at [facility] ED from 06/06/2023 in [facility] Emergency Department at [facility]  C-SSRS RISK CATEGORY High Risk High Risk No Risk        Psychiatric Specialty Exam: Physical Exam Constitutional:      Appearance: the patient is not toxic-appearing.  Pulmonary:     Effort: Pulmonary effort is normal.  Neurological:     General: No focal deficit present.     Mental Status: the patient is alert and oriented to person, place, and time.   Review of Systems  Respiratory:  Negative for shortness of breath.   Cardiovascular:  Negative for chest pain.  Gastrointestinal:  Negative for abdominal pain, constipation, diarrhea, nausea and vomiting.  Neurological:  Negative for headaches.      BP (!) 132/92   Pulse 95   Temp 98 F (36.7 C) (Oral)   Resp 16   SpO2 99%   General Appearance: Fairly Groomed  Eye Contact:  Good  Speech:  Clear and Coherent  Volume:  Normal  Mood:  Euthymic  Affect:  Congruent  Thought Process:  Coherent  Orientation:  Full (Time, Place, and Person)  Thought Content: Logical   Suicidal Thoughts:  No  Homicidal Thoughts:  No  Memory:  Immediate;   Good  Judgement:  fair  Insight:  fair  Psychomotor Activity:  Normal  Concentration:  Concentration: Good  Recall:  Good  Fund of Knowledge: Good  Language: Good  Akathisia:  No  Handed:  not assessed  AIMS (if indicated): not done  Assets:  Communication Skills Desire for Improvement Leisure Time Physical Health  ADL's:  Intact  Cognition: WNL  Sleep:  Fair    Treatment Plan Summary: Daily contact with patient to assess and evaluate symptoms and progress in treatment and Medication management.  Opioid use disorder - As needed medication for somatic complaints - Increase Seroquel from 25 mg in the morning and 50 mg at night to 50 mg in the morning and 100 mg at night - Continue as needed Seroquel  Recheck BMP tomorrow, to check potassium  Carlyn Reichert, MD 06/16/2023 2:36 PM

## 2023-06-16 NOTE — ED Notes (Signed)
 Pt received meds, and Gatorade. She is resting in room. No vomiting noted. No noted distress. Will continue to monitor for safety

## 2023-06-16 NOTE — ED Notes (Signed)
 Patient observed/assessed in room in bed appearing in no immediate distress resting peacefully. Q15 minute checks continued by MHT and nursing staff. Will continue to monitor and support.

## 2023-06-16 NOTE — ED Notes (Signed)
 PRN Naproxen given due to patient reports of cramping pains. Medication administered with no complications. Environment secured, safety checks in place per facility policy.

## 2023-06-16 NOTE — ED Notes (Signed)
 Patient sitting in hallway, calm and composed. No acute distress noted. Patient reports pain, unchanged after PRN Naproxen. Patient declined offer of PRN Tylenol. Informed patient to notify staff with any needs or assistance. Patient verbalized understanding or agreement. Safety checks in place per facility policy.

## 2023-06-16 NOTE — Group Note (Signed)
 Group Topic: Communication  Group Date: 06/16/2023 Start Time: 1030 End Time: 1100 Facilitators: Ninfa Linden, NT +3 MHT 2 Department: [facility]  Number of Participants: No one attended Group Group Focus: substance abuse education Treatment Modality:  Psychoeducation Interventions utilized were problem solving Purpose: improve communication skills  Name: Victoria Galvan Date of Birth: 1970-11-25  MR: [id number]    Level of Participation: Patient did not attend group Quality of Participation: N/A Interactions with others: N/A Mood/Affect: N/A Triggers (if applicable): N/A Cognition: N/A Progress: N/A Response: N/A Plan: N/A  Patients Problems:  Patient Active Problem List   Diagnosis Date Noted   Fentanyl use disorder, severe (HCC) 06/13/2023   Mixed connective tissue disease (HCC) 06/13/2023   Substance induced mood disorder (HCC) 06/13/2023   High triglycerides 03/21/2017   Tobacco use disorder 03/21/2017   Chronic pain syndrome 03/21/2017   Major depressive disorder, recurrent, severe with psychotic features (HCC) 03/12/2017   Suicidal ideation    MDD (major depressive disorder), recurrent episode, severe (HCC) 05/03/2016   GAD (generalized anxiety disorder) 05/13/2015   Myalgia 08/07/2014   Positive ANA (antinuclear antibody) 08/07/2014   Hypothyroidism 08/07/2014

## 2023-06-16 NOTE — ED Notes (Signed)
 Instructed pt that vss and that provider feels she can be managed at this level of care. She voices understanding and is will to take meds, gatorade and allow monitoring. She is calm and cooperative. Will continue to monitor.

## 2023-06-17 LAB — BASIC METABOLIC PANEL
Anion gap: 12 (ref 5–15)
BUN: 16 mg/dL (ref 6–20)
CO2: 23 mmol/L (ref 22–32)
Calcium: 9.5 mg/dL (ref 8.9–10.3)
Chloride: 104 mmol/L (ref 98–111)
Creatinine, Ser: 0.88 mg/dL (ref 0.44–1.00)
GFR, Estimated: >60 mL/min (ref >60)
Glucose, Bld: 84 mg/dL (ref 70–99)
Potassium: 3.5 mmol/L (ref 3.5–5.1)
Sodium: 139 mmol/L (ref 135–145)

## 2023-06-17 MED ORDER — QUETIAPINE FUMARATE 50 MG PO TABS
50.0000 mg | ORAL_TABLET | Freq: Every day | ORAL | 0 refills | Status: DC
Start: 2023-06-18 — End: 2023-10-23

## 2023-06-17 MED ORDER — QUETIAPINE FUMARATE 100 MG PO TABS: 100.0000 mg | ORAL_TABLET | Freq: Every day | ORAL | 0 refills | Status: DC

## 2023-06-17 MED ORDER — NICOTINE 21 MG/24HR TD PT24: 21.0000 mg | MEDICATED_PATCH | Freq: Every day | TRANSDERMAL | 0 refills | Status: DC

## 2023-06-17 MED ORDER — HYDROXYZINE HCL 25 MG PO TABS: 25.0000 mg | ORAL_TABLET | Freq: Four times a day (QID) | ORAL | 0 refills | Status: DC | PRN

## 2023-06-17 MED ORDER — MELATONIN 3 MG PO TABS
3.0000 mg | ORAL_TABLET | Freq: Every evening | ORAL | Status: DC | PRN
Start: 2023-06-17 — End: 2023-10-23

## 2023-06-17 MED ORDER — GABAPENTIN 300 MG PO CAPS
300.0000 mg | ORAL_CAPSULE | Freq: Two times a day (BID) | ORAL | Status: DC
  Administered 2023-06-17 (×2): 300 mg via ORAL
  Filled 2023-06-17 (×2): qty 1

## 2023-06-17 MED ORDER — ROPINIROLE HCL 0.25 MG PO TABS
0.5000 mg | ORAL_TABLET | Freq: Once | ORAL | Status: AC
  Administered 2023-06-17: 0.5 mg via ORAL
  Filled 2023-06-17: qty 2

## 2023-06-17 MED ORDER — QUETIAPINE FUMARATE 50 MG PO TABS: 50.0000 mg | ORAL_TABLET | Freq: Four times a day (QID) | ORAL | Status: DC | PRN

## 2023-06-17 MED ORDER — GABAPENTIN 300 MG PO CAPS: 300.0000 mg | ORAL_CAPSULE | Freq: Two times a day (BID) | ORAL | 0 refills | Status: DC

## 2023-06-17 MED ORDER — QUETIAPINE FUMARATE 50 MG PO TABS: 50.0000 mg | ORAL_TABLET | Freq: Four times a day (QID) | ORAL | 0 refills | Status: DC | PRN

## 2023-06-17 NOTE — ED Notes (Signed)
 Patient remains awake, has expressed no improvement in her symptoms and continues to express difficulty with her legs sensation. She is requesting something more to assist her with sleep if we are unable to further treat her RLS. NP's on staff notified.

## 2023-06-17 NOTE — ED Provider Notes (Signed)
 FBC/OBS ASAP Discharge Summary  Date and Time: 06/17/2023 2:13 PM  Name: Victoria Galvan  MRN:  [id number]   Discharge Diagnoses:  Final diagnoses:  Opioid use with withdrawal [facility])    Subjective:  The patient is a 53 year old female with a history of opioid use disorder. She initially presented to the emergency department reporting issues with fentanyl use and frequent suicidal thoughts. She was admitted to the facility based crisis. She was put under involuntary commitment based on a suicidal statement she made to the attending psychiatrist.   Stay Summary:  Improvement was noted after the involuntary commitment process was finished.  The patient was pleasant and demonstrated more insight.  She appeared to respond well to starting Seroquel and increasing the medication.  We did discuss the risk of weight gain and tardive dyskinesia.  The patient did receive agitation medications on the night of 2/22, which appears to have been related to the patient seeking help before acting out.    We recommended to the patient at multiple points that she should go to residential rehab for more definitive treatment for her substance use problems.  The patient declined this multiple times and on the day of discharge.  She desires to continue doing AA groups and receive medication management services to continue her Seroquel.  We discussed the patient's discharge plan with her husband, Victoria Galvan, [phone].  He states that he has no safety concerns regarding the patient coming home.  The patient has not made any suicidal statements to him and she has consistently denied suicidal thoughts to [place] over the past 48 hours.  The patient denies auditory/visual hallucinations.  The patient reports good mood, appetite, and sleep. They deny suicidal and homicidal thoughts. The patient denies side effects from their medications.  Review of systems as below. The patient denies experiencing any withdrawal symptoms.   On the  day of discharge, the patient's Seroquel had been increased to 50 mg in the daytime and 100 mg at night, with a 50 mg as needed dose.  Total Time spent with patient: 20 minutes  Past Psychiatric History: as above Past Medical History: as above Family History: none Family Psychiatric History: none Social History: as above Tobacco Cessation:  A prescription for an FDA-approved tobacco cessation medication provided at discharge   Current Medications:  Current Facility-Administered Medications  Medication Dose Route Frequency Provider Last Rate Last Admin   acetaminophen (TYLENOL) tablet 650 mg  650 mg Oral Q6H PRN Maryagnes Amos, FNP   650 mg at 06/16/23 1423   alum & mag hydroxide-simeth (MAALOX/MYLANTA) 200-200-20 MG/5ML suspension 30 mL  30 mL Oral Q4H PRN Maryagnes Amos, FNP   30 mL at 06/16/23 1422   cloNIDine (CATAPRES) tablet 0.1 mg  0.1 mg Oral Daily Maryagnes Amos, FNP   0.1 mg at 06/17/23 9528   dicyclomine (BENTYL) capsule 20 mg  20 mg Oral Q6H PRN Maryagnes Amos, FNP   20 mg at 06/16/23 2306   haloperidol (HALDOL) tablet 5 mg  5 mg Oral TID PRN Maryagnes Amos, FNP   5 mg at 06/17/23 0749   And   diphenhydrAMINE (BENADRYL) capsule 50 mg  50 mg Oral TID PRN Maryagnes Amos, FNP   50 mg at 06/17/23 0750   haloperidol lactate (HALDOL) injection 5 mg  5 mg Intramuscular TID PRN Maryagnes Amos, FNP       And   diphenhydrAMINE (BENADRYL) injection 50 mg  50 mg Intramuscular TID  PRN Maryagnes Amos, FNP       And   LORazepam (ATIVAN) injection 2 mg  2 mg Intramuscular TID PRN Starkes-Perry, Juel Burrow, FNP       gabapentin (NEURONTIN) capsule 300 mg  300 mg Oral BID Bobbitt, Shalon E, NP   300 mg at 06/17/23 0920   hydrOXYzine (ATARAX) tablet 25 mg  25 mg Oral Q6H PRN Maryagnes Amos, FNP   25 mg at 06/16/23 2306   loperamide (IMODIUM) capsule 2-4 mg  2-4 mg Oral PRN Maryagnes Amos, FNP   4 mg at 06/17/23 0920    LORazepam (ATIVAN) tablet 1 mg  1 mg Oral Q6H PRN Maryagnes Amos, FNP   1 mg at 06/14/23 0102   Or   LORazepam (ATIVAN) injection 2 mg  2 mg Intramuscular Q6H PRN Starkes-Perry, Juel Burrow, FNP       magnesium hydroxide (MILK OF MAGNESIA) suspension 30 mL  30 mL Oral Daily PRN Starkes-Perry, Juel Burrow, FNP       melatonin tablet 3 mg  3 mg Oral QHS PRN Onuoha, Chinwendu V, NP   3 mg at 06/16/23 2306   methocarbamol (ROBAXIN) tablet 500 mg  500 mg Oral Q8H PRN Maryagnes Amos, FNP   500 mg at 06/17/23 0548   naproxen (NAPROSYN) tablet 500 mg  500 mg Oral BID PRN Maryagnes Amos, FNP   500 mg at 06/17/23 0920   nicotine (NICODERM CQ - dosed in mg/24 hours) patch 21 mg  21 mg Transdermal Daily Starkes-Perry, Juel Burrow, FNP       ondansetron (ZOFRAN-ODT) disintegrating tablet 4 mg  4 mg Oral Q6H PRN Maryagnes Amos, FNP   4 mg at 06/17/23 7253   QUEtiapine (SEROQUEL) tablet 100 mg  100 mg Oral QHS Zouev, Dmitri, MD   100 mg at 06/16/23 2154   QUEtiapine (SEROQUEL) tablet 50 mg  50 mg Oral Daily Miguel Rota, MD   50 mg at 06/17/23 6644   QUEtiapine (SEROQUEL) tablet 50 mg  50 mg Oral Q6H PRN Miguel Rota, MD       Current Outpatient Medications  Medication Sig Dispense Refill   diphenhydrAMINE (BENADRYL) 25 MG tablet Take 50 mg by mouth every 6 (six) hours as needed for allergies.     gabapentin (NEURONTIN) 300 MG capsule Take 1 capsule (300 mg total) by mouth 2 (two) times daily. 60 capsule 0   hydrOXYzine (ATARAX) 25 MG tablet Take 1 tablet (25 mg total) by mouth every 6 (six) hours as needed for itching. 30 tablet 0   loperamide (IMODIUM) 2 MG capsule Take 4 mg by mouth as needed for diarrhea or loose stools.     melatonin 3 MG TABS tablet Take 1 tablet (3 mg total) by mouth at bedtime as needed.     [START ON 06/18/2023] nicotine (NICODERM CQ - DOSED IN MG/24 HOURS) 21 mg/24hr patch Place 1 patch (21 mg total) onto the skin daily. 28 patch 0   QUEtiapine (SEROQUEL) 100 MG  tablet Take 1 tablet (100 mg total) by mouth at bedtime. 30 tablet 0   [START ON 06/18/2023] QUEtiapine (SEROQUEL) 50 MG tablet Take 1 tablet (50 mg total) by mouth daily. 30 tablet 0   QUEtiapine (SEROQUEL) 50 MG tablet Take 1 tablet (50 mg total) by mouth every 6 (six) hours as needed (for anxiety). 30 tablet 0   Facility-Administered Medications Ordered in Other Encounters  Medication Dose Route Frequency Provider Last Rate Last Admin  gabapentin (NEURONTIN) tablet 300 mg  300 mg Oral TID Pucilowska, Jolanta B, MD       hydrOXYzine (ATARAX/VISTARIL) tablet 50 mg  50 mg Oral TID PRN Pucilowska, Jolanta B, MD        PTA Medications:  PTA Medications  Medication Sig   diphenhydrAMINE (BENADRYL) 25 MG tablet Take 50 mg by mouth every 6 (six) hours as needed for allergies.   loperamide (IMODIUM) 2 MG capsule Take 4 mg by mouth as needed for diarrhea or loose stools.   gabapentin (NEURONTIN) 300 MG capsule Take 1 capsule (300 mg total) by mouth 2 (two) times daily.   hydrOXYzine (ATARAX) 25 MG tablet Take 1 tablet (25 mg total) by mouth every 6 (six) hours as needed for itching.   melatonin 3 MG TABS tablet Take 1 tablet (3 mg total) by mouth at bedtime as needed.   [START ON 06/18/2023] nicotine (NICODERM CQ - DOSED IN MG/24 HOURS) 21 mg/24hr patch Place 1 patch (21 mg total) onto the skin daily.   QUEtiapine (SEROQUEL) 100 MG tablet Take 1 tablet (100 mg total) by mouth at bedtime.   [START ON 06/18/2023] QUEtiapine (SEROQUEL) 50 MG tablet Take 1 tablet (50 mg total) by mouth daily.   QUEtiapine (SEROQUEL) 50 MG tablet Take 1 tablet (50 mg total) by mouth every 6 (six) hours as needed (for anxiety).   Facility Ordered Medications  Medication   hydrOXYzine (ATARAX/VISTARIL) tablet 50 mg   gabapentin (NEURONTIN) tablet 300 mg   [EXPIRED] cloNIDine (CATAPRES) tablet 0.1 mg   Followed by   [COMPLETED] cloNIDine (CATAPRES) tablet 0.1 mg   Followed by   cloNIDine (CATAPRES) tablet 0.1 mg    dicyclomine (BENTYL) capsule 20 mg   hydrOXYzine (ATARAX) tablet 25 mg   loperamide (IMODIUM) capsule 2-4 mg   methocarbamol (ROBAXIN) tablet 500 mg   naproxen (NAPROSYN) tablet 500 mg   nicotine (NICODERM CQ - dosed in mg/24 hours) patch 21 mg   ondansetron (ZOFRAN-ODT) disintegrating tablet 4 mg   acetaminophen (TYLENOL) tablet 650 mg   alum & mag hydroxide-simeth (MAALOX/MYLANTA) 200-200-20 MG/5ML suspension 30 mL   magnesium hydroxide (MILK OF MAGNESIA) suspension 30 mL   LORazepam (ATIVAN) tablet 1 mg   Or   LORazepam (ATIVAN) injection 2 mg   haloperidol lactate (HALDOL) injection 5 mg   And   diphenhydrAMINE (BENADRYL) injection 50 mg   And   LORazepam (ATIVAN) injection 2 mg   haloperidol (HALDOL) tablet 5 mg   And   diphenhydrAMINE (BENADRYL) capsule 50 mg   [COMPLETED] haloperidol lactate (HALDOL) injection 5 mg   [COMPLETED] LORazepam (ATIVAN) injection 2 mg   [COMPLETED] diphenhydrAMINE (BENADRYL) injection 50 mg   melatonin tablet 3 mg   [COMPLETED] ondansetron (ZOFRAN-ODT) disintegrating tablet 4 mg   [COMPLETED] LORazepam (ATIVAN) tablet 1 mg   [COMPLETED] potassium chloride (KLOR-CON M) CR tablet 10 mEq   QUEtiapine (SEROQUEL) tablet 50 mg   [COMPLETED] QUEtiapine (SEROQUEL) tablet 25 mg   QUEtiapine (SEROQUEL) tablet 100 mg   gabapentin (NEURONTIN) capsule 300 mg   [COMPLETED] rOPINIRole (REQUIP) tablet 0.5 mg   QUEtiapine (SEROQUEL) tablet 50 mg       06/17/2023    2:12 PM 06/16/2023    2:35 PM 06/14/2023    7:36 PM  Depression screen PHQ 2/9  Decreased Interest 0 1 3  Down, Depressed, Hopeless 0 1 3  PHQ - 2 Score 0 2 6  Altered sleeping 0 1 3  Tired, decreased energy 0 1  3  Change in appetite 0 1 0  Feeling bad or failure about yourself  0 1 1  Trouble concentrating 0 1 1  Moving slowly or fidgety/restless 0 1 1  Suicidal thoughts 0 0 3  PHQ-9 Score 0 8 18  Difficult doing work/chores Not difficult at all Somewhat difficult Somewhat difficult     Flowsheet Row ED from 06/13/2023 in [facility] ED from 06/12/2023 in [facility] Emergency Department at [facility] ED from 06/06/2023 in [facility] Emergency Department at [facility]  C-SSRS RISK CATEGORY High Risk High Risk No Risk      Musculoskeletal  Strength & Muscle Tone: within normal limits Gait & Station: normal Patient leans: N/A  Psychiatric Specialty Exam  Presentation General Appearance: Appropriate for Environment  Eye Contact:Fair  Speech:Clear and Coherent  Speech Volume:Normal  Handedness:-- (not assessed)   Mood and Affect  Mood:Euthymic  Affect:Congruent   Thought Process  Thought Processes:Coherent; Linear  Descriptions of Associations:Intact  Orientation:Full (Time, Place and Person)  Thought Content:Logical    Hallucinations:Hallucinations: None  Ideas of Reference:None  Suicidal Thoughts:Suicidal Thoughts: No  Homicidal Thoughts:Homicidal Thoughts: No   Sensorium  Memory:Immediate Fair; Recent Fair; Remote Fair  Judgment:Fair  Insight:Fair   Executive Functions  Concentration:Fair  Attention Span:Fair  Recall:Fair  Fund of Knowledge:Fair  Language:Fair   Psychomotor Activity  Psychomotor Activity:Psychomotor Activity: Normal   Assets  Assets:Communication Skills; Resilience   Sleep  Sleep:Sleep: Fair    Physical Exam Constitutional:      Appearance: the patient is not toxic-appearing.  Pulmonary:     Effort: Pulmonary effort is normal.  Neurological:     General: No focal deficit present.     Mental Status: the patient is alert and oriented to person, place, and time.   Review of Systems  Respiratory:  Negative for shortness of breath.   Cardiovascular:  Negative for chest pain.  Gastrointestinal:  Negative for abdominal pain, constipation, diarrhea, nausea and vomiting.  Neurological:  Negative for headaches.    BP (!) 142/74   Pulse (!) 57    Temp 98 F (36.7 C) (Oral)   Resp 16   SpO2 99%   Demographic Factors:  None pertinent   Loss Factors: NA   Historical Factors: NA   Risk Reduction Factors:   Positive social support, Positive therapeutic relationship, and Positive coping skills or problem solving skills   Continued Clinical Symptoms:  Substance use    Cognitive Features That Contribute To Risk:  None     Suicide Risk:  Mild   Plan Of Care/Follow-up recommendations:  Activity as tolerated. Diet as recommended by PCP. Keep all scheduled follow-up appointments as recommended.   Patient is instructed to take all prescribed medications as recommended. Report any side effects or adverse reactions to your outpatient psychiatrist. Patient is instructed to abstain from alcohol and illegal drugs while on prescription medications. In the event of worsening symptoms, patient is instructed to call the crisis hotline, 911, or go to the nearest emergency department for evaluation and treatment.     Patient is also instructed prior to discharge to: Take all medications as prescribed by mental healthcare provider. Report any adverse effects and or reactions from the medicines to outpatient provider promptly. Patient has been instructed & cautioned: To not engage in alcohol and or illegal drug use while on prescription medicines. In the event of worsening symptoms,  patient is instructed to call the crisis hotline, 911 and or  go to the nearest ED for appropriate evaluation and treatment of symptoms. To follow-up with primary care provider for other medical issues, concerns and or health care needs    Disposition: Self-care   Carlyn Reichert, MD PGY-3

## 2023-06-17 NOTE — ED Notes (Signed)
 Patient has remained restless throughout the night and has been observed multiple times pacing the hallways, walking out to the nursing station, shaking her legs, and shifting positions in the bed. Patient verbalizes that she has an uncontrollable sensation to move her legs and a tugging/twitching feeling in her legs. Patient has remained outside pacing around the nursing station verbalizing her frustration with the sensation. Upon reviewing the chart, this directly coincides with the increase in seroquel doses that she received today with the onset. Nps on staff notified, and patient verbally agreed to discuss with the medical provider in the AM as well.

## 2023-06-17 NOTE — ED Notes (Signed)
 Patient discharged home per MD order. After Visit Summary (AVS) printed and given to patient. AVS reviewed with patient and all questions fully answered. Patient discharged in no acute distress, A& O x4 and ambulatory. Patient denied SI/HI, A/VH upon discharge. Patient verbalized understanding of all discharge instructions explained by staff, RX's and safety plan. Patient mood fair.  Patient belongings returned to patient from locker #9 complete and intact. Patient escorted to lobby via staff for transport to destination. Safety maintained.

## 2023-06-17 NOTE — ED Notes (Signed)
 Patient approached nurses' station asking what shots they can receive and stated "everyone else is getting good stuff so I want to know what I can get". Patient had spoken with peer who had received agitation protocol of Haldol, Benadryl, and Ativan IM last night. Patient continue asking about what medications are available as several peers have received IM medications recently, patient becoming increasingly irate in regards to medication. Writer informed patient there are PO options available if patient would like those. PRN Haldol 5 mg PO and PRN Benadryl 50 mg PO administered with no complications. Patient asked while taking medications "is there anything else I can have right now". Writer stated they will look and may need to reach out to the provider. Patient in no acute distress. Environment secured, safety checks in place per facility policy.

## 2023-06-17 NOTE — ED Notes (Signed)
 Patient observed/assessed in room in bed appearing in no immediate distress resting peacefully. Q15 minute checks continued by MHT and nursing staff. Will continue to monitor and support.

## 2023-06-17 NOTE — ED Notes (Signed)
 Patient agitated regarding discharge while on phone, patient verbally agitated.

## 2023-06-17 NOTE — ED Notes (Addendum)
 Patient sitting in dayroom interacting with peers. No acute distress noted. No concerns voiced. Informed patient to notify staff with any needs or assistance. Patient verbalized understanding or agreement. Safety checks in place per facility policy.

## 2023-06-17 NOTE — Group Note (Signed)
 Group Topic: Relapse and Recovery  Group Date: 06/17/2023 Start Time: 1050 End Time: 1101 Facilitators: Marilene Vath, Jacklynn Barnacle, RN  Department: [facility]  Number of Participants: 1  Group Focus: discharge education Treatment Modality:  Individual Therapy Interventions utilized were patient education Purpose: Discharge education  Name: Victoria Galvan Date of Birth: 06/27/1970  MR: [id number]    Level of Participation: active Quality of Participation: attentive, cooperative, and engaged Interactions with others: gave feedback Mood/Affect: appropriate Triggers (if applicable): None identified Cognition: coherent/clear Progress: Significant Response: Patient voices understanding of discharge instructions. No questions at this time. Plan: Discharge home.  Patients Problems:  Patient Active Problem List   Diagnosis Date Noted   Fentanyl use disorder, severe (HCC) 06/13/2023   Mixed connective tissue disease (HCC) 06/13/2023   Substance induced mood disorder (HCC) 06/13/2023   High triglycerides 03/21/2017   Tobacco use disorder 03/21/2017   Chronic pain syndrome 03/21/2017   Major depressive disorder, recurrent, severe with psychotic features (HCC) 03/12/2017   Suicidal ideation    MDD (major depressive disorder), recurrent episode, severe (HCC) 05/03/2016   GAD (generalized anxiety disorder) 05/13/2015   Myalgia 08/07/2014   Positive ANA (antinuclear antibody) 08/07/2014   Hypothyroidism 08/07/2014

## 2023-06-17 NOTE — ED Notes (Addendum)
 Patient alert & oriented x4. Denies intent to harm self or others when asked. Denies A/VH. Patient reported generalized aches, PRN Naproxen given. Scheduled and PRN medications administered with no complications. Pain reassessed, now at a 2/10. No acute distress noted. Support and encouragement provided. Patient plan to discharge this AM, voices good mood in regards to d/c plan. Routine safety checks conducted per facility protocol. Encouraged patient to notify staff if any thoughts of harm towards self or others arise. Patient verbalizes understanding and agreement.

## 2023-06-17 NOTE — Discharge Instructions (Signed)
Patient is instructed prior to discharge to:  Take all medications as prescribed by his/her mental healthcare provider. Report any adverse effects and or reactions from the medicines to his/her outpatient provider promptly. Keep all scheduled appointments, to ensure that you are getting refills on time and to avoid any interruption in your medication.  If you are unable to keep an appointment call to reschedule.  Be sure to follow-up with resources and follow-up appointments provided.  Patient has been instructed & cautioned: To not engage in alcohol and or illegal drug use while on prescription medicines. In the event of worsening symptoms, patient is instructed to call the crisis hotline, 911 and or go to the nearest ED for appropriate evaluation and treatment of symptoms. To follow-up with his/her primary care provider for your other medical issues, concerns and or health care needs.    

## 2023-06-29 NOTE — ED Provider Notes (Addendum)
 Emergency Department Provider Note    ED Clinical Impression   Final diagnoses:  Leg edema (Primary)  Dyspnea, unspecified type    ED Assessment/Plan    History   Chief Complaint  Patient presents with  . Shortness of Breath    Gain 16 lbs in 3 days   HPI  2240 hrs. Patient 53 year old female complex medical history she has lupus rheumatoid arthritis panic disorder anxiety thyroid  disease she apparently was seen recently for some lower extremity edema told she was hypothyroid now presenting with worsening lower extremity swelling exams awake alert cooperative.  Cardiorespiratory general neuroexam negative abdomen benign she has some 1-2+ nonpitting edema no other findings she reports previous history of thromboembolic disease with lower extremity DVT we will go ahead and check her old records this might be simply thyroid  related she was reported to be hypothyroid this may be the primary problem we will go ahead and consider her for outpatient treatment and referral she might benefit from an outpatient Doppler ultrasound  Past Medical History:  Diagnosis Date  . Anxiety   . Cervical disc disease   . Disease of thyroid  gland   . Fibromyalgia   . H/O: hysterectomy   . Panic disorder   . Rheumatoid arteritis (CMS-HCC)   . SLE (systemic lupus erythematosus) (CMS-HCC)   . Spondylitis, cervical (CMS-HCC)     Past Surgical History:  Procedure Laterality Date  . ACDF  08/2015  . HYSTERECTOMY    . PARTIAL HYSTERECTOMY    . TONSILLECTOMY      Family History  Problem Relation Age of Onset  . No Known Problems Mother   . Lupus Father     Social History   Socioeconomic History  . Marital status: Married    Spouse name: None  . Number of children: None  . Years of education: None  . Highest education level: None  Tobacco Use  . Smoking status: Every Day    Current packs/day: 0.50    Average packs/day: 0.5 packs/day for 15.0 years (7.5 ttl pk-yrs)    Types: Cigarettes   . Smokeless tobacco: Never  Substance and Sexual Activity  . Alcohol use: No  . Drug use: No   Social Drivers of Health   Food Insecurity: No Food Insecurity (06/13/2023)   Received from [facility]   Hunger Vital Sign   . Worried About Programme researcher, broadcasting/film/video in the Last Year: Never true   . Ran Out of Food in the Last Year: Never true  Transportation Needs: Unknown (06/13/2023)   Received from [facility] - Transportation   . Lack of Transportation (Medical): No   [facility]   Social Network    Review of Systems  Respiratory:  Negative for chest tightness and shortness of breath.   Cardiovascular:  Positive for leg swelling.  Gastrointestinal:  Negative for abdominal pain.  All other systems reviewed and are negative.   Physical Exam   BP 137/92   Pulse 96   Temp 36.2 C (97.1 F) (Temporal)   Resp 14   Ht 154.9 cm (5' 1)   Wt 79.1 kg (174 lb 6.4 oz)   SpO2 92%   BMI 32.95 kg/m   Physical Exam  Vital signs have been reviewed. Patient is well-appearing w/o respiratory distress shock or major trauma. HEENT is atraumatic.  Neck shows unimpaired range of motion. Chest No increased work of breathing or audible wheezing. Cardiac Good perfusion throughout. Abdomen  is not distended. Extremities are without significant trauma. Skin no visualized rash.  Edema as noted Neuro exam is grossly non-focal. Psych exam shows normal mood and behavior.  ED Course    Triage and old records reviewed   Medical Decision Making   Triage and old records reviewed complex medication list including oxycodone  trazodone  pulmonary meds gabapentin  Lasix no anticoagulation.  Past medical history notable for the thyroid  problems multiple surgeries.  I do not see a prior history of DVT.  D-dimers have been negative in the past  For PE, years criteria technically not negative due to peripheral edema problems we will plan to go ahead with CT angiogram  this evening.  However patient just had CT scanning of the chest done 4 days ago which was negative for PE.  She has been seen repeatedly for shortness of breath and edema recently with negative workup.  I think the outpatient Doppler would be reasonable at this point   0040 hrs.  Workup essentially is negative for this patient she has have autoimmune disease which may be an explanation for her chronic edema problems were not see any evidence of heart failure liver failure or kidney failure low risk for thromboembolic process will go ahead with Lovenox and follow-up tomorrow for Doppler ultrasound otherwise referral to rheumatology as outpatient would be reasonable I think.  In my best clinical judgment I think the patient unlikely to require hospitalization at this time     Dallara, John James, MD 06/29/23 2248    Dallara, John James, MD 06/29/23 2250    Dallara, John James, MD 06/30/23 0028    Dallara, John James, MD 06/30/23 [phone]

## 2023-06-30 NOTE — ED Notes (Signed)
 ED Procedure Note  EKG Interpretation  Date/Time: 06/30/2023 12:46 AM  Performed by: [place] Norleen Agent, MD Authorized by: [place] Norleen Agent, MD   ECG interpreted by ED Physician in the absence of a cardiologist: yes   Previous ECG:    Previous ECG:  Unavailable Rate:    ECG rate:  90   ECG rate assessment: normal   Rhythm:    Rhythm: sinus rhythm   Ectopy:    Ectopy: none   QRS:    QRS axis:  Normal   QRS intervals:  Normal   QRS conduction: normal   ST segments:    ST segments:  Normal T waves:    T waves: normal   Q waves:    Abnormal Q-waves: not present   Comments:     Sinus rhythm at 90 no acute ST-T wave changes normal intervals

## 2023-07-18 NOTE — ED Triage Notes (Signed)
 Pt reports congestion, diarrhea and body aches for the past few days.

## 2023-07-18 NOTE — ED Provider Notes (Signed)
 ------------------------------------------------------------------------------- Attestation signed by Moody Mar, DO at 07/18/23 1525 Patient was seen by PA or NP and I was not involved in patient's work-up, care plan, treatment or disposition.  I was available for consultation at all points during patient's visit.  -------------------------------------------------------------------------------                                                                                     Emergency Department Provider Note    ED Clinical Impression   Final diagnoses:  Cough, unspecified type (Primary)  Fever, unspecified fever cause  Diarrhea, unspecified type  Myalgia    ED Assessment/Plan    Condition: Stable Disposition: Discharge  This chart has been completed using Dragon Medical Dictation software, and while attempts have been made to ensure accuracy, certain words and phrases may not be transcribed as intended.   History   Chief Complaint  Patient presents with  . Flu Like Symptoms   HPI  Victoria Galvan is a 53 y.o. female presents the emergency department for evaluation of viral syndrome like symptoms.  Patient reports onset beginning approximate x 1 week ago.  She reports symptoms including productive cough, fever Tmax 103, diarrhea, joint aches although notes that this is chronic, shortness of breath.  No known sick contacts.  She denies vomiting or abdominal pain.  No new pain when asked.   Allergies: has no known allergies. Medications: has a current medication list which includes the following long-term medication(s): albuterol , duloxetine , furosemide, levothyroxine , and trazodone . PMHx:  has a past medical history of Anxiety, Cervical disc disease, Disease of thyroid  gland, Fibromyalgia, H/O: hysterectomy, Panic disorder, Rheumatoid arteritis, SLE (systemic lupus erythematosus), and Spondylitis, cervical. PSHx:  has a past surgical history that includes ACDF  (08/2015); Partial hysterectomy; Tonsillectomy; and Hysterectomy. SocHx:  reports that she has been smoking cigarettes. She has a 7.5 pack-year smoking history. She has never used smokeless tobacco. She reports that she does not drink alcohol and does not use drugs. Allergies, Medications, Medical, Surgical, and Social History were reviewed as documented above.   Social Drivers of Health with Concerns   Tobacco Use: High Risk (07/18/2023)   Patient History   . Smoking Tobacco Use: Every Day   . Smokeless Tobacco Use: Never   . Passive Exposure: Not on file  Transportation Needs: Unknown (06/13/2023)   Received from [facility] - Transportation   . Lack of Transportation (Medical): No   . Lack of Transportation (Non-Medical): Not on file  Alcohol Use: Not on file  Housing: Not on file  Physical Activity: Not on file  Stress: Not on file  Interpersonal Safety: Not on file  Substance Use: Not on file (02/27/2023)  Social Connections: Unknown (09/02/2021)   Received from [facility], [facility]   Social Network   . Social Network: Not on Actuary Strain: Not on file  Depression: Not on file  Internet Connectivity: Not on file  Health Literacy: Not on file     Review Of Systems  Review of Systems  Physical Exam   BP 151/98   Pulse 87   Temp 37.2 C (98.9 F) (Oral)  Resp 18   Ht 154.9 cm (5' 1)   Wt 73.6 kg (162 lb 3.2 oz)   SpO2 100%   BMI 30.65 kg/m   Physical Exam Vitals reviewed.  Constitutional:      General: She is not in acute distress.    Appearance: Normal appearance. She is normal weight. She is not ill-appearing or toxic-appearing.  HENT:     Head: Normocephalic.     Mouth/Throat:     Mouth: Mucous membranes are moist.  Eyes:     Extraocular Movements: Extraocular movements intact.     Conjunctiva/sclera: Conjunctivae normal.  Cardiovascular:     Rate and Rhythm: Normal rate and regular rhythm.     Heart sounds: Normal  heart sounds.  Pulmonary:     Effort: Pulmonary effort is normal.     Breath sounds: Normal breath sounds.  Abdominal:     General: Abdomen is flat. There is no distension.     Palpations: Abdomen is soft.     Tenderness: There is no abdominal tenderness. There is no guarding.  Musculoskeletal:     Cervical back: Normal range of motion.  Neurological:     General: No focal deficit present.     Mental Status: She is alert and oriented to person, place, and time.  Psychiatric:        Mood and Affect: Mood normal.        Behavior: Behavior normal.     ED Course  Medical Decision Making Ddx: Shortness of breath, asthma/COPD exacerbation, CHF, arrhythmia, ischemia, ACS/PE; viral syndrome, lower respiratory tract infection  Afebrile, nontoxic-appearing 53 year-old female presents to the emergency department for evaluation of essential like symptoms.  Initial vitals show blood pressure 146/78, temperature 98.9, heart 114, 98% on room air. Physical exam unremarkable for acute findings.  Lungs bilaterally.  Respirations nonlabored, no tachypnea, no respiratory distress. Patient is overall non-toxic, alert and oriented, GCS 15.  Rapid viral testing negative.  Chest x-ray without focal consolidations.  Does seem commensurate with viral syndrome/bronchitis.  Will prescribe symptomatic relief.  Low suspicion for ACS/PE.  Patient given return precautions.  Tachycardia resolved without intervention.  Encouraged continued hydration at home.  Discussed lab results and imaging findings with patient who is agreeable to discharge at this time.  Discussed at home symptomatic treatment as well as appropriate follow-up.  Discussed symptoms warranting return to the emergency department.  Patient advised return for new or worsening symptoms.  I have personally reviewed and interpreted the images ordered.    Amount and/or Complexity of Data Reviewed Labs: ordered. Decision-making details documented in ED  Course. Radiology: ordered and independent interpretation performed. Decision-making details documented in ED Course.     Procedures   No results found for this visit on 07/18/23 (from the past 4464 hours).  ED Course as of 07/18/23 1434  Wed Jul 18, 2023  1307 Chest x-ray without acute cardiopulmonary pathology.  1354 Rapid viral testing negative.     ED Results Results for orders placed or performed during the hospital encounter of 07/18/23  Influenza/ RSV/COVID PCR   Specimen: Nasopharyngeal Swab  Result Value Ref Range   SARS-CoV-2 PCR Negative Negative   Influenza A Negative Negative   Influenza B Negative Negative   RSV Negative Negative   XR Chest 2 views Result Date: 07/18/2023 Exam:  Chest Two Views History:  SHORTNESS OF BREATH Technique:  2 views Comparison:  06/30/2023 Findings:  Cardiomediastinal contours are stable. Clear lungs. No pleural effusion or pneumothorax.  Mild thoracic spondylosis with slight dextrocurvature. Status post ACDF.   No acute cardiopulmonary findings. Signed (Electronic Signature): 07/18/2023 1:00 PM Signed By: Carlin Essex, MD   Medications Administered: Medications - No data to display  Discharge Medications (Medications Prescribed during this  ED visit and Patient's Home Medications) :    Your Medication List     STOP taking these medications    predniSONE  20 MG tablet Commonly known as: DELTASONE        START taking these medications    benzonatate  100 MG capsule Commonly known as: TESSALON  Take 1 capsule (100 mg total) by mouth every eight (8) hours for 7 days.   loperamide  2 mg capsule Commonly known as: IMODIUM  Take 1 capsule (2 mg total) by mouth two (2) times a day as needed for diarrhea for up to 3 days.   methylPREDNISolone 4 mg tablet Commonly known as: MEDROL DOSEPACK follow package directions       CHANGE how you take these medications    albuterol  90 mcg/actuation inhaler Commonly known as: PROVENTIL   HFA;VENTOLIN  HFA Inhale 2 puffs every six (6) hours as needed for wheezing for up to 14 days. What changed:  when to take this reasons to take this       ASK your doctor about these medications    baclofen 10 MG tablet Commonly known as: LIORESAL   DULoxetine  20 MG capsule Commonly known as: CYMBALTA  Take 2 capsules (40 mg total) by mouth daily.   furosemide 20 MG tablet Commonly known as: LASIX TAKE 1 TABLET DAILY   hydrOXYzine  50 MG tablet Commonly known as: ATARAX  TAKE 1 TO 2 TABLETS DAILY AS NEEDED FOR ANXIETY No more RFs without a follow up appointment   ibuprofen 800 MG tablet Commonly known as: MOTRIN Take 1 tablet (800 mg total) by mouth every eight (8) hours as needed for pain.   ibuprofen 400 MG tablet Commonly known as: MOTRIN Take 1 tablet (400 mg total) by mouth every six (6) hours as needed for pain.   levothyroxine  50 MCG tablet Commonly known as: SYNTHROID  Take 1 tablet (50 mcg total) by mouth daily.   methocarbamol  500 MG tablet Commonly known as: ROBAXIN  Take 1 tablet (500 mg total) by mouth two (2) times a day for 10 days. Ask about: Should I take this medication?   oxyCODONE  10 mg immediate release tablet Commonly known as: ROXICODONE  Take 10 mg by mouth Every four (4) hours.   oxyCODONE  18 mg Cspt 12 hr ER capsule sprinkle Commonly known as: XTAMPZA  ER Take 18 mg by mouth every twelve (12) hours.   traZODone  100 MG tablet Commonly known as: DESYREL  Take 1 tablet (100 mg total) by mouth nightly.          Kopel, Andrew Lee, [place] 07/18/23 1434

## 2023-10-23 ENCOUNTER — Ambulatory Visit (HOSPITAL_COMMUNITY)
Admission: EM | Admit: 2023-10-23 | Discharge: 2023-10-23 | Disposition: A | Discharge status: Other Acute Inpatient Hospital | Payer: Self-pay | Attending: Psychiatry | Admitting: Psychiatry

## 2023-10-23 ENCOUNTER — Other Ambulatory Visit (HOSPITAL_COMMUNITY)
Admission: EM | Admit: 2023-10-23 | Discharge: 2023-10-27 | Disposition: A | Discharge status: Home / Self Care | Payer: Self-pay | Source: Intra-hospital | Attending: Psychiatry | Admitting: Psychiatry

## 2023-10-23 DIAGNOSIS — Z5986 Financial insecurity: Secondary | ICD-10-CM | POA: Insufficient documentation

## 2023-10-23 DIAGNOSIS — Z9151 Personal history of suicidal behavior: Secondary | ICD-10-CM | POA: Insufficient documentation

## 2023-10-23 DIAGNOSIS — Z634 Disappearance and death of family member: Secondary | ICD-10-CM | POA: Insufficient documentation

## 2023-10-23 DIAGNOSIS — F1124 Opioid dependence with opioid-induced mood disorder: Secondary | ICD-10-CM | POA: Insufficient documentation

## 2023-10-23 DIAGNOSIS — F1123 Opioid dependence with withdrawal: Secondary | ICD-10-CM | POA: Insufficient documentation

## 2023-10-23 DIAGNOSIS — E039 Hypothyroidism, unspecified: Secondary | ICD-10-CM | POA: Insufficient documentation

## 2023-10-23 DIAGNOSIS — G4709 Other insomnia: Secondary | ICD-10-CM | POA: Insufficient documentation

## 2023-10-23 DIAGNOSIS — F1114 Opioid abuse with opioid-induced mood disorder: Secondary | ICD-10-CM

## 2023-10-23 DIAGNOSIS — M797 Fibromyalgia: Secondary | ICD-10-CM | POA: Insufficient documentation

## 2023-10-23 DIAGNOSIS — F1193 Opioid use, unspecified with withdrawal: Secondary | ICD-10-CM

## 2023-10-23 DIAGNOSIS — F1190 Opioid use, unspecified, uncomplicated: Secondary | ICD-10-CM

## 2023-10-23 DIAGNOSIS — Z79890 Hormone replacement therapy (postmenopausal): Secondary | ICD-10-CM | POA: Insufficient documentation

## 2023-10-23 DIAGNOSIS — F332 Major depressive disorder, recurrent severe without psychotic features: Secondary | ICD-10-CM

## 2023-10-23 DIAGNOSIS — F1994 Other psychoactive substance use, unspecified with psychoactive substance-induced mood disorder: Secondary | ICD-10-CM

## 2023-10-23 DIAGNOSIS — M791 Myalgia, unspecified site: Secondary | ICD-10-CM

## 2023-10-23 DIAGNOSIS — F1120 Opioid dependence, uncomplicated: Secondary | ICD-10-CM

## 2023-10-23 DIAGNOSIS — F321 Major depressive disorder, single episode, moderate: Secondary | ICD-10-CM

## 2023-10-23 DIAGNOSIS — F319 Bipolar disorder, unspecified: Secondary | ICD-10-CM | POA: Insufficient documentation

## 2023-10-23 DIAGNOSIS — F411 Generalized anxiety disorder: Secondary | ICD-10-CM | POA: Insufficient documentation

## 2023-10-23 DIAGNOSIS — Z560 Unemployment, unspecified: Secondary | ICD-10-CM | POA: Insufficient documentation

## 2023-10-23 DIAGNOSIS — Z5971 Insufficient health insurance coverage: Secondary | ICD-10-CM | POA: Insufficient documentation

## 2023-10-23 DIAGNOSIS — Z79899 Other long term (current) drug therapy: Secondary | ICD-10-CM | POA: Insufficient documentation

## 2023-10-23 DIAGNOSIS — G4700 Insomnia, unspecified: Secondary | ICD-10-CM | POA: Insufficient documentation

## 2023-10-23 DIAGNOSIS — R45851 Suicidal ideations: Secondary | ICD-10-CM | POA: Insufficient documentation

## 2023-10-23 DIAGNOSIS — M7918 Myalgia, other site: Secondary | ICD-10-CM | POA: Insufficient documentation

## 2023-10-23 LAB — COMPREHENSIVE METABOLIC PANEL WITH GFR
ALT: 14 U/L (ref 0–44)
AST: 15 U/L (ref 15–41)
Albumin: 4.3 g/dL (ref 3.5–5.0)
Alkaline Phosphatase: 128 U/L — ABNORMAL HIGH (ref 38–126)
Anion gap: 12 (ref 5–15)
BUN: 10 mg/dL (ref 6–20)
CO2: 25 mmol/L (ref 22–32)
Calcium: 10 mg/dL (ref 8.9–10.3)
Chloride: 101 mmol/L (ref 98–111)
Creatinine, Ser: 0.88 mg/dL (ref 0.44–1.00)
GFR, Estimated: >60 mL/min (ref >60)
Glucose, Bld: 106 mg/dL — ABNORMAL HIGH (ref 70–99)
Potassium: 4 mmol/L (ref 3.5–5.1)
Sodium: 138 mmol/L (ref 135–145)
Total Bilirubin: 0.7 mg/dL (ref 0.0–1.2)
Total Protein: 6.8 g/dL (ref 6.5–8.1)

## 2023-10-23 LAB — CBC WITH DIFFERENTIAL/PLATELET
Abs Immature Granulocytes: 0.02 10*3/uL (ref 0.00–0.07)
Basophils Absolute: 0.1 10*3/uL (ref 0.0–0.1)
Basophils Relative: 1 %
Eosinophils Absolute: 0.1 10*3/uL (ref 0.0–0.5)
Eosinophils Relative: 1 %
HCT: 40.8 % (ref 36.0–46.0)
Hemoglobin: 14.1 g/dL (ref 12.0–15.0)
Immature Granulocytes: 0 %
Lymphocytes Relative: 18 %
Lymphs Abs: 1.6 10*3/uL (ref 0.7–4.0)
MCH: 30.2 pg (ref 26.0–34.0)
MCHC: 34.6 g/dL (ref 30.0–36.0)
MCV: 87.4 fL (ref 80.0–100.0)
Monocytes Absolute: 0.6 10*3/uL (ref 0.1–1.0)
Monocytes Relative: 7 %
Neutro Abs: 6.3 10*3/uL (ref 1.7–7.7)
Neutrophils Relative %: 73 %
Platelets: 279 10*3/uL (ref 150–400)
RBC: 4.67 MIL/uL (ref 3.87–5.11)
RDW: 12.1 % (ref 11.5–15.5)
WBC: 8.7 10*3/uL (ref 4.0–10.5)
nRBC: 0 % (ref 0.0–0.2)

## 2023-10-23 LAB — LIPID PANEL
Cholesterol: 263 mg/dL — ABNORMAL HIGH (ref 0–200)
HDL: 35 mg/dL — ABNORMAL LOW (ref >40)
LDL Cholesterol: 165 mg/dL — ABNORMAL HIGH (ref 0–99)
Total CHOL/HDL Ratio: 7.5 ratio
Triglycerides: 314 mg/dL — ABNORMAL HIGH (ref <150)
VLDL: 63 mg/dL — ABNORMAL HIGH (ref 0–40)

## 2023-10-23 LAB — POCT URINE DRUG SCREEN - MANUAL ENTRY (I-SCREEN)
POC Amphetamine UR: NOT DETECTED
POC Buprenorphine (BUP): NOT DETECTED
POC Cocaine UR: NOT DETECTED
POC Marijuana UR: NOT DETECTED
POC Methadone UR: NOT DETECTED
POC Methamphetamine UR: NOT DETECTED
POC Morphine: NOT DETECTED
POC Oxazepam (BZO): POSITIVE — AB
POC Oxycodone UR: NOT DETECTED
POC Secobarbital (BAR): NOT DETECTED

## 2023-10-23 LAB — TSH: TSH: 7.886 u[IU]/mL — ABNORMAL HIGH (ref 0.350–4.500)

## 2023-10-23 LAB — POC URINE PREG, ED: Preg Test, Ur: NEGATIVE

## 2023-10-23 LAB — HEMOGLOBIN A1C
Hgb A1c MFr Bld: 4.9 % (ref 4.8–5.6)
Mean Plasma Glucose: 93.93 mg/dL

## 2023-10-23 LAB — ETHANOL: Alcohol, Ethyl (B): <15 mg/dL (ref <15)

## 2023-10-23 MED ORDER — MAGNESIUM HYDROXIDE 400 MG/5ML PO SUSP: 30.0000 mL | Freq: Every day | ORAL | Status: DC | PRN

## 2023-10-23 MED ORDER — ALUM & MAG HYDROXIDE-SIMETH 200-200-20 MG/5ML PO SUSP: 30.0000 mL | ORAL | Status: DC | PRN

## 2023-10-23 MED ORDER — OLANZAPINE 10 MG IM SOLR: 5.0000 mg | Freq: Three times a day (TID) | INTRAMUSCULAR | Status: DC | PRN

## 2023-10-23 MED ORDER — LEVOTHYROXINE SODIUM 25 MCG PO TABS
50.0000 ug | ORAL_TABLET | Freq: Every day | ORAL | Status: DC
  Administered 2023-10-24 – 2023-10-27 (×4): 50 ug via ORAL
  Filled 2023-10-23: qty 14
  Filled 2023-10-23 (×4): qty 2
  Filled 2023-10-23: qty 7

## 2023-10-23 MED ORDER — HYDROXYZINE HCL 25 MG PO TABS
25.0000 mg | ORAL_TABLET | Freq: Three times a day (TID) | ORAL | Status: DC | PRN
  Administered 2023-10-23: 25 mg via ORAL
  Filled 2023-10-23: qty 1

## 2023-10-23 MED ORDER — CLONIDINE HCL 0.1 MG PO TABS
0.1000 mg | ORAL_TABLET | Freq: Four times a day (QID) | ORAL | Status: AC
  Administered 2023-10-23 – 2023-10-25 (×8): 0.1 mg via ORAL
  Filled 2023-10-23 (×8): qty 1

## 2023-10-23 MED ORDER — LEVOTHYROXINE SODIUM 25 MCG PO TABS: 50.0000 ug | ORAL_TABLET | Freq: Once | ORAL | Status: DC

## 2023-10-23 MED ORDER — CLONIDINE HCL 0.1 MG PO TABS
0.1000 mg | ORAL_TABLET | Freq: Four times a day (QID) | ORAL | Status: DC
  Administered 2023-10-23: 0.1 mg via ORAL
  Filled 2023-10-23: qty 1

## 2023-10-23 MED ORDER — NICOTINE 21 MG/24HR TD PT24: 21.0000 mg | MEDICATED_PATCH | Freq: Every day | TRANSDERMAL | Status: DC

## 2023-10-23 MED ORDER — ACETAMINOPHEN 325 MG PO TABS: 650.0000 mg | ORAL_TABLET | Freq: Four times a day (QID) | ORAL | Status: DC | PRN

## 2023-10-23 MED ORDER — OLANZAPINE 5 MG PO TBDP: 5.0000 mg | ORAL_TABLET | Freq: Three times a day (TID) | ORAL | Status: DC | PRN

## 2023-10-23 MED ORDER — DIPHENHYDRAMINE HCL 50 MG PO CAPS
50.0000 mg | ORAL_CAPSULE | Freq: Four times a day (QID) | ORAL | Status: DC | PRN
  Administered 2023-10-23 – 2023-10-25 (×2): 50 mg via ORAL
  Filled 2023-10-23 (×2): qty 1

## 2023-10-23 MED ORDER — ONDANSETRON 4 MG PO TBDP
4.0000 mg | ORAL_TABLET | Freq: Four times a day (QID) | ORAL | Status: DC | PRN
  Administered 2023-10-23 – 2023-10-25 (×3): 4 mg via ORAL
  Filled 2023-10-23 (×3): qty 1

## 2023-10-23 MED ORDER — ALUM & MAG HYDROXIDE-SIMETH 200-200-20 MG/5ML PO SUSP
30.0000 mL | ORAL | Status: DC | PRN
  Administered 2023-10-26: 30 mL via ORAL
  Filled 2023-10-23: qty 30

## 2023-10-23 MED ORDER — CLONIDINE HCL 0.1 MG PO TABS: 0.1000 mg | ORAL_TABLET | Freq: Every day | ORAL | Status: DC

## 2023-10-23 MED ORDER — HYDROXYZINE HCL 25 MG PO TABS
25.0000 mg | ORAL_TABLET | Freq: Four times a day (QID) | ORAL | Status: DC | PRN
  Administered 2023-10-23 – 2023-10-27 (×5): 25 mg via ORAL
  Filled 2023-10-23 (×5): qty 1
  Filled 2023-10-23: qty 20

## 2023-10-23 MED ORDER — QUETIAPINE FUMARATE 50 MG PO TABS
50.0000 mg | ORAL_TABLET | Freq: Every day | ORAL | Status: DC
  Administered 2023-10-24: 50 mg via ORAL
  Filled 2023-10-23: qty 1

## 2023-10-23 MED ORDER — CLONIDINE HCL 0.1 MG PO TABS: 0.1000 mg | ORAL_TABLET | ORAL | Status: DC

## 2023-10-23 MED ORDER — NAPROXEN 500 MG PO TABS
500.0000 mg | ORAL_TABLET | Freq: Two times a day (BID) | ORAL | Status: DC | PRN
  Administered 2023-10-24: 500 mg via ORAL
  Filled 2023-10-23: qty 10
  Filled 2023-10-23 (×3): qty 1

## 2023-10-23 MED ORDER — NICOTINE 21 MG/24HR TD PT24
21.0000 mg | MEDICATED_PATCH | Freq: Every day | TRANSDERMAL | Status: DC
  Administered 2023-10-24: 21 mg via TRANSDERMAL
  Filled 2023-10-23 (×2): qty 1

## 2023-10-23 MED ORDER — TRAZODONE HCL 50 MG PO TABS: 50.0000 mg | ORAL_TABLET | Freq: Every evening | ORAL | Status: DC | PRN

## 2023-10-23 MED ORDER — DIPHENHYDRAMINE HCL 50 MG PO CAPS: 50.0000 mg | ORAL_CAPSULE | Freq: Four times a day (QID) | ORAL | Status: DC | PRN

## 2023-10-23 MED ORDER — DICYCLOMINE HCL 20 MG PO TABS: 20.0000 mg | ORAL_TABLET | Freq: Four times a day (QID) | ORAL | Status: DC | PRN

## 2023-10-23 MED ORDER — ONDANSETRON 4 MG PO TBDP
4.0000 mg | ORAL_TABLET | Freq: Four times a day (QID) | ORAL | Status: DC | PRN
  Administered 2023-10-23: 4 mg via ORAL
  Filled 2023-10-23: qty 1

## 2023-10-23 MED ORDER — METHOCARBAMOL 500 MG PO TABS
500.0000 mg | ORAL_TABLET | Freq: Three times a day (TID) | ORAL | Status: DC | PRN
  Administered 2023-10-23 – 2023-10-25 (×3): 500 mg via ORAL
  Filled 2023-10-23 (×3): qty 1

## 2023-10-23 MED ORDER — METHOCARBAMOL 500 MG PO TABS: 500.0000 mg | ORAL_TABLET | Freq: Three times a day (TID) | ORAL | Status: DC | PRN

## 2023-10-23 MED ORDER — DICYCLOMINE HCL 20 MG PO TABS
20.0000 mg | ORAL_TABLET | Freq: Four times a day (QID) | ORAL | Status: DC | PRN
  Administered 2023-10-24 – 2023-10-26 (×3): 20 mg via ORAL
  Filled 2023-10-23 (×3): qty 1

## 2023-10-23 MED ORDER — NAPROXEN 500 MG PO TABS
500.0000 mg | ORAL_TABLET | Freq: Two times a day (BID) | ORAL | Status: DC | PRN
  Administered 2023-10-23: 500 mg via ORAL
  Filled 2023-10-23: qty 1

## 2023-10-23 MED ORDER — LOPERAMIDE HCL 2 MG PO CAPS
2.0000 mg | ORAL_CAPSULE | ORAL | Status: DC | PRN
  Administered 2023-10-24 – 2023-10-25 (×3): 2 mg via ORAL
  Administered 2023-10-26: 4 mg via ORAL
  Administered 2023-10-26 – 2023-10-27 (×2): 2 mg via ORAL
  Filled 2023-10-23 (×3): qty 1
  Filled 2023-10-23 (×2): qty 2
  Filled 2023-10-23 (×2): qty 1

## 2023-10-23 MED ORDER — LOPERAMIDE HCL 2 MG PO CAPS
2.0000 mg | ORAL_CAPSULE | ORAL | Status: DC | PRN
  Administered 2023-10-23: 4 mg via ORAL
  Filled 2023-10-23: qty 2

## 2023-10-23 MED ORDER — CLONIDINE HCL 0.1 MG PO TABS
0.1000 mg | ORAL_TABLET | ORAL | Status: AC
  Administered 2023-10-25 – 2023-10-27 (×4): 0.1 mg via ORAL
  Filled 2023-10-23 (×4): qty 1

## 2023-10-23 MED ORDER — TRAZODONE HCL 50 MG PO TABS
50.0000 mg | ORAL_TABLET | Freq: Every evening | ORAL | Status: DC | PRN
  Administered 2023-10-23: 50 mg via ORAL
  Filled 2023-10-23: qty 1

## 2023-10-23 MED ORDER — HYDROXYZINE HCL 25 MG PO TABS: 25.0000 mg | ORAL_TABLET | Freq: Four times a day (QID) | ORAL | Status: DC | PRN

## 2023-10-23 NOTE — Progress Notes (Signed)
   10/23/23 1107  [facility] Triage Screening (Walk-ins at [facility] only)  How Did You Hear About Us ? Self  What Is the Reason for Your Visit/Call Today? Victoria Galvan presents to [facility] voluntarily unaccompanied. Pt states that she would like to detox from opiods. Pt shares that the withdrawals are so bad that she tries to kill herself. Pt shares that she has been praying to God that he take her life. Pt states that she was having SI thoughts during our triage. Pt states that she has a plan but doesn't want to share. This writer was unable to complete triage with pt because pt stated that she had to be careful about what she said because she could be made to stay in a place like this.  How Long Has This Been Causing You Problems? > than 6 months  Have You Recently Had Any Thoughts About Hurting Yourself? Yes  How long ago did you have thoughts about hurting yourself? today - has a plan but doesn't want to share  Are You Planning to Commit Suicide/Harm Yourself At This time?  (UTA)  Have you Recently Had Thoughts About Hurting Someone Else?  (UTA)  Are You Planning To Harm Someone At This Time?  (UTA)  Physical Abuse  (UTA)  Verbal Abuse  (UTA)  Sexual Abuse  (UTA)  Exploitation of patient/patient's resources  (UTA)  Self-Neglect  (UTA)  Are you currently experiencing any auditory, visual or other hallucinations?  (UTA)  Have You Used Any Alcohol or Drugs in the Past 24 Hours?  (UTA)  Do you have any current medical co-morbidities that require immediate attention?  (UTA)  Clinician description of patient physical appearance/behavior: tearful, thought blocking  What Do You Feel Would Help You the Most Today? Alcohol or Drug Use Treatment  If access to [facility] was not available, would you have sought care in the Emergency Department?  (UTA)  Determination of Need Urgent (48 hours)  Options For Referral Facility-Based Crisis;BH Urgent Care;Medication Management  Determination of Need filed?  Yes

## 2023-10-23 NOTE — ED Notes (Signed)
 Patient transferred to [facility]. Patient escorted to [facility] unit by staff. Safety maintained.

## 2023-10-23 NOTE — ED Notes (Signed)
 Pt arrived to [facility] bed 163 from OBS with c/o of opiate withdrawal. Pt stated she did not want to be here.  She was here before, and the treatment/meds did not work and she went  back out and used as soon as she left.  Pt wanted to go to [facility] instead. Pt reports WD symptoms of nausea, diarrhea, hot and cold sensations, restless cramping legs, anxiety, runny nose and irritability. When completing the CSSR, pt stated, I would rather be dead than to feel like this. Pt did not endorse specific plan or intent. Q 15 minute observations for safety in place

## 2023-10-23 NOTE — ED Provider Notes (Signed)
 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 She reports a psychiatric history of bipolar disorder and depression. The patient states that about 5-7 years ago, she was diagnosed with fibromyalgia and mixed connective tissue disease and was prescribed opioids, specifically Roxicodone , about 30-40 mg every 4 hours as needed daily. She stated that when this prescription was discontinued, she turned to using illicit substances. She reports a history of heroin use and is currently using fentanyl via insufflation, 2-3 grams daily, with her last use around 11 pm yesterday. She reports attempting to get off opioids; otherwise, she would have used several times today. She also reports withdrawal symptoms, including rhinorrhea, stomach cramps, hot and cold sweats, and generalized body aches.  Victoria Galvan, is seen face to face by this provider, consulted with Dr. Cole; and chart reviewed on 10/23/23.  On evaluation Victoria Galvan reports history of bipolar disorder and depression. She reports losing her younger sister and her sister's son a few years ago due to overdose from illicit substance use. She states that she has never addressed this through therapy. She also reports having no medical insurance. She reports history of SI attempt several years ago by cutting her wrist where the intent was to continue bleeding. She reports visual hallucations when she is not sleeping, which  occurred once about several months ago during withdrawals. She reports history of a little trauma but is not ready to discuss it. She reports sleeping about 5-6 hours nightly when she's using, but when she's not using illicit substances, she sleeps about 40 mins intervals. She reports when using she eats about once daily and when sis not using she does not eat or drink. She reports being married and having been with her husband since they were younger. Her husband is unaware that she is struggling with illicit substance use; he only knows that she has depression and bipolar disorder. 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 She denies access to firearms but states that if she did have one, "that would be quick." She reports suicidal ideation (SI) without a plan when using substances and denies any history of homicidal ideation (HI). She is currently taking levothyroxine  and Seroquel  for sleep. She has a primary care provider (PCP) in [place], possibly at [facility], although she cannot recall the exact name. She reports her last PCP visit was less than three months ago, and the PCP prescribes her Seroquel .  During evaluation Victoria Galvan is sitting in the chair with her hands on her head during this assessment, reporting withdrawal symptoms including rhinorrhea, stomach cramps, hot and cold sweats, and generalized body aches. She is alert and oriented x 4, calm but a bit irritable due to withdrawal symptoms, yet cooperative and attentive throughout the assessment. Her mood is anxious and irritable, with a euthymic and congruent affect. She has normal speech and behavior. Objectively, there is no evidence  of psychosis, mania, or delusional thinking. The patient does not appear to be responding to internal or external stimuli. Patient is able to converse coherently, goal directed thoughts, no distractibility, or pre-occupation. She  reports suicidal/self-harm during withdrawal symptoms and reporting that having access to a firearm  will be quick. Denies psychosis, and paranoia.  Patient answered question appropriately.     Flowsheet Row ED from 10/23/2023 in [facility] ED from 06/13/2023 in [facility] ED from 06/12/2023 in [facility] Emergency Department at [facility]  C-SSRS RISK CATEGORY High Risk High Risk High Risk    Psychiatric Specialty Exam  Presentation  General Appearance:Appropriate for Environment  Eye Contact:Minimal  Speech:Clear and Coherent; Normal Rate  Speech Volume:Normal  Handedness:Right   Mood and Affect  Mood: Irritable; Anxious  Affect: Congruent   Thought Process  Thought Processes: Coherent; Goal Directed  Descriptions of Associations:Intact  Orientation:Full (Time, Place and Person)  Thought Content:Logical    Hallucinations:None  Ideas of Reference:None  Suicidal Thoughts:Yes, Passive With Intent; Without Access to Means; Without Means to Carry Out; Without Plan Without Plan; Without Intent  Homicidal Thoughts:No   Sensorium  Memory: Immediate Good; Recent Good  Judgment: Fair  Insight: Fair   Executive Functions  Concentration: Good  Attention Span: Fair  Recall: Good  Fund of Knowledge: Fair  Language: Good   Psychomotor Activity  Psychomotor Activity: Restlessness   Assets  Assets: Housing; Manufacturing systems engineer; Intimacy; Desire for Improvement   Sleep  Sleep: Fair  Number of hours:  5   Physical Exam: Physical Exam Vitals and nursing note reviewed.  Constitutional:      Appearance: Normal appearance.  HENT:     Head: Normocephalic.     Nose: Rhinorrhea present. No congestion.     Mouth/Throat:     Mouth: Mucous membranes are moist.  Pulmonary:     Effort: Pulmonary effort is normal.   Musculoskeletal:        General: Normal range of motion.      Cervical back: Normal range of motion.   Skin:    General: Skin is dry.   Neurological:     Mental Status: She is alert and oriented to person, place, and time.   Psychiatric:        Attention and Perception: Attention and perception normal.        Mood and Affect: Mood is anxious.        Speech: Speech normal.        Behavior: Behavior normal. Behavior is cooperative.        Thought Content: Thought content includes suicidal ideation.        Cognition and Memory: Cognition and memory normal.        Judgment: Judgment normal.    Review of Systems  Psychiatric/Behavioral:  Positive for substance abuse and suicidal ideas. The patient is nervous/anxious and has insomnia.   All other systems reviewed and are negative.   Blood pressure (!) 149/96, pulse 70, temperature 97.9 F (36.6 C), temperature source Oral, resp. rate 18, SpO2 100%. There is no height or weight on file to calculate BMI.  Musculoskeletal: Strength & Muscle Tone: within normal limits Gait & Station: normal Patient leans: Front   [facility] MSE Discharge Disposition for Follow up and Recommendations: Based on my evaluation I certify that psychiatric inpatient services furnished can reasonably be expected to improve the patient's condition which I recommend transfer to an appropriate accepting facility.  Pt initially stated she didn't want to come to [facility], however there are no beds available at [facility] and pt is agreeable to [facility]. She was made aware of the medications that are available to help with her withdrawal symptoms.   We discussed the possibility of exploring a Medication-Assisted Treatment (MAT) program or a long-term residential facility.  Basic labs ordered and pending: CBC, CMP, LIPID, HA1C, UDS, Ethanol, EKG, Vit D, Prolactin. Admission ordered entered including COWS protocol.  PRNS -Continue Tylenol  650 mg every 6 hours PRN for mild pain -Continue Maalox 30 mg every 4 hrs PRN for indigestion -Continue  Milk of Magnesia as needed every 6 hrs for constipation -Clonidine  0.1mg  QID PRN - W/D Sx -Bentyl  20mg  Q6HRS PRN - W/D Sx -Hyroxyzine 25mg  TID PRN - Anxiety -Imodium  2-4mg  TID PRN - Diarrhea -Robaxin  500mg  Q8HRS PRN- Muscle relaxer -Trazodone  50mg  at bedtime PRN -Sleep  Continue Home Meds: Seroquel  50mg  QHS-GAD/Insomnia   Discharge Planning: Social work and case management to assist with discharge planning and identification of hospital follow-up needs prior to discharge Estimated LOS: 5-7 days Discharge Concerns: Need to establish a safety plan; Medication compliance and effectiveness Discharge Goals: Return home with outpatient referrals for mental health follow-up including medication management/psychotherapy; MAT program.    Tosin Yulissa Needham, NP 10/23/2023, 1:50 PM

## 2023-10-23 NOTE — Discharge Instructions (Signed)
Admitted to [facility].

## 2023-10-23 NOTE — ED Notes (Signed)
 Patient resting in lounger with eyes closed, respirations even and unlabored. Patient in no apparent acute distress. Environment secured. Safety checks in place per facility protocol.
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
## 2023-10-23 NOTE — ED Notes (Signed)
 Patient admitted to continuous observation unit d/t SI and requesting detox. Calm, cooperative, intermittently irritable throughout interview process. Skin assessment completed. Oriented to unit. Meal and drink offered, denied due to nausea. Patient alert & oriented x4. Patient endorses active SI, states if anyone says they don't have a plan they're lying. Patient will not disclose any plan, patient endorses intent initially stating well I'm here aren't I? Later retracting and going well its not intent but if you were to put 2 bullets in my head I'd smile. Patient also stated I'm going through detox - of course I'd rather be dead right now .Patient reports nausea, diarrhea, generalized aches/pains, and anxiety. Patient has visible piloerection on arms, chest and back. Prn medications as well as scheduled clonidine  administered with no complications. No acute distress noted. Support and encouragement provided. Routine safety checks conducted per facility protocol. Encouraged patient to notify staff if any thoughts of harm towards self or others arise. Patient verbalizes understanding and agreement.

## 2023-10-23 NOTE — Group Note (Signed)
 Group Topic: Relapse and Recovery  Group Date: 10/23/2023 Start Time: 1930 End Time: 2040 Facilitators: Joan Plowman B  Department: [facility]  Number of Participants: 5  Group Focus: abuse issues, chemical dependency education, chemical dependency issues, communication, community group, coping skills, daily focus, diagnosis education, and dual diagnosis Treatment Modality:  Exposure Therapy, Leisure Development, and Psychoeducation Interventions utilized were group exercise, leisure development, problem solving, story telling, and support Purpose: enhance coping skills, express feelings, improve communication skills, increase insight, regain self-worth, reinforce self-care, relapse prevention strategies, and trigger / craving management  Name: Victoria Galvan Date of Birth: 10-30-1970  MR: [id number]    Level of Participation: PT DID NOT ATTEND GROUP Quality of Participation: cooperative Interactions with others: gave feedback Mood/Affect: appropriate Triggers (if applicable): NA Cognition: coherent/clear Progress: None Response: Pt was in the bed with withdrawals Plan: patient will be encouraged to go to groups when she feels better.   Patients Problems:  Patient Active Problem List   Diagnosis Date Noted   Opioid dependence with opioid-induced mood disorder (HCC) 10/23/2023   Fentanyl use disorder, severe (HCC) 06/13/2023   Mixed connective tissue disease (HCC) 06/13/2023   Substance induced mood disorder (HCC) 06/13/2023   High triglycerides 03/21/2017   Tobacco use disorder 03/21/2017   Chronic pain syndrome 03/21/2017   Major depressive disorder, recurrent, severe with psychotic features (HCC) 03/12/2017   Suicidal ideation    MDD (major depressive disorder), recurrent episode, severe (HCC) 05/03/2016   GAD (generalized anxiety disorder) 05/13/2015   Myalgia 08/07/2014   Positive ANA (antinuclear antibody) 08/07/2014   Hypothyroidism 08/07/2014

## 2023-10-23 NOTE — ED Notes (Signed)
 Patient observed/assessed in patient room lying down asleep in bed. Patient aroused with verbal stimulation after several attempts to which patient responded in an agitated/annoyed demeanor. Patient oriented x 3. Affect is flat/agitated and eye contact is minimal. Patient evasive to preliminary assessment questions. Denies S/I and H/I. Denies A/V/H and stated  all I want is something stronger to help me.

## 2023-10-23 NOTE — ED Notes (Signed)
 Patient is sleeping. Respirations equal and unlabored. No change in assessment or acuity. Routine safety checks conducted according to facility protocol.

## 2023-10-24 LAB — PROLACTIN: Prolactin: 3 ng/mL — ABNORMAL LOW (ref 3.6–25.2)

## 2023-10-24 MED ORDER — TRAZODONE HCL 100 MG PO TABS: 100.0000 mg | ORAL_TABLET | Freq: Every evening | ORAL | Status: DC | PRN

## 2023-10-24 MED ORDER — METHOCARBAMOL 500 MG PO TABS: 1000.0000 mg | ORAL_TABLET | Freq: Once | ORAL | Status: DC

## 2023-10-24 MED ORDER — QUETIAPINE FUMARATE 100 MG PO TABS: 100.0000 mg | ORAL_TABLET | Freq: Once | ORAL | Status: DC

## 2023-10-24 MED ORDER — TRAZODONE HCL 100 MG PO TABS
100.0000 mg | ORAL_TABLET | Freq: Every day | ORAL | Status: DC
  Administered 2023-10-24 – 2023-10-26 (×3): 100 mg via ORAL
  Filled 2023-10-24: qty 1
  Filled 2023-10-24: qty 7
  Filled 2023-10-24 (×2): qty 1

## 2023-10-24 MED ORDER — NAPROXEN 500 MG PO TABS
500.0000 mg | ORAL_TABLET | Freq: Once | ORAL | Status: AC
  Administered 2023-10-25: 500 mg via ORAL
  Filled 2023-10-24: qty 1

## 2023-10-24 MED ORDER — METHOCARBAMOL 500 MG PO TABS
500.0000 mg | ORAL_TABLET | Freq: Once | ORAL | Status: AC
  Administered 2023-10-24: 500 mg via ORAL
  Filled 2023-10-24: qty 1

## 2023-10-24 NOTE — ED Notes (Signed)
 Patient is in bed resting.  She continues to be irritable and is experiencing opiate withdrawal symptoms.  Comfort meds given.  Will monitor.

## 2023-10-24 NOTE — Group Note (Signed)
 Group Topic: Social Support  Group Date: 10/24/2023 Start Time: 1700 End Time: 1715 Facilitators: Celinda Suzen NOVAK, NT  Department: [facility]  Number of Participants: 9  Group Focus: Support Treatment Modality:  Psychoeducation Interventions utilized were support Purpose: express feelings  Name: Victoria Galvan Date of Birth: 08/12/1970  MR: [id number]    Level of Participation: PT did not participate Quality of Participation: withdrawn Interactions with others: No interaction Mood/Affect: uninterested Triggers (if applicable): n/a Cognition: coherent/clear Progress: Other Response: PT seemed anxious about not having clothes/shower, MHT assured PT she would be taken care of during her stay. Plan: patient will be encouraged to attend group  Patients Problems:  Patient Active Problem List   Diagnosis Date Noted   Opioid dependence with opioid-induced mood disorder (HCC) 10/23/2023   Fentanyl use disorder, severe (HCC) 06/13/2023   Mixed connective tissue disease (HCC) 06/13/2023   Substance induced mood disorder (HCC) 06/13/2023   High triglycerides 03/21/2017   Tobacco use disorder 03/21/2017   Chronic pain syndrome 03/21/2017   Major depressive disorder, recurrent, severe with psychotic features (HCC) 03/12/2017   Suicidal ideation    MDD (major depressive disorder), recurrent episode, severe (HCC) 05/03/2016   GAD (generalized anxiety disorder) 05/13/2015   Myalgia 08/07/2014   Positive ANA (antinuclear antibody) 08/07/2014   Hypothyroidism 08/07/2014

## 2023-10-24 NOTE — ED Notes (Signed)
 Pt is in bed resting quietly with eyes closed. Respirations even and unlabored. No distress noted. Staff will continue to monitor.

## 2023-10-24 NOTE — Group Note (Signed)
 Group Topic: Positive Affirmations  Group Date: 10/24/2023 Start Time: 1200 End Time: 1215 Facilitators: Stanly Stabile, RN  Department: [facility]  Number of Participants: 8  Group Focus: affirmation and coping skills Treatment Modality:  Behavior Modification Therapy Interventions utilized were exploration and support Purpose: explore maladaptive thinking, express feelings, express irrational fears, improve communication skills, increase insight, and regain self-worth  Name: Victoria Galvan Date of Birth: 1970/07/24  MR: [id number]    Level of Participation: did not attend Quality of Participation:  Interactions with others:  Mood/Affect:  Triggers (if applicable):  Cognition:  Progress:  Response:  Plan:   Patients Problems:  Patient Active Problem List   Diagnosis Date Noted   Opioid dependence with opioid-induced mood disorder (HCC) 10/23/2023   Fentanyl use disorder, severe (HCC) 06/13/2023   Mixed connective tissue disease (HCC) 06/13/2023   Substance induced mood disorder (HCC) 06/13/2023   High triglycerides 03/21/2017   Tobacco use disorder 03/21/2017   Chronic pain syndrome 03/21/2017   Major depressive disorder, recurrent, severe with psychotic features (HCC) 03/12/2017   Suicidal ideation    MDD (major depressive disorder), recurrent episode, severe (HCC) 05/03/2016   GAD (generalized anxiety disorder) 05/13/2015   Myalgia 08/07/2014   Positive ANA (antinuclear antibody) 08/07/2014   Hypothyroidism 08/07/2014

## 2023-10-24 NOTE — ED Notes (Signed)
 Patient is experiencing opiate withdrawal symptoms.  Patient cows = 6.  She is irritable

## 2023-10-24 NOTE — ED Notes (Signed)
 Patient is experiencing opiate withdrawal symptoms.  Cows = 6.  She is irritable, hostile and guarded.  Appears disheveled.  Patient given robaxin , imodium , naproxen , bentyl , zofran  and vistaril .  Will monitor.

## 2023-10-24 NOTE — ED Provider Notes (Signed)
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 as needed-See MAR for details -Increase trazodone  to 100 mg nightly for sleep -Continue Seroquel  50 mg nightly x 7 doses for sleep-we will discontinue this medication eventually, I seem not to be effective, and there is no psychosis, and patient denies symptoms of bipolar disorder, and there is no need for a mood stabilizer. -Serotoninergic agent will be added for depressive symptoms once her nausea resolved. -Continue Agitation protocol as per the [facility]  Medications for medical reasons: -Continue levothyroxine  50 mcg every morning at 6 AM for  hypothyroidism-Home    Labs reviewed: -TSH elevated but pt with h/o hypothyroidism and on levothyroxine , and most likely has not been compliant-restarted on admission. -+for Benzos -Elevated cholesterol and Triglycerides and low HDL, educated on healthy food choices as well as exercise, and will need education reinforced when begins to feel better.  Recommendations  -Based on my evaluation the patient appears to have an emergency mental health condition for which the  current admission to the [facility] is appropriate. -Recommending Q15 minute safety checks per unit protocol -Participation in unit group activities -Take medications as ordered -Work with CSW to get into a substance abuse rehab  Donia Snell, NP 10/24/23  3:28 PM

## 2023-10-24 NOTE — Group Note (Signed)
 Group Topic: Communication  Group Date: 10/24/2023 Start Time: 2000 End Time: 2030 Facilitators: Joan Plowman B  Department: [facility]  Number of Participants: 2  Group Focus: abuse issues, acceptance, and check in Treatment Modality:  Individual Therapy Interventions utilized were leisure development Purpose: enhance coping skills, express feelings, increase insight, regain self-worth, reinforce self-care, relapse prevention strategies, and trigger / craving management  Name: Victoria Galvan Date of Birth: February 16, 1971  MR: [id number]    Level of Participation: active Quality of Participation: attentive and cooperative Interactions with others: gave feedback Mood/Affect: appropriate Triggers (if applicable): NA Cognition: coherent/clear Progress: None Response: NA Plan: patient will be encouraged to keep going to groups.   Patients Problems:  Patient Active Problem List   Diagnosis Date Noted   Opioid dependence with opioid-induced mood disorder (HCC) 10/23/2023   Fentanyl use disorder, severe (HCC) 06/13/2023   Mixed connective tissue disease (HCC) 06/13/2023   Substance induced mood disorder (HCC) 06/13/2023   High triglycerides 03/21/2017   Tobacco use disorder 03/21/2017   Chronic pain syndrome 03/21/2017   Major depressive disorder, recurrent, severe with psychotic features (HCC) 03/12/2017   Suicidal ideation    MDD (major depressive disorder), recurrent episode, severe (HCC) 05/03/2016   GAD (generalized anxiety disorder) 05/13/2015   Myalgia 08/07/2014   Positive ANA (antinuclear antibody) 08/07/2014   Hypothyroidism 08/07/2014

## 2023-10-25 MED ORDER — NYSTATIN 100000 UNIT/ML MT SUSP
5.0000 mL | Freq: Four times a day (QID) | OROMUCOSAL | Status: DC
  Administered 2023-10-25 – 2023-10-27 (×6): 500000 [IU] via OROMUCOSAL
  Filled 2023-10-25 (×6): qty 5

## 2023-10-25 MED ORDER — QUETIAPINE FUMARATE 100 MG PO TABS
100.0000 mg | ORAL_TABLET | Freq: Every day | ORAL | Status: DC
  Administered 2023-10-25 – 2023-10-26 (×2): 100 mg via ORAL
  Filled 2023-10-25: qty 7
  Filled 2023-10-25 (×2): qty 1

## 2023-10-25 MED ORDER — GABAPENTIN 100 MG PO CAPS
200.0000 mg | ORAL_CAPSULE | Freq: Three times a day (TID) | ORAL | Status: DC
  Administered 2023-10-25 – 2023-10-27 (×5): 200 mg via ORAL
  Filled 2023-10-25 (×5): qty 2
  Filled 2023-10-25: qty 42

## 2023-10-25 MED ORDER — FLUCONAZOLE 50 MG PO TABS: 100.0000 mg | ORAL_TABLET | Freq: Every day | ORAL | Status: DC

## 2023-10-25 NOTE — Group Note (Signed)
 Group Topic: Social Support  Group Date: 10/25/2023 Start Time: 1645 End Time: 1705 Facilitators: Celinda Suzen NOVAK, NT  Department: [facility]  Number of Participants: 9  Group Focus: recovery and self-esteem Treatment Modality:  Psychoeducation Interventions utilized were support Purpose: increase insight and regain self-worth  Name: Victoria Galvan Date of Birth: 07/04/1970  MR: [id number]    Level of Participation: PT did not attend group Quality of Participation: n/a Interactions with others:  n/a Mood/Affect:  n/a Triggers (if applicable): n/a Cognition: n/a Progress: Other Response: n/a Plan: patient will be encouraged to attend group  Patients Problems:  Patient Active Problem List   Diagnosis Date Noted   Opioid dependence with opioid-induced mood disorder (HCC) 10/23/2023   Fentanyl use disorder, severe (HCC) 06/13/2023   Mixed connective tissue disease (HCC) 06/13/2023   Substance induced mood disorder (HCC) 06/13/2023   High triglycerides 03/21/2017   Tobacco use disorder 03/21/2017   Chronic pain syndrome 03/21/2017   Major depressive disorder, recurrent, severe with psychotic features (HCC) 03/12/2017   Suicidal ideation    MDD (major depressive disorder), recurrent episode, severe (HCC) 05/03/2016   GAD (generalized anxiety disorder) 05/13/2015   Myalgia 08/07/2014   Positive ANA (antinuclear antibody) 08/07/2014   Hypothyroidism 08/07/2014

## 2023-10-25 NOTE — ED Notes (Signed)
 Patient sleeping at the moment. NAD. Will continue to monitor for safety.

## 2023-10-25 NOTE — ED Notes (Signed)
 Pt is in bed calm, awake, quiet, no distress noted. Pt encouraged to make needs known to staff. No withdrawal symptoms noted nor reported at this time. Will continue to monitor.

## 2023-10-25 NOTE — Discharge Instructions (Addendum)
 [facility]   [address] [place], [place] [address] Mailing Address: [address], [place], [place] [address] Hours: Mon-Fri 8AM-5PM Phone: [phone] Fax: [phone] Services  Patient has a hospital follow up appointment for initial evaluation for outpatient services (SAIOP) at [facility], scheduled for Monday July 7 @ 11:00.  Please be sure to bring your photo ID, insurance card and social security card to your appointment and indicate to them you are there for scheduled assessment for SAIOP.   Walk in's are welcome Mon-Friday from 8-5:00pm and are recommended to arrive closest to 8am as possible for a clinical evaluation for recommendations for their psychiatry outpatient therapy services.

## 2023-10-25 NOTE — ED Notes (Signed)
 Patient is in the dayroom with other patients watching TV. Calm and pleasant.NAD Denies needing anything atm. Environment secured per policy. Will monitor for safety.

## 2023-10-25 NOTE — ED Notes (Signed)
 Pt was received on this shift while in the day room, cooperative, affect blunted, said she was feeling a bit anxious. She is in no distress. No other objective withdrawal symptoms noted apart from anxiety. She denied SI/HI/AVH. Staff will monitor.

## 2023-10-25 NOTE — Group Note (Signed)
 Group Topic: Wellness  Group Date: 10/25/2023 Start Time: 1500 End Time: 1530 Facilitators: Sherleen Primmer, RN  Department: [facility]  Number of Participants: 4  Group Focus: goals/reality orientation, relapse prevention, and self-awareness Treatment Modality:  Psychoeducation Interventions utilized were patient education, problem solving, and support Purpose: enhance coping skills and increase insight  Name: JELESA MANGINI Date of Birth: [birth year]  MR: [id number]    Level of Participation: active Quality of Participation: attentive, cooperative, and engaged Interactions with others: gave feedback Mood/Affect: appropriate Triggers (if applicable): none Cognition: coherent/clear and insightful Progress: Gaining insight Response: Pt participated in group and gave a positive feedback. Plan: follow-up needed  Patients Problems:  Patient Active Problem List   Diagnosis Date Noted   Opioid dependence with opioid-induced mood disorder (HCC) 10/23/2023   Fentanyl use disorder, severe (HCC) 06/13/2023   Mixed connective tissue disease (HCC) 06/13/2023   Substance induced mood disorder (HCC) 06/13/2023   High triglycerides 03/21/2017   Tobacco use disorder 03/21/2017   Chronic pain syndrome 03/21/2017   Major depressive disorder, recurrent, severe with psychotic features (HCC) 03/12/2017   Suicidal ideation    MDD (major depressive disorder), recurrent episode, severe (HCC) 05/03/2016   GAD (generalized anxiety disorder) 05/13/2015   Myalgia 08/07/2014   Positive ANA (antinuclear antibody) 08/07/2014   Hypothyroidism 08/07/2014

## 2023-10-25 NOTE — Discharge Planning (Signed)
 LCSW met with patient to assess current mood, affect, physical state, and inquire about needs/goals while here in [facility] and after discharge. Patient reports she presented to [facility] for opoid detox of fentanyl.  Patient is a 53 year old female who is married and lives with spouse. She is unemployed and reports having a long history of opoid use due to medical issues with connective tissue disorder.   Patient reported she has been snorting fentanyl for several years now and has been here at [facility] for detox back in February but has no history of a residential treatment program or followed up with outpatient. Patient stated that she drove herself here to [facility].  Patient stated she feels that she no longer wants to use fentanyl and feels she can remain sober at home with outpatient services. We discussed SAIOP and patient voiced full agreement and stated that she does drive and would be able to attend.  SW will refer and schedule appointment for walk in assessment at [facility] in [place].  Patent stated her depression has been present much of her life and has never taken any medications before but is open to trying something to help with her depression.  Patient currently denies any SI/HI/AVH and reports mood as calm and congruent.  Patient expressed understanding and appreciation of LCSW assistance. No other needs were reported at this time by patient.

## 2023-10-25 NOTE — ED Provider Notes (Signed)
 Behavioral Health Progress Note  Date and Time: 10/25/2023 11:04 AM Name: Victoria Galvan MRN:  [id number]  Subjective:  Victoria Galvan 53 y.o., female patient that presented voluntarily with complaints of depression and requesting substance abuse treatment for Fentanyl use. Victoria Galvan, is seen face to face by this provider, consulted with Dr. Zouev; and chart reviewed on 10/25/23.  PPHx of suicide attempts, opioid use disorder, GAD, MDD and Bipolar disorder. Last psychiatric hospailtalization was in 2018 at [facility] for SI. Medical hx significant for hypothyroidism.   On evaluation Victoria Galvan reports that she did not sleep well and has always struggled with sleep. She cannot remember being on  medication that was effective for sleep and reports getting maybe a couple of hours. She reports that her appetite has improved and that she was able to eat breakfast this morning. She reports overall feeling better, just tired because I cannot sleep and kind of anxious. She reports that depressive symptoms are pretty  much the same as yesterday. She denies SI, HI and AVH. She continues to report symptoms of GAD including restlessness, worry and tension which is exacerbated by substance use and thinking about withdrawals. Per chart, pt has not informed her husband of her relapse which is likely adding to her worry and anxiety, pt not open to discussing at this time. She inquires about discharging and we discussed wanting to complete her clonidine  taper to manage her withdrawals symptoms and help avoid relapsing when discharged. Also talked about finding substance abuse treatment or residential programs in [place], [place] if possible because it is closer to home. She denies current cravings but does report feeling restless and anxious from withdrawing. Most recent COW score:3. She has not required any PRN medications for withdrawals symptoms as of today. Denies any physical complaints at this time.  Pt's urine drug screen  was positive for benzodiazapine however, pt adamantly denies any use of this type of medication. She believes that the Fentanyl may have ben laced with it without her knowing.   During evaluation Victoria Galvan is asleep in bed but easily aroused, in no acute distress.  She is alert & oriented x 4, calm, guarded but cooperative and attentive for this assessment.  Her mood is anxious and dysphoric with congruent affect.  She has normal speech, and behavior.  Objectively there is no evidence of psychosis/mania or delusional thinking. Pt does not appear to be responding to internal or external stimuli.  Patient is able to converse coherently, goal directed thoughts, no distractibility, or pre-occupation.  She currently denies suicidal/self-harm/homicidal ideations, psychosis, and paranoia.  Patient answered assessment questions appropriately.    Diagnosis:  Final diagnoses:  Substance induced mood disorder (HCC)  Opioid use disorder  MDD (major depressive disorder), recurrent severe, without psychosis (HCC)    Total Time spent with patient: 20 minutes  Past Psychiatric History: Suicide attempts, opioid use disorder, GAD, MDD and Bipolar disorder. Last psychiatric hospitalization was in 2018 at [facility] for SI.  Past Medical History: Hypothyroidism and chronic pain Family History: None reported  Family Psychiatric  History: Grandmother-Bipolar disorder and completed suicide. Social History: Pt currently lives with husband and has 2 adult children. Currently unemployed. Reports recent of Fentanyl, denies use of benzodiazepines as resulted in UDS.   Additional Social History:                         Sleep: Fair  Appetite:  Fair  Current Medications:  Current Facility-Administered Medications  Medication Dose Route Frequency Provider Last Rate Last Admin   acetaminophen  (TYLENOL ) tablet 650 mg  650 mg Oral Q6H PRN Olasunkanmi, Oluwatosin, NP       alum & mag hydroxide-simeth  (MAALOX/MYLANTA) 200-200-20 MG/5ML suspension 30 mL  30 mL Oral Q4H PRN Olasunkanmi, Oluwatosin, NP       cloNIDine  (CATAPRES ) tablet 0.1 mg  0.1 mg Oral QID Olasunkanmi, Oluwatosin, NP   0.1 mg at 10/25/23 0932   Followed by   cloNIDine  (CATAPRES ) tablet 0.1 mg  0.1 mg Oral BH-qamhs Olasunkanmi, Oluwatosin, NP       Followed by   [START ON 10/28/2023] cloNIDine  (CATAPRES ) tablet 0.1 mg  0.1 mg Oral QAC breakfast Olasunkanmi, Oluwatosin, NP       dicyclomine  (BENTYL ) tablet 20 mg  20 mg Oral Q6H PRN Olasunkanmi, Oluwatosin, NP   20 mg at 10/24/23 0953   diphenhydrAMINE  (BENADRYL ) capsule 50 mg  50 mg Oral Q6H PRN Olasunkanmi, Oluwatosin, NP   50 mg at 10/25/23 0105   hydrOXYzine  (ATARAX ) tablet 25 mg  25 mg Oral Q6H PRN Olasunkanmi, Oluwatosin, NP   25 mg at 10/25/23 0105   levothyroxine  (SYNTHROID ) tablet 50 mcg  50 mcg Oral QAC breakfast Olasunkanmi, Oluwatosin, NP   50 mcg at 10/25/23 0555   loperamide  (IMODIUM ) capsule 2-4 mg  2-4 mg Oral PRN Olasunkanmi, Oluwatosin, NP   2 mg at 10/24/23 9046   magnesium  hydroxide (MILK OF MAGNESIA) suspension 30 mL  30 mL Oral Daily PRN Olasunkanmi, Oluwatosin, NP       methocarbamol  (ROBAXIN ) tablet 500 mg  500 mg Oral Q8H PRN Olasunkanmi, Oluwatosin, NP   500 mg at 10/24/23 0953   naproxen  (NAPROSYN ) tablet 500 mg  500 mg Oral BID PRN Olasunkanmi, Oluwatosin, NP   500 mg at 10/24/23 9046   naproxen  (NAPROSYN ) tablet 500 mg  500 mg Oral Once Nkwenti, Doris, NP       OLANZapine (ZYPREXA) injection 5 mg  5 mg Intramuscular TID PRN Olasunkanmi, Oluwatosin, NP       OLANZapine zydis (ZYPREXA) disintegrating tablet 5 mg  5 mg Oral TID PRN Olasunkanmi, Oluwatosin, NP       ondansetron  (ZOFRAN -ODT) disintegrating tablet 4 mg  4 mg Oral Q6H PRN Olasunkanmi, Oluwatosin, NP   4 mg at 10/24/23 0953   QUEtiapine  (SEROQUEL ) tablet 50 mg  50 mg Oral QHS Olasunkanmi, Oluwatosin, NP   50 mg at 10/24/23 2124   traZODone  (DESYREL ) tablet 100 mg  100 mg Oral QHS Zouev, Dmitri,  MD   100 mg at 10/24/23 2124   Current Outpatient Medications  Medication Sig Dispense Refill   gabapentin  (NEURONTIN ) 300 MG capsule Take 1 capsule (300 mg total) by mouth 2 (two) times daily. (Patient taking differently: Take 300 mg by mouth 2 (two) times daily as needed (For anxiety).) 60 capsule 0   levothyroxine  (SYNTHROID ) 50 MCG tablet Take 50 mcg by mouth every morning.     loperamide  (IMODIUM ) 2 MG capsule Take 2 mg by mouth 2 (two) times daily as needed for diarrhea or loose stools.     QUEtiapine  (SEROQUEL ) 50 MG tablet Take 1 tablet (50 mg total) by mouth every 6 (six) hours as needed (for anxiety). (Patient taking differently: Take 50 mg by mouth at bedtime.) 30 tablet 0   Facility-Administered Medications Ordered in Other Encounters  Medication Dose Route Frequency Provider Last Rate Last Admin   gabapentin  (NEURONTIN ) tablet 300 mg  300 mg Oral TID Pucilowska,  Catherene NOVAK, MD        Labs  Lab Results:  Admission on 10/23/2023, Discharged on 10/23/2023  Component Date Value Ref Range Status   WBC 10/23/2023 8.7  4.0 - 10.5 K/uL Final   RBC 10/23/2023 4.67  3.87 - 5.11 MIL/uL Final   Hemoglobin 10/23/2023 14.1  12.0 - 15.0 g/dL Final   HCT 92/98/7974 40.8  36.0 - 46.0 % Final   MCV 10/23/2023 87.4  80.0 - 100.0 fL Final   MCH 10/23/2023 30.2  26.0 - 34.0 pg Final   MCHC 10/23/2023 34.6  30.0 - 36.0 g/dL Final   RDW 92/98/7974 12.1  11.5 - 15.5 % Final   Platelets 10/23/2023 279  150 - 400 K/uL Final   nRBC 10/23/2023 0.0  0.0 - 0.2 % Final   Neutrophils Relative % 10/23/2023 73  % Final   Neutro Abs 10/23/2023 6.3  1.7 - 7.7 K/uL Final   Lymphocytes Relative 10/23/2023 18  % Final   Lymphs Abs 10/23/2023 1.6  0.7 - 4.0 K/uL Final   Monocytes Relative 10/23/2023 7  % Final   Monocytes Absolute 10/23/2023 0.6  0.1 - 1.0 K/uL Final   Eosinophils Relative 10/23/2023 1  % Final   Eosinophils Absolute 10/23/2023 0.1  0.0 - 0.5 K/uL Final   Basophils Relative 10/23/2023 1  %  Final   Basophils Absolute 10/23/2023 0.1  0.0 - 0.1 K/uL Final   Immature Granulocytes 10/23/2023 0  % Final   Abs Immature Granulocytes 10/23/2023 0.02  0.00 - 0.07 K/uL Final   Performed at [facility], [address], [place], [place] [address]   Sodium 10/23/2023 138  135 - 145 mmol/L Final   Potassium 10/23/2023 4.0  3.5 - 5.1 mmol/L Final   Chloride 10/23/2023 101  98 - 111 mmol/L Final   CO2 10/23/2023 25  22 - 32 mmol/L Final   Glucose, Bld 10/23/2023 106 (H)  70 - 99 mg/dL Final   Glucose reference range applies only to samples taken after fasting for at least 8 hours.   BUN 10/23/2023 10  6 - 20 mg/dL Final   Creatinine, Ser 10/23/2023 0.88  0.44 - 1.00 mg/dL Final   Calcium 92/98/7974 10.0  8.9 - 10.3 mg/dL Final   Total Protein 92/98/7974 6.8  6.5 - 8.1 g/dL Final   Albumin 92/98/7974 4.3  3.5 - 5.0 g/dL Final   AST 92/98/7974 15  15 - 41 U/L Final   ALT 10/23/2023 14  0 - 44 U/L Final   Alkaline Phosphatase 10/23/2023 128 (H)  38 - 126 U/L Final   Total Bilirubin 10/23/2023 0.7  0.0 - 1.2 mg/dL Final   GFR, Estimated 10/23/2023 >60  >60 mL/min Final   Comment: (NOTE) Calculated using the CKD-EPI Creatinine Equation (2021)    Anion gap 10/23/2023 12  5 - 15 Final   Performed at [facility], [address] [place], [place] [address]   Hgb A1c MFr Bld 10/23/2023 4.9  4.8 - 5.6 % Final   Comment: (NOTE) Diagnosis of Diabetes The following HbA1c ranges recommended by the American Diabetes Association (ADA) may be used as an aid in the diagnosis of diabetes mellitus.  Hemoglobin             Suggested A1C NGSP%              Diagnosis  <5.7  Non Diabetic  5.7-6.4                Pre-Diabetic  >6.4                   Diabetic  <7.0                   Glycemic control for                       adults with diabetes.     Mean Plasma Glucose 10/23/2023 93.93  mg/dL Final   Performed at [facility], [address], [place],  [place] [address]   Alcohol, Ethyl (B) 10/23/2023 <15  <15 mg/dL Final   Comment: (NOTE) For medical purposes only. Performed at [facility], [address] [place], [place] [address]    Cholesterol 10/23/2023 263 (H)  0 - 200 mg/dL Final   Triglycerides 92/98/7974 314 (H)  <150 mg/dL Final   HDL 92/98/7974 35 (L)  >40 mg/dL Final   Total CHOL/HDL Ratio 10/23/2023 7.5  RATIO Final   VLDL 10/23/2023 63 (H)  0 - 40 mg/dL Final   LDL Cholesterol 10/23/2023 165 (H)  0 - 99 mg/dL Final   Comment:        Total Cholesterol/HDL:CHD Risk Coronary Heart Disease Risk Table                     Men   Women  1/2 Average Risk   3.4   3.3  Average Risk       5.0   4.4  2 X Average Risk   9.6   7.1  3 X Average Risk  23.4   11.0        Use the calculated Patient Ratio above and the CHD Risk Table to determine the patient's CHD Risk.        ATP III CLASSIFICATION (LDL):  <100     mg/dL   Optimal  899-870  mg/dL   Near or Above                    Optimal  130-159  mg/dL   Borderline  839-810  mg/dL   High  >809     mg/dL   Very High Performed at [facility], [address], [place], [place] [address]    TSH 10/23/2023 7.886 (H)  0.350 - 4.500 uIU/mL Final   Comment: Performed by a 3rd Generation assay with a functional sensitivity of <=0.01 uIU/mL. Performed at [facility], [address] [place], [place] [address]    Prolactin 10/23/2023 3.0 (L)  3.6 - 25.2 ng/mL Final   Comment: (NOTE) Performed At: [facility] [place] [address] [place], [place] [id number] Jennette Shorter MD Ey:[id number]    POC Amphetamine UR 10/23/2023 None Detected  NONE DETECTED (Cut Off Level 1000 ng/mL) Final   POC Secobarbital (BAR) 10/23/2023 None Detected  NONE DETECTED (Cut Off Level 300 ng/mL) Final   POC Buprenorphine (BUP) 10/23/2023 None Detected  NONE DETECTED (Cut Off Level 10 ng/mL) Final   POC Oxazepam (BZO) 10/23/2023 Positive (A)  NONE DETECTED (Cut Off Level 300 ng/mL) Final    POC Cocaine UR 10/23/2023 None Detected  NONE DETECTED (Cut Off Level 300 ng/mL) Final   POC Methamphetamine UR 10/23/2023 None Detected  NONE DETECTED (Cut Off Level 1000 ng/mL) Final   POC Morphine 10/23/2023 None Detected  NONE DETECTED (Cut Off Level 300 ng/mL) Final   POC Methadone UR 10/23/2023 None Detected  NONE DETECTED (Cut Off Level 300 ng/mL) Final   POC Oxycodone  UR 10/23/2023 None Detected  NONE DETECTED (Cut Off Level 100 ng/mL) Final   POC Marijuana UR 10/23/2023 None Detected  NONE DETECTED (Cut Off Level 50 ng/mL) Final   Preg Test, Ur 10/23/2023 Negative  Negative Final  Admission on 06/13/2023, Discharged on 06/17/2023  Component Date Value Ref Range Status   Sodium 06/17/2023 139  135 - 145 mmol/L Final   Potassium 06/17/2023 3.5  3.5 - 5.1 mmol/L Final   Chloride 06/17/2023 104  98 - 111 mmol/L Final   CO2 06/17/2023 23  22 - 32 mmol/L Final   Glucose, Bld 06/17/2023 84  70 - 99 mg/dL Final   Glucose reference range applies only to samples taken after fasting for at least 8 hours.   BUN 06/17/2023 16  6 - 20 mg/dL Final   Creatinine, Ser 06/17/2023 0.88  0.44 - 1.00 mg/dL Final   Calcium 97/76/7974 9.5  8.9 - 10.3 mg/dL Final   GFR, Estimated 06/17/2023 >60  >60 mL/min Final   Comment: (NOTE) Calculated using the CKD-EPI Creatinine Equation (2021)    Anion gap 06/17/2023 12  5 - 15 Final   Performed at [facility], [address] [place] [address], [place], [place] [address]  Admission on 06/12/2023, Discharged on 06/13/2023  Component Date Value Ref Range Status   Sodium 06/12/2023 136  135 - 145 mmol/L Final   Potassium 06/12/2023 3.1 (L)  3.5 - 5.1 mmol/L Final   Chloride 06/12/2023 101  98 - 111 mmol/L Final   CO2 06/12/2023 25  22 - 32 mmol/L Final   Glucose, Bld 06/12/2023 99  70 - 99 mg/dL Final   Glucose reference range applies only to samples taken after fasting for at least 8 hours.   BUN 06/12/2023 8  6 - 20 mg/dL Final   Creatinine, Ser 06/12/2023 0.80  0.44 -  1.00 mg/dL Final   Calcium 97/81/7974 9.1  8.9 - 10.3 mg/dL Final   Total Protein 97/81/7974 7.4  6.5 - 8.1 g/dL Final   Albumin 97/81/7974 4.3  3.5 - 5.0 g/dL Final   AST 97/81/7974 25  15 - 41 U/L Final   ALT 06/12/2023 20  0 - 44 U/L Final   Alkaline Phosphatase 06/12/2023 118  38 - 126 U/L Final   Total Bilirubin 06/12/2023 0.6  0.0 - 1.2 mg/dL Final   GFR, Estimated 06/12/2023 >60  >60 mL/min Final   Comment: (NOTE) Calculated using the CKD-EPI Creatinine Equation (2021)    Anion gap 06/12/2023 10  5 - 15 Final   Performed at [facility], [address] [place], [place] [address]   Alcohol, Ethyl (B) 06/12/2023 <10  <10 mg/dL Final   Comment: (NOTE) Lowest detectable limit for serum alcohol is 10 mg/dL.  For medical purposes only. Performed at [facility], [address] [place], [place] [address]    Opiates 06/12/2023 NONE DETECTED  NONE DETECTED Final   Cocaine 06/12/2023 NONE DETECTED  NONE DETECTED Final   Benzodiazepines 06/12/2023 POSITIVE (A)  NONE DETECTED Final   Amphetamines 06/12/2023 NONE DETECTED  NONE DETECTED Final   Tetrahydrocannabinol 06/12/2023 NONE DETECTED  NONE DETECTED Final   Barbiturates 06/12/2023 NONE DETECTED  NONE DETECTED Final   Comment: (NOTE) DRUG SCREEN FOR MEDICAL PURPOSES ONLY.  IF CONFIRMATION IS NEEDED FOR ANY PURPOSE, NOTIFY LAB WITHIN 5 DAYS.  LOWEST DETECTABLE LIMITS FOR URINE DRUG SCREEN Drug Class                     Cutoff (ng/mL) Amphetamine and metabolites    1000 Barbiturate and metabolites    200 Benzodiazepine                 200 Opiates and metabolites        300 Cocaine and metabolites        300 THC                            50 Performed at [facility], [address], [place], [place] [address]    WBC 06/12/2023 12.4 (H)  4.0 - 10.5 K/uL Final   RBC 06/12/2023 4.62  3.87 - 5.11 MIL/uL Final   Hemoglobin 06/12/2023 14.8  12.0 - 15.0 g/dL Final   HCT 97/81/7974 42.0  36.0 - 46.0 % Final   MCV 06/12/2023 90.9   80.0 - 100.0 fL Final   MCH 06/12/2023 32.0  26.0 - 34.0 pg Final   MCHC 06/12/2023 35.2  30.0 - 36.0 g/dL Final   RDW 97/81/7974 12.8  11.5 - 15.5 % Final   Platelets 06/12/2023 295  150 - 400 K/uL Final   nRBC 06/12/2023 0.0  0.0 - 0.2 % Final   Neutrophils Relative % 06/12/2023 76  % Final   Neutro Abs 06/12/2023 9.4 (H)  1.7 - 7.7 K/uL Final   Lymphocytes Relative 06/12/2023 13  % Final   Lymphs Abs 06/12/2023 1.7  0.7 - 4.0 K/uL Final   Monocytes Relative 06/12/2023 7  % Final   Monocytes Absolute 06/12/2023 0.8  0.1 - 1.0 K/uL Final   Eosinophils Relative 06/12/2023 3  % Final   Eosinophils Absolute 06/12/2023 0.4  0.0 - 0.5 K/uL Final   Basophils Relative 06/12/2023 1  % Final   Basophils Absolute 06/12/2023 0.1  0.0 - 0.1 K/uL Final   Immature Granulocytes 06/12/2023 0  % Final   Abs Immature Granulocytes 06/12/2023 0.03  0.00 - 0.07 K/uL Final   Performed at [facility], [address], [place], [place] [address]   Magnesium  06/12/2023 2.1  1.7 - 2.4 mg/dL Final   Performed at [facility], [address], [place], [place] [address]    Blood Alcohol level:  Lab Results  Component Value Date   [facility] <15 10/23/2023   ETH <10 06/12/2023    Metabolic Disorder Labs: Lab Results  Component Value Date   HGBA1C 4.9 10/23/2023   MPG 93.93 10/23/2023   MPG 111.15 03/20/2017   Lab Results  Component Value Date   PROLACTIN 3.0 (L) 10/23/2023   Lab Results  Component Value Date   CHOL 263 (H) 10/23/2023   TRIG 314 (H) 10/23/2023   HDL 35 (L) 10/23/2023   CHOLHDL 7.5 10/23/2023   VLDL 63 (H) 10/23/2023   LDLCALC 165 (H) 10/23/2023   LDLCALC UNABLE TO CALCULATE IF TRIGLYCERIDE OVER 400 mg/dL 88/72/7981    Therapeutic Lab Levels: No results found for: LITHIUM No results found for: VALPROATE No results found for: CBMZ  Physical Findings   AIMS    Flowsheet Row [facility]  AIMS Total  Score 0   AUDIT    Flowsheet Row Admission (Discharged) from 03/12/2017 in [facility] Ad[facility] ([facility]  Alcohol Use Disorder Identification Test Final Score (AUDIT) 1 2   PHQ2-9    Flowsheet Row ED from 10/23/2023 in [facility] ED from 06/13/2023 in [facility]  PHQ-2 Total Score 4 0  PHQ-9 Total Score 11 0   Flowsheet Row ED from 10/23/2023 in [facility] Most recent reading at 10/23/2023  5:08 PM ED from 10/23/2023 in [facility] Most recent reading at 10/23/2023  1:48 PM ED from 06/13/2023 in [facility] Most recent reading at 06/14/2023 11:39 AM  C-SSRS RISK CATEGORY Low Risk High Risk High Risk     Musculoskeletal  Strength & Muscle Tone: within normal limits Gait & Station: normal Patient leans: N/A  Psychiatric Specialty Exam  Presentation  General Appearance:  Fairly Groomed; Appropriate for Environment  Eye Contact: Fair  Speech: Clear and Coherent  Speech Volume: Decreased  Handedness: Right   Mood and Affect  Mood: Anxious; Dysphoric  Affect: Congruent   Thought Process  Thought Processes: Coherent  Descriptions of Associations:Intact  Orientation:Full (Time, Place and Person)  Thought Content:WDL; Logical  Diagnosis of Schizophrenia or Schizoaffective disorder in past: No    Hallucinations:Hallucinations: None  Ideas of Reference:None  Suicidal Thoughts:Suicidal Thoughts: No SI Active Intent and/or Plan: Without Intent; Without Plan  Homicidal Thoughts:Homicidal Thoughts: No   Sensorium  Memory: Recent Fair; Immediate Good  Judgment: Fair  Insight: Fair   Chartered certified accountant: Fair  Attention Span: Fair  Recall: [employer] of  Knowledge: Fair  Language: Fair   Psychomotor Activity  Psychomotor Activity: Psychomotor Activity: Normal   Assets  Assets: Manufacturing systems engineer; Desire for Improvement; Housing; Physical Health; Resilience   Sleep  Sleep: Sleep: Fair  Estimated Sleeping Duration (Last 24 Hours): 3.25-4.00 hours  No data recorded  Physical Exam  Physical Exam Vitals and nursing note reviewed.  Constitutional:      Appearance: Normal appearance.  HENT:     Head: Normocephalic.     Nose: Nose normal.  Eyes:     Extraocular Movements: Extraocular movements intact.  Cardiovascular:     Rate and Rhythm: Normal rate.  Pulmonary:     Effort: Pulmonary effort is normal.  Musculoskeletal:        General: Normal range of motion.     Cervical back: Normal range of motion.  Neurological:     General: No focal deficit present.     Mental Status: She is alert and oriented to person, place, and time.    Review of Systems  Constitutional: Negative.   HENT: Negative.    Eyes: Negative.   Respiratory: Negative.    Cardiovascular: Negative.   Gastrointestinal: Negative.   Genitourinary: Negative.   Musculoskeletal:  Positive for back pain.  Neurological: Negative.   Endo/Heme/Allergies: Negative.   Psychiatric/Behavioral:  Positive for depression and substance abuse. The patient is nervous/anxious.    Blood pressure 114/82, pulse 63, temperature 97.8 F (36.6 C), temperature source Oral, resp. rate 16, SpO2 99%. There is no height or weight on file to calculate BMI.  Treatment Plan Summary: Daily contact with patient to assess and evaluate symptoms and progress in treatment  Pt denies current suicidal/ homicidal ideations and psychotic symptoms. Withdrawal symptoms have improved however, she continues to report feeling fatigued d/t lack of sleep, restlessness and anxiety. Most recent COWs score was 3 for restlessness and anxiety. Educated pt on PRN medications that are available to  her for  withdrawal symptom management. Reports no change in depressive symptoms and is hopeful her mood will start to improve as she feels better from detox. She is tolerating the Seroquel  50 mg  and Trazodone  100 mg  Will increased the Seroquel  to 100 mg to help with sleep and mood. She is requesting substance abuse treatment close to [place], [place] upon discharge.   Medications: - Increase Seroquel  to 100 mg nightly for anxiety, mood and sleep. (Per chart review 05/2021, patient was previously taking Seroquel  50 mg daily, Seroquel  100 mg nightly and also had Seroquel  50 mg as needed for anxiety.) - Continue trazodone  100 mg nightly for sleep - Continue agitation protocol per policy - Continue levothyroxine  50 mcg daily for hypothyroidism  Fentanyl use: -Continue COWS protocol to monitor withdrawal symptoms - Continue clonidine  taper for withdrawal symptoms - Continue Bentyl  20 mg every 6 hours as needed for abdominal cramping - Continue loperamide  2 to 4 mg as needed for diarrhea - Continue methocarbamol  500 mg every 8 hours as needed for muscle spasms - Continue naproxen  500 mg twice daily as needed for pain - Continue ondansetron  4 mg every 6 hours as needed for nausea vomiting    Alan JAYSON Mcardle, NP 10/25/2023 11:04 AM

## 2023-10-25 NOTE — ED Notes (Signed)
 Pt came up to [facility] upset that she couldn't have anything else to help her sleep.

## 2023-10-25 NOTE — Group Note (Signed)
 Group Topic: Social Support  Group Date: 10/25/2023 Start Time: 2000 End Time: 2030 Facilitators: Floy Morene RAMAN, NT  Department: [facility]  Number of Participants: 8  Group Focus: check in Treatment Modality:  Individual Therapy Interventions utilized were leisure development and support Purpose: express feelings  Name: Victoria Galvan Date of Birth: 12-Feb-1971  MR: [id number]    Level of Participation: active Quality of Participation: attentive Interactions with others: gave feedback Mood/Affect: bored Triggers (if applicable): N.A.  Cognition: coherent/clear Progress: Minimal Response: N.A. Plan: follow-up needed  Patients Problems:  Patient Active Problem List   Diagnosis Date Noted   Opioid dependence with opioid-induced mood disorder (HCC) 10/23/2023   Fentanyl use disorder, severe (HCC) 06/13/2023   Mixed connective tissue disease (HCC) 06/13/2023   Substance induced mood disorder (HCC) 06/13/2023   High triglycerides 03/21/2017   Tobacco use disorder 03/21/2017   Chronic pain syndrome 03/21/2017   Major depressive disorder, recurrent, severe with psychotic features (HCC) 03/12/2017   Suicidal ideation    MDD (major depressive disorder), recurrent episode, severe (HCC) 05/03/2016   GAD (generalized anxiety disorder) 05/13/2015   Myalgia 08/07/2014   Positive ANA (antinuclear antibody) 08/07/2014   Hypothyroidism 08/07/2014

## 2023-10-25 NOTE — ED Notes (Signed)
 Patient is awake and alert on unit this morning.  She is out of bed and eating breakfast.  Vital signs taken.  No distress and withdrawal symptoms improved.  Will monitor.

## 2023-10-25 NOTE — ED Notes (Signed)
 Pt made a comment about how it must be nice to have clothes so this MHT went and got her some clothes out the donation bin. She's got two outfits now.

## 2023-10-26 LAB — COMPREHENSIVE METABOLIC PANEL WITH GFR
ALT: 11 U/L (ref 0–44)
AST: 12 U/L — ABNORMAL LOW (ref 15–41)
Albumin: 3.9 g/dL (ref 3.5–5.0)
Alkaline Phosphatase: 103 U/L (ref 38–126)
Anion gap: 10 (ref 5–15)
BUN: 18 mg/dL (ref 6–20)
CO2: 23 mmol/L (ref 22–32)
Calcium: 9.5 mg/dL (ref 8.9–10.3)
Chloride: 106 mmol/L (ref 98–111)
Creatinine, Ser: 0.72 mg/dL (ref 0.44–1.00)
GFR, Estimated: >60 mL/min (ref >60)
Glucose, Bld: 83 mg/dL (ref 70–99)
Potassium: 3.6 mmol/L (ref 3.5–5.1)
Sodium: 139 mmol/L (ref 135–145)
Total Bilirubin: 0.8 mg/dL (ref 0.0–1.2)
Total Protein: 6.6 g/dL (ref 6.5–8.1)

## 2023-10-26 LAB — CBC WITH DIFFERENTIAL/PLATELET
Abs Immature Granulocytes: 0.03 K/uL (ref 0.00–0.07)
Basophils Absolute: 0.1 K/uL (ref 0.0–0.1)
Basophils Relative: 1 %
Eosinophils Absolute: 0.1 K/uL (ref 0.0–0.5)
Eosinophils Relative: 1 %
HCT: 40.2 % (ref 36.0–46.0)
Hemoglobin: 14.2 g/dL (ref 12.0–15.0)
Immature Granulocytes: 0 %
Lymphocytes Relative: 36 %
Lymphs Abs: 3.2 K/uL (ref 0.7–4.0)
MCH: 30.9 pg (ref 26.0–34.0)
MCHC: 35.3 g/dL (ref 30.0–36.0)
MCV: 87.6 fL (ref 80.0–100.0)
Monocytes Absolute: 1 K/uL (ref 0.1–1.0)
Monocytes Relative: 11 %
Neutro Abs: 4.7 K/uL (ref 1.7–7.7)
Neutrophils Relative %: 51 %
Platelets: 286 K/uL (ref 150–400)
RBC: 4.59 MIL/uL (ref 3.87–5.11)
RDW: 12.5 % (ref 11.5–15.5)
WBC: 9.1 K/uL (ref 4.0–10.5)
nRBC: 0 % (ref 0.0–0.2)

## 2023-10-26 LAB — T4, FREE: Free T4: 0.81 ng/dL (ref 0.61–1.12)

## 2023-10-26 NOTE — ED Notes (Signed)
 Pt sitting in dayroom participating in group session. No acute distress noted. No concerns voiced. Informed pt to notify staff with any needs or assistance. Pt verbalized understanding and agreement. Will continue to monitor for safety.

## 2023-10-26 NOTE — ED Notes (Signed)
 Pt sleeping in no acute distress. RR even and unlabored. Environment secured. Will continue to monitor for safety.

## 2023-10-26 NOTE — ED Notes (Signed)
 Pt approached nurses station requesting to discharge today. Pt states, I want out of here today. Provider made aware. Pt states, I was told I could leave when I want to and I don't want residential. Provider made aware. Provider in room with pt at present. Will monitor for safety.

## 2023-10-26 NOTE — ED Notes (Signed)
 Patient A&Ox4. Denies intent to harm self/others when asked. Denies A/VH. Patient denies any physical complaints when asked. Pt states, I feel better today than I did yesterday.  Support and encouragement provided. Routine safety checks conducted according to facility protocol. Encouraged patient to notify staff if thoughts of harm toward self or others arise. Patient verbalize understanding and agreement. Will continue to monitor for safety.

## 2023-10-26 NOTE — Group Note (Signed)
 Group Topic: Relaxation  Group Date: 10/26/2023 Start Time: 1040 End Time: 1110 Facilitators: Alyse Leilani LABOR, NT  Department: [facility]  Number of Participants: 9  Group Focus: activities of daily living skills and relaxation Treatment Modality:  Individual Therapy Interventions utilized were group exercise and story telling Purpose: enhance coping skills  Name: Victoria Galvan Date of Birth: Sep 01, 1970  MR: [id number]    Level of Participation: minimal Quality of Participation: cooperative Interactions with others: gave feedback Mood/Affect: brightens with interaction Triggers (if applicable): none Cognition: insightful Progress: Gaining insight Response: none Plan: patient will be encouraged to attend group's more  Patients Problems:  Patient Active Problem List   Diagnosis Date Noted   Opioid dependence with opioid-induced mood disorder (HCC) 10/23/2023   Fentanyl use disorder, severe (HCC) 06/13/2023   Mixed connective tissue disease (HCC) 06/13/2023   Substance induced mood disorder (HCC) 06/13/2023   High triglycerides 03/21/2017   Tobacco use disorder 03/21/2017   Chronic pain syndrome 03/21/2017   Major depressive disorder, recurrent, severe with psychotic features (HCC) 03/12/2017   Suicidal ideation    MDD (major depressive disorder), recurrent episode, severe (HCC) 05/03/2016   GAD (generalized anxiety disorder) 05/13/2015   Myalgia 08/07/2014   Positive ANA (antinuclear antibody) 08/07/2014   Hypothyroidism 08/07/2014

## 2023-10-26 NOTE — Discharge Planning (Signed)
 Per provider, patient is agreeable to DC on Saturday. She is scheduled for her follow up with [facility] on Monday 7/7 for SAIOP assessment. She has transportation to home and drove self here. No other concerns noted or reported.

## 2023-10-26 NOTE — ED Provider Notes (Signed)
 Behavioral Health Progress Note  Date and Time: 10/26/2023 11:46 AM Name: Victoria Galvan MRN:  [id number]  Subjective:  Victoria Galvan 53 y.o., female patient that presented voluntarily with complaints of depression and requesting substance abuse treatment for Fentanyl use. Victoria Galvan, is seen face to face by this provider, consulted with Dr. Zouev; and chart reviewed on 10/26/23.  PPHx of suicide attempts, opioid use disorder, GAD, MDD and Bipolar disorder. Last psychiatric hospailtalization was in 2018 at [facility] for SI. Medical hx significant for hypothyroidism and chronic pain.    On evaluation Victoria Galvan reports that she did get better sleep last night and slept about 4-5 hours which was much better than its been in a long time.  She reports that her appetite continues to improve. She reports having some mild diarrhea once today but feels that it was related to taking medication on empty stomach. She reports overall I feel a lot better and don't think I am really withdrawing anymore. Im just happy I was able to get some sleep last night. She denies SI, HI and AVH. She clarifies that when she is withdrawing, the symptoms are so bad at times, that she would rather die then go through it. Pt does request to discharge today, however we discussed that it is not recommended due to likelihood of her relapsing (similar to her previous admission). We also discussed the need to assess risk factors and monitor for safety since she did endorse some passive SI upon triage this encounter. After educating, pt was willing to continue treatment at [facility] with plans to discharge tomorrow, if stable. Pt reports that her son's birthday is tmrw and he will be turing 30, so she is motivated and looking forward to seeing family. Pt currently lives at home with her husband and is unemployed. She does endorse having ongoing cravings for Fentanyl. Discussed initiating Suboxone but pt declined due to not having insurance to cover the  costs of the medication.  Most recent COW score: 0. Denies any physical complaints at this time.Pt states that she plans to participate in the SA-IOP at [facility] and hopes that this will help her to not relapse. She also reports that she has deleted her drug dealer's phone number and cannot contact him.   During evaluation Victoria Galvan is awake in room, in no acute distress.  She is alert & oriented x 4, calm, cooperative and attentive for this assessment.  Her mood is somewhat anxious with a much brighter affect than yesterday. She has normal speech, and behavior.  Objectively there is no evidence of psychosis/mania or delusional thinking. Pt does not appear to be responding to internal or external stimuli.  Patient is able to converse coherently, goal directed thoughts, no distractibility, or pre-occupation.  She currently denies suicidal/self-harm/homicidal ideations, psychosis, and paranoia.  Patient answered assessment questions appropriately.    Diagnosis:  Final diagnoses:  Substance induced mood disorder (HCC)  Opioid use disorder  MDD (major depressive disorder), recurrent severe, without psychosis (HCC)    Total Time spent with patient: 20 minutes  Past Psychiatric History: Suicide attempts, opioid use disorder, GAD, MDD and Bipolar disorder. Last psychiatric hospitalization was in 2018 at [facility] for SI.  Past Medical History: Hypothyroidism and chronic pain Family History: None reported  Family Psychiatric  History: Grandmother-Bipolar disorder and completed suicide. Social History: Pt currently lives with husband and has 2 adult children. Currently unemployed. Reports recent of Fentanyl, denies use of benzodiazepines as resulted in  UDS.   Additional Social History:                         Sleep: Fair  Appetite:  Fair  Current Medications:  Current Facility-Administered Medications  Medication Dose Route Frequency Provider Last Rate Last Admin   acetaminophen  (TYLENOL )  tablet 650 mg  650 mg Oral Q6H PRN Olasunkanmi, Oluwatosin, NP       alum & mag hydroxide-simeth (MAALOX/MYLANTA) 200-200-20 MG/5ML suspension 30 mL  30 mL Oral Q4H PRN Olasunkanmi, Oluwatosin, NP       cloNIDine  (CATAPRES ) tablet 0.1 mg  0.1 mg Oral BH-qamhs Olasunkanmi, Oluwatosin, NP   0.1 mg at 10/26/23 0800   Followed by   NOREEN ON 10/28/2023] cloNIDine  (CATAPRES ) tablet 0.1 mg  0.1 mg Oral QAC breakfast Olasunkanmi, Oluwatosin, NP       dicyclomine  (BENTYL ) tablet 20 mg  20 mg Oral Q6H PRN Olasunkanmi, Oluwatosin, NP   20 mg at 10/25/23 8161   diphenhydrAMINE  (BENADRYL ) capsule 50 mg  50 mg Oral Q6H PRN Olasunkanmi, Oluwatosin, NP   50 mg at 10/25/23 0105   gabapentin  (NEURONTIN ) capsule 200 mg  200 mg Oral TID Zouev, Dmitri, MD   200 mg at 10/26/23 1003   hydrOXYzine  (ATARAX ) tablet 25 mg  25 mg Oral Q6H PRN Olasunkanmi, Oluwatosin, NP   25 mg at 10/25/23 1839   levothyroxine  (SYNTHROID ) tablet 50 mcg  50 mcg Oral QAC breakfast Olasunkanmi, Oluwatosin, NP   50 mcg at 10/26/23 9344   loperamide  (IMODIUM ) capsule 2-4 mg  2-4 mg Oral PRN Olasunkanmi, Oluwatosin, NP   4 mg at 10/26/23 1117   magnesium  hydroxide (MILK OF MAGNESIA) suspension 30 mL  30 mL Oral Daily PRN Olasunkanmi, Oluwatosin, NP       methocarbamol  (ROBAXIN ) tablet 500 mg  500 mg Oral Q8H PRN Olasunkanmi, Oluwatosin, NP   500 mg at 10/25/23 1839   naproxen  (NAPROSYN ) tablet 500 mg  500 mg Oral BID PRN Olasunkanmi, Oluwatosin, NP   500 mg at 10/24/23 9046   nystatin  (MYCOSTATIN ) 100000 UNIT/ML suspension 500,000 Units  5 mL Mouth/Throat QID Zouev, Dmitri, MD   500,000 Units at 10/26/23 1003   OLANZapine  (ZYPREXA ) injection 5 mg  5 mg Intramuscular TID PRN Olasunkanmi, Oluwatosin, NP       OLANZapine  zydis (ZYPREXA ) disintegrating tablet 5 mg  5 mg Oral TID PRN Olasunkanmi, Oluwatosin, NP       ondansetron  (ZOFRAN -ODT) disintegrating tablet 4 mg  4 mg Oral Q6H PRN Olasunkanmi, Oluwatosin, NP   4 mg at 10/25/23 1356   QUEtiapine   (SEROQUEL ) tablet 100 mg  100 mg Oral QHS Burnette Sautter C, NP   100 mg at 10/25/23 2118   traZODone  (DESYREL ) tablet 100 mg  100 mg Oral QHS Zouev, Dmitri, MD   100 mg at 10/25/23 2118   Current Outpatient Medications  Medication Sig Dispense Refill   gabapentin  (NEURONTIN ) 300 MG capsule Take 1 capsule (300 mg total) by mouth 2 (two) times daily. (Patient taking differently: Take 300 mg by mouth 2 (two) times daily as needed (For anxiety).) 60 capsule 0   levothyroxine  (SYNTHROID ) 50 MCG tablet Take 50 mcg by mouth every morning.     loperamide  (IMODIUM ) 2 MG capsule Take 2 mg by mouth 2 (two) times daily as needed for diarrhea or loose stools.     QUEtiapine  (SEROQUEL ) 50 MG tablet Take 1 tablet (50 mg total) by mouth every 6 (six) hours as needed (for  anxiety). (Patient taking differently: Take 50 mg by mouth at bedtime.) 30 tablet 0    Labs  Lab Results:  Admission on 10/23/2023  Component Date Value Ref Range Status   WBC 10/26/2023 9.1  4.0 - 10.5 K/uL Final   RBC 10/26/2023 4.59  3.87 - 5.11 MIL/uL Final   Hemoglobin 10/26/2023 14.2  12.0 - 15.0 g/dL Final   HCT 92/95/7974 40.2  36.0 - 46.0 % Final   MCV 10/26/2023 87.6  80.0 - 100.0 fL Final   MCH 10/26/2023 30.9  26.0 - 34.0 pg Final   MCHC 10/26/2023 35.3  30.0 - 36.0 g/dL Final   RDW 92/95/7974 12.5  11.5 - 15.5 % Final   Platelets 10/26/2023 286  150 - 400 K/uL Final   nRBC 10/26/2023 0.0  0.0 - 0.2 % Final   Neutrophils Relative % 10/26/2023 51  % Final   Neutro Abs 10/26/2023 4.7  1.7 - 7.7 K/uL Final   Lymphocytes Relative 10/26/2023 36  % Final   Lymphs Abs 10/26/2023 3.2  0.7 - 4.0 K/uL Final   Monocytes Relative 10/26/2023 11  % Final   Monocytes Absolute 10/26/2023 1.0  0.1 - 1.0 K/uL Final   Eosinophils Relative 10/26/2023 1  % Final   Eosinophils Absolute 10/26/2023 0.1  0.0 - 0.5 K/uL Final   Basophils Relative 10/26/2023 1  % Final   Basophils Absolute 10/26/2023 0.1  0.0 - 0.1 K/uL Final   Immature  Granulocytes 10/26/2023 0  % Final   Abs Immature Granulocytes 10/26/2023 0.03  0.00 - 0.07 K/uL Final   Performed at [facility], [address], [place], [place] [address]   Sodium 10/26/2023 139  135 - 145 mmol/L Final   Potassium 10/26/2023 3.6  3.5 - 5.1 mmol/L Final   Chloride 10/26/2023 106  98 - 111 mmol/L Final   CO2 10/26/2023 23  22 - 32 mmol/L Final   Glucose, Bld 10/26/2023 83  70 - 99 mg/dL Final   Glucose reference range applies only to samples taken after fasting for at least 8 hours.   BUN 10/26/2023 18  6 - 20 mg/dL Final   Creatinine, Ser 10/26/2023 0.72  0.44 - 1.00 mg/dL Final   Calcium 92/95/7974 9.5  8.9 - 10.3 mg/dL Final   Total Protein 92/95/7974 6.6  6.5 - 8.1 g/dL Final   Albumin 92/95/7974 3.9  3.5 - 5.0 g/dL Final   AST 92/95/7974 12 (L)  15 - 41 U/L Final   ALT 10/26/2023 11  0 - 44 U/L Final   Alkaline Phosphatase 10/26/2023 103  38 - 126 U/L Final   Total Bilirubin 10/26/2023 0.8  0.0 - 1.2 mg/dL Final   GFR, Estimated 10/26/2023 >60  >60 mL/min Final   Comment: (NOTE) Calculated using the CKD-EPI Creatinine Equation (2021)    Anion gap 10/26/2023 10  5 - 15 Final   Performed at [facility], [address] [place], [place] [address]   Free T4 10/26/2023 0.81  0.61 - 1.12 ng/dL Final   Comment: (NOTE) Biotin ingestion may interfere with free T4 tests. If the results are inconsistent with the TSH level, previous test results, or the clinical presentation, then consider biotin interference. If needed, order repeat testing after stopping biotin. Performed at [facility], [address], [place], [place] [address]   Admission on 10/23/2023, Discharged on 10/23/2023  Component Date Value Ref Range Status   WBC 10/23/2023 8.7  4.0 - 10.5  K/uL Final   RBC 10/23/2023 4.67  3.87 - 5.11 MIL/uL Final   Hemoglobin 10/23/2023 14.1  12.0 - 15.0 g/dL Final   HCT 92/98/7974 40.8  36.0 - 46.0 % Final   MCV 10/23/2023 87.4  80.0 - 100.0 fL  Final   MCH 10/23/2023 30.2  26.0 - 34.0 pg Final   MCHC 10/23/2023 34.6  30.0 - 36.0 g/dL Final   RDW 92/98/7974 12.1  11.5 - 15.5 % Final   Platelets 10/23/2023 279  150 - 400 K/uL Final   nRBC 10/23/2023 0.0  0.0 - 0.2 % Final   Neutrophils Relative % 10/23/2023 73  % Final   Neutro Abs 10/23/2023 6.3  1.7 - 7.7 K/uL Final   Lymphocytes Relative 10/23/2023 18  % Final   Lymphs Abs 10/23/2023 1.6  0.7 - 4.0 K/uL Final   Monocytes Relative 10/23/2023 7  % Final   Monocytes Absolute 10/23/2023 0.6  0.1 - 1.0 K/uL Final   Eosinophils Relative 10/23/2023 1  % Final   Eosinophils Absolute 10/23/2023 0.1  0.0 - 0.5 K/uL Final   Basophils Relative 10/23/2023 1  % Final   Basophils Absolute 10/23/2023 0.1  0.0 - 0.1 K/uL Final   Immature Granulocytes 10/23/2023 0  % Final   Abs Immature Granulocytes 10/23/2023 0.02  0.00 - 0.07 K/uL Final   Performed at [facility], [address], [place], [place] [address]   Sodium 10/23/2023 138  135 - 145 mmol/L Final   Potassium 10/23/2023 4.0  3.5 - 5.1 mmol/L Final   Chloride 10/23/2023 101  98 - 111 mmol/L Final   CO2 10/23/2023 25  22 - 32 mmol/L Final   Glucose, Bld 10/23/2023 106 (H)  70 - 99 mg/dL Final   Glucose reference range applies only to samples taken after fasting for at least 8 hours.   BUN 10/23/2023 10  6 - 20 mg/dL Final   Creatinine, Ser 10/23/2023 0.88  0.44 - 1.00 mg/dL Final   Calcium 92/98/7974 10.0  8.9 - 10.3 mg/dL Final   Total Protein 92/98/7974 6.8  6.5 - 8.1 g/dL Final   Albumin 92/98/7974 4.3  3.5 - 5.0 g/dL Final   AST 92/98/7974 15  15 - 41 U/L Final   ALT 10/23/2023 14  0 - 44 U/L Final   Alkaline Phosphatase 10/23/2023 128 (H)  38 - 126 U/L Final   Total Bilirubin 10/23/2023 0.7  0.0 - 1.2 mg/dL Final   GFR, Estimated 10/23/2023 >60  >60 mL/min Final   Comment: (NOTE) Calculated using the CKD-EPI Creatinine Equation (2021)    Anion gap 10/23/2023 12  5 - 15 Final   Performed at [facility],  [address], [place], [place] [address]   Hgb A1c MFr Bld 10/23/2023 4.9  4.8 - 5.6 % Final   Comment: (NOTE) Diagnosis of Diabetes The following HbA1c ranges recommended by the American Diabetes Association (ADA) may be used as an aid in the diagnosis of diabetes mellitus.  Hemoglobin             Suggested A1C NGSP%              Diagnosis  <5.7                   Non Diabetic  5.7-6.4                Pre-Diabetic  >6.4  Diabetic  <7.0                   Glycemic control for                       adults with diabetes.     Mean Plasma Glucose 10/23/2023 93.93  mg/dL Final   Performed at [facility], [address], [place], [place] [address]   Alcohol, Ethyl (B) 10/23/2023 <15  <15 mg/dL Final   Comment: (NOTE) For medical purposes only. Performed at [facility], [address] [place], [place] [address]    Cholesterol 10/23/2023 263 (H)  0 - 200 mg/dL Final   Triglycerides 92/98/7974 314 (H)  <150 mg/dL Final   HDL 92/98/7974 35 (L)  >40 mg/dL Final   Total CHOL/HDL Ratio 10/23/2023 7.5  RATIO Final   VLDL 10/23/2023 63 (H)  0 - 40 mg/dL Final   LDL Cholesterol 10/23/2023 165 (H)  0 - 99 mg/dL Final   Comment:        Total Cholesterol/HDL:CHD Risk Coronary Heart Disease Risk Table                     Men   Women  1/2 Average Risk   3.4   3.3  Average Risk       5.0   4.4  2 X Average Risk   9.6   7.1  3 X Average Risk  23.4   11.0        Use the calculated Patient Ratio above and the CHD Risk Table to determine the patient's CHD Risk.        ATP III CLASSIFICATION (LDL):  <100     mg/dL   Optimal  899-870  mg/dL   Near or Above                    Optimal  130-159  mg/dL   Borderline  839-810  mg/dL   High  >809     mg/dL   Very High Performed at [facility], [address] [place], [place] [address]    TSH 10/23/2023 7.886 (H)  0.350 - 4.500 uIU/mL Final   Comment: Performed by a 3rd Generation assay with a functional  sensitivity of <=0.01 uIU/mL. Performed at [facility], [address], [place], [place] [address]    Prolactin 10/23/2023 3.0 (L)  3.6 - 25.2 ng/mL Final   Comment: (NOTE) Performed At: [facility] [address] [place], [place] [id number] Jennette Shorter MD Ey:[id number]    POC Amphetamine UR 10/23/2023 None Detected  NONE DETECTED (Cut Off Level 1000 ng/mL) Final   POC Secobarbital (BAR) 10/23/2023 None Detected  NONE DETECTED (Cut Off Level 300 ng/mL) Final   POC Buprenorphine (BUP) 10/23/2023 None Detected  NONE DETECTED (Cut Off Level 10 ng/mL) Final   POC Oxazepam (BZO) 10/23/2023 Positive (A)  NONE DETECTED (Cut Off Level 300 ng/mL) Final   POC Cocaine UR 10/23/2023 None Detected  NONE DETECTED (Cut Off Level 300 ng/mL) Final   POC Methamphetamine UR 10/23/2023 None Detected  NONE DETECTED (Cut Off Level 1000 ng/mL) Final   POC Morphine 10/23/2023 None Detected  NONE DETECTED (Cut Off Level 300 ng/mL) Final   POC Methadone UR 10/23/2023 None Detected  NONE DETECTED (Cut Off Level 300 ng/mL) Final   POC Oxycodone  UR 10/23/2023 None Detected  NONE DETECTED (Cut Off Level 100  ng/mL) Final   POC Marijuana UR 10/23/2023 None Detected  NONE DETECTED (Cut Off Level 50 ng/mL) Final   Preg Test, Ur 10/23/2023 Negative  Negative Final  Admission on 06/13/2023, Discharged on 06/17/2023  Component Date Value Ref Range Status   Sodium 06/17/2023 139  135 - 145 mmol/L Final   Potassium 06/17/2023 3.5  3.5 - 5.1 mmol/L Final   Chloride 06/17/2023 104  98 - 111 mmol/L Final   CO2 06/17/2023 23  22 - 32 mmol/L Final   Glucose, Bld 06/17/2023 84  70 - 99 mg/dL Final   Glucose reference range applies only to samples taken after fasting for at least 8 hours.   BUN 06/17/2023 16  6 - 20 mg/dL Final   Creatinine, Ser 06/17/2023 0.88  0.44 - 1.00 mg/dL Final   Calcium 97/76/7974 9.5  8.9 - 10.3 mg/dL Final   GFR, Estimated 06/17/2023 >60  >60 mL/min Final   Comment:  (NOTE) Calculated using the CKD-EPI Creatinine Equation (2021)    Anion gap 06/17/2023 12  5 - 15 Final   Performed at [facility], [address] [place], [place] [address]  Admission on 06/12/2023, Discharged on 06/13/2023  Component Date Value Ref Range Status   Sodium 06/12/2023 136  135 - 145 mmol/L Final   Potassium 06/12/2023 3.1 (L)  3.5 - 5.1 mmol/L Final   Chloride 06/12/2023 101  98 - 111 mmol/L Final   CO2 06/12/2023 25  22 - 32 mmol/L Final   Glucose, Bld 06/12/2023 99  70 - 99 mg/dL Final   Glucose reference range applies only to samples taken after fasting for at least 8 hours.   BUN 06/12/2023 8  6 - 20 mg/dL Final   Creatinine, Ser 06/12/2023 0.80  0.44 - 1.00 mg/dL Final   Calcium 97/81/7974 9.1  8.9 - 10.3 mg/dL Final   Total Protein 97/81/7974 7.4  6.5 - 8.1 g/dL Final   Albumin 97/81/7974 4.3  3.5 - 5.0 g/dL Final   AST 97/81/7974 25  15 - 41 U/L Final   ALT 06/12/2023 20  0 - 44 U/L Final   Alkaline Phosphatase 06/12/2023 118  38 - 126 U/L Final   Total Bilirubin 06/12/2023 0.6  0.0 - 1.2 mg/dL Final   GFR, Estimated 06/12/2023 >60  >60 mL/min Final   Comment: (NOTE) Calculated using the CKD-EPI Creatinine Equation (2021)    Anion gap 06/12/2023 10  5 - 15 Final   Performed at [facility], [address], [place], [place] [address]   Alcohol, Ethyl (B) 06/12/2023 <10  <10 mg/dL Final   Comment: (NOTE) Lowest detectable limit for serum alcohol is 10 mg/dL.  For medical purposes only. Performed at [facility], [address], [place], [place] [address]    Opiates 06/12/2023 NONE DETECTED  NONE DETECTED Final   Cocaine 06/12/2023 NONE DETECTED  NONE DETECTED Final   Benzodiazepines 06/12/2023 POSITIVE (A)  NONE DETECTED Final   Amphetamines 06/12/2023 NONE DETECTED  NONE DETECTED Final   Tetrahydrocannabinol 06/12/2023 NONE DETECTED  NONE DETECTED Final   Barbiturates 06/12/2023 NONE DETECTED  NONE DETECTED Final   Comment: (NOTE) DRUG SCREEN FOR  MEDICAL PURPOSES ONLY.  IF CONFIRMATION IS NEEDED FOR ANY PURPOSE, NOTIFY LAB WITHIN 5 DAYS.  LOWEST DETECTABLE LIMITS FOR URINE DRUG SCREEN Drug Class                     Cutoff (ng/mL) Amphetamine and metabolites    1000  Barbiturate and metabolites    200 Benzodiazepine                 200 Opiates and metabolites        300 Cocaine and metabolites        300 THC                            50 Performed at [facility], [address] [place], [place] [address]    WBC 06/12/2023 12.4 (H)  4.0 - 10.5 K/uL Final   RBC 06/12/2023 4.62  3.87 - 5.11 MIL/uL Final   Hemoglobin 06/12/2023 14.8  12.0 - 15.0 g/dL Final   HCT 97/81/7974 42.0  36.0 - 46.0 % Final   MCV 06/12/2023 90.9  80.0 - 100.0 fL Final   MCH 06/12/2023 32.0  26.0 - 34.0 pg Final   MCHC 06/12/2023 35.2  30.0 - 36.0 g/dL Final   RDW 97/81/7974 12.8  11.5 - 15.5 % Final   Platelets 06/12/2023 295  150 - 400 K/uL Final   nRBC 06/12/2023 0.0  0.0 - 0.2 % Final   Neutrophils Relative % 06/12/2023 76  % Final   Neutro Abs 06/12/2023 9.4 (H)  1.7 - 7.7 K/uL Final   Lymphocytes Relative 06/12/2023 13  % Final   Lymphs Abs 06/12/2023 1.7  0.7 - 4.0 K/uL Final   Monocytes Relative 06/12/2023 7  % Final   Monocytes Absolute 06/12/2023 0.8  0.1 - 1.0 K/uL Final   Eosinophils Relative 06/12/2023 3  % Final   Eosinophils Absolute 06/12/2023 0.4  0.0 - 0.5 K/uL Final   Basophils Relative 06/12/2023 1  % Final   Basophils Absolute 06/12/2023 0.1  0.0 - 0.1 K/uL Final   Immature Granulocytes 06/12/2023 0  % Final   Abs Immature Granulocytes 06/12/2023 0.03  0.00 - 0.07 K/uL Final   Performed at [facility], [address] [place], [place] [address]   Magnesium  06/12/2023 2.1  1.7 - 2.4 mg/dL Final   Performed at [facility], [address] [place], [place] [address]    Blood Alcohol level:  Lab Results  Component Value Date   [facility] <15 10/23/2023   ETH <10 06/12/2023    Metabolic Disorder Labs: Lab Results  Component Value  Date   HGBA1C 4.9 10/23/2023   MPG 93.93 10/23/2023   MPG 111.15 03/20/2017   Lab Results  Component Value Date   PROLACTIN 3.0 (L) 10/23/2023   Lab Results  Component Value Date   CHOL 263 (H) 10/23/2023   TRIG 314 (H) 10/23/2023   HDL 35 (L) 10/23/2023   CHOLHDL 7.5 10/23/2023   VLDL 63 (H) 10/23/2023   LDLCALC 165 (H) 10/23/2023   LDLCALC UNABLE TO CALCULATE IF TRIGLYCERIDE OVER 400 mg/dL 88/72/7981    Therapeutic Lab Levels: No results found for: LITHIUM No results found for: VALPROATE No results found for: CBMZ  Physical Findings   AIMS    Flowsheet Row [facility]  AIMS Total Score 0   AUDIT    Flowsheet Row Admission (Discharged) from 03/12/2017 in [facility] Ad[facility]  Alcohol Use Disorder Identification Test Final Score (AUDIT) 1 2   PHQ2-9    Flowsheet Row ED from 10/23/2023 in [facility] ED from 06/13/2023 in [facility]  PHQ-2  Total Score 4 0  PHQ-9 Total Score 11 0   Flowsheet Row ED from 10/23/2023 in [facility] Most recent reading at 10/23/2023  5:08 PM ED from 10/23/2023 in [facility] Most recent reading at 10/23/2023  1:48 PM ED from 06/13/2023 in [facility] Most recent reading at 06/14/2023 11:39 AM  C-SSRS RISK CATEGORY Low Risk High Risk High Risk     Musculoskeletal  Strength & Muscle Tone: within normal limits Gait & Station: normal Patient leans: N/A  Psychiatric Specialty Exam  Presentation  General Appearance:  Casual; Fairly Groomed  Eye Contact: Good  Speech: Clear and Coherent  Speech Volume: Normal  Handedness: Right   Mood and Affect  Mood: Anxious  Affect: Congruent   Thought Process  Thought  Processes: Coherent  Descriptions of Associations:Intact  Orientation:Full (Time, Place and Person)  Thought Content:WDL  Diagnosis of Schizophrenia or Schizoaffective disorder in past: No    Hallucinations:Hallucinations: None  Ideas of Reference:None  Suicidal Thoughts:Suicidal Thoughts: No  Homicidal Thoughts:Homicidal Thoughts: No   Sensorium  Memory: Recent Fair; Immediate Good  Judgment: Fair  Insight: Fair   Chartered certified accountant: Fair  Attention Span: Fair  Recall: [employer] of Knowledge: Fair  Language: Fair   Psychomotor Activity  Psychomotor Activity: Psychomotor Activity: Normal   Assets  Assets: Communication Skills; Desire for Improvement; Financial Resources/Insurance; Housing; Physical Health; Resilience; Social Support; Vocational/Educational   Sleep  Sleep: Sleep: Fair  Estimated Sleeping Duration (Last 24 Hours): 6.00-7.50 hours  No data recorded  Physical Exam  Physical Exam Vitals and nursing note reviewed.  Constitutional:      Appearance: Normal appearance.  HENT:     Head: Normocephalic.     Nose: Nose normal.  Eyes:     Extraocular Movements: Extraocular movements intact.  Cardiovascular:     Rate and Rhythm: Normal rate.  Pulmonary:     Effort: Pulmonary effort is normal.  Musculoskeletal:        General: Normal range of motion.     Cervical back: Normal range of motion.  Neurological:     General: No focal deficit present.     Mental Status: She is alert and oriented to person, place, and time.    Review of Systems  Constitutional: Negative.   HENT: Negative.    Eyes: Negative.   Respiratory: Negative.    Cardiovascular: Negative.   Gastrointestinal: Negative.   Genitourinary: Negative.   Musculoskeletal:  Positive for back pain.  Neurological: Negative.   Endo/Heme/Allergies: Negative.   Psychiatric/Behavioral:  Positive for depression and substance abuse. The patient is  nervous/anxious.    Blood pressure 104/76, pulse 60, temperature 98.4 F (36.9 C), temperature source Oral, resp. rate 17, SpO2 100%. There is no height or weight on file to calculate BMI.  Treatment Plan Summary: Daily contact with patient to assess and evaluate symptoms and progress in treatment  Pt denies current suicidal/ homicidal ideations and psychotic symptoms. Withdrawal symptoms continue to improve with the addition of Gabapentin  200mg  TID. Sleep has improved with Seroquel  increased to 100mg  at bedtime. Most recent COWs score was 0. Discussed Suboxone for cravings and withdrawal symptoms, pt declined due to lack of insurance. Pt  has an appointment at [facility] for [facility] on Monday 10/29/23. Plans to discharge home tomorrow if stable. Pt spoke with husband who agreed to come get her once discharged.   Medications: - Conintue Seroquel  to 100 mg nightly for anxiety,  mood and sleep. (Per chart review 05/2021, patient was previously taking Seroquel  50 mg daily, Seroquel  100 mg nightly and also had Seroquel  50 mg as needed for anxiety.) - Continue trazodone  100 mg nightly for sleep - Continue agitation protocol per policy - Continue levothyroxine  50 mcg daily for hypothyroidism   Fentanyl use: -Continue COWS protocol to monitor withdrawal symptoms - Continue clonidine  taper for withdrawal symptoms - Continue Bentyl  20 mg every 6 hours as needed for abdominal cramping - Continue loperamide  2 to 4 mg as needed for diarrhea - Continue methocarbamol  500 mg every 8 hours as needed for muscle spasms - Continue naproxen  500 mg twice daily as needed for pain - Continue ondansetron  4 mg every 6 hours as needed for nausea vomiting - Start Gabapentin  200mg  TID for w/d symptoms.     Alan JAYSON Mcardle, NP 10/26/2023 11:46 AM

## 2023-10-26 NOTE — ED Notes (Signed)
 Patient is in the bedroom sleeping calm and composed. NAD. Respirations even and unlabored. Will monitor for safety.

## 2023-10-26 NOTE — ED Notes (Signed)
 Pt approached nurses station requesting medication for diarrhea. Immodium given without difficulty. Pt states, I think it's because I need to eat something. I haven't had much of an appetite lately. Pt currently eating nutrigrain bar. Tolerating well. Will continue to monitor for safety.

## 2023-10-26 NOTE — Group Note (Signed)
 Group Topic: Communication  Group Date: 10/26/2023 Start Time: 1710 End Time: 1750 Facilitators: Daved Tinnie HERO, RN  Department: [facility]  Number of Participants: 9  Group Focus: communication Treatment Modality:  Psychoeducation Interventions utilized were patient education Purpose: improve communication skills  Name: AROUSH CHASSE Date of Birth: 1970/06/08  MR: [id number]    Level of Participation: active Quality of Participation: attentive and cooperative Interactions with others: gave feedback Mood/Affect: appropriate Triggers (if applicable): n/a Cognition: coherent/clear Progress: Significant Response: pt says she will help regulate her emotions by doing breathing exercises  Plan: patient will be encouraged to engage in future RN education groups  Patients Problems:  Patient Active Problem List   Diagnosis Date Noted   Opioid dependence with opioid-induced mood disorder (HCC) 10/23/2023   Fentanyl use disorder, severe (HCC) 06/13/2023   Mixed connective tissue disease (HCC) 06/13/2023   Substance induced mood disorder (HCC) 06/13/2023   High triglycerides 03/21/2017   Tobacco use disorder 03/21/2017   Chronic pain syndrome 03/21/2017   Major depressive disorder, recurrent, severe with psychotic features (HCC) 03/12/2017   Suicidal ideation    MDD (major depressive disorder), recurrent episode, severe (HCC) 05/03/2016   GAD (generalized anxiety disorder) 05/13/2015   Myalgia 08/07/2014   Positive ANA (antinuclear antibody) 08/07/2014   Hypothyroidism 08/07/2014

## 2023-10-26 NOTE — Group Note (Signed)
 Group Topic: Brain Fitness  Group Date: 10/26/2023 Start Time: 1500 End Time: 1545 Facilitators: Auburn Stephane HERO, [place]  Department: [facility]  Number of Participants: 4  Group Focus: clarity of thought, coping skills, daily focus, and feeling awareness/expression Treatment Modality:  Psychoeducation Interventions utilized were clarification, exploration, mental fitness, patient education, problem solving, and reality testing Purpose: enhance coping skills, explore maladaptive thinking, express feelings, increase insight, regain self-worth, reinforce self-care, and relapse prevention strategies  Name: Victoria Galvan Date of Birth: [birth year]  MR: [id number]    Level of Participation: minimal Quality of Participation: attention seeking, attentive, distractible, engaged, offered feedback, and oppositional Interactions with others: gave feedback Mood/Affect: blunted, depressed, and flat Triggers (if applicable): thoughts of drinking Cognition: blaming, confused, and obsessive Progress: Gaining insight Response:  Patient stated she will miss how much fun the high was. Plan: patient will be encouraged to follow up with her outpatient SAIOP appointment.  Patients Problems:  Patient Active Problem List   Diagnosis Date Noted   Opioid dependence with opioid-induced mood disorder (HCC) 10/23/2023   Fentanyl use disorder, severe (HCC) 06/13/2023   Mixed connective tissue disease (HCC) 06/13/2023   Substance induced mood disorder (HCC) 06/13/2023   High triglycerides 03/21/2017   Tobacco use disorder 03/21/2017   Chronic pain syndrome 03/21/2017   Major depressive disorder, recurrent, severe with psychotic features (HCC) 03/12/2017   Suicidal ideation    MDD (major depressive disorder), recurrent episode, severe (HCC) 05/03/2016   GAD (generalized anxiety disorder) 05/13/2015   Myalgia 08/07/2014   Positive ANA (antinuclear antibody) 08/07/2014   Hypothyroidism  08/07/2014

## 2023-10-27 MED ORDER — HYDROXYZINE HCL 25 MG PO TABS: 25.0000 mg | ORAL_TABLET | Freq: Four times a day (QID) | ORAL | 0 refills | Status: AC | PRN

## 2023-10-27 MED ORDER — TRAZODONE HCL 100 MG PO TABS: 100.0000 mg | ORAL_TABLET | Freq: Every day | ORAL | 0 refills | Status: AC

## 2023-10-27 MED ORDER — NAPROXEN 500 MG PO TABS: 500.0000 mg | ORAL_TABLET | Freq: Two times a day (BID) | ORAL | Status: AC | PRN

## 2023-10-27 MED ORDER — QUETIAPINE FUMARATE 100 MG PO TABS: 100.0000 mg | ORAL_TABLET | Freq: Every day | ORAL | 0 refills | Status: AC

## 2023-10-27 MED ORDER — LEVOTHYROXINE SODIUM 50 MCG PO TABS: 50.0000 ug | ORAL_TABLET | Freq: Every day | ORAL | 0 refills | Status: AC

## 2023-10-27 MED ORDER — GABAPENTIN 100 MG PO CAPS: 200.0000 mg | ORAL_CAPSULE | Freq: Three times a day (TID) | ORAL | 0 refills | Status: AC

## 2023-10-27 NOTE — ED Notes (Signed)
 Patient discharged home with SAIOP at [facility] per MD order. After Visit Summary (AVS) printed and given to patient, as well as sample RXs. AVS reviewed with patient and all questions fully answered. Patient discharged in no acute distress, A& O x4 and ambulatory. Patient denied SI/HI, A/VH upon discharge. Patient verbalized understanding of all discharge instructions explained by staff, including follow up appointments, RX's and safety plan. Patient mood fair. Patient belongings returned to patient from locker complete and intact. Patient escorted to lobby via staff for transport to destination. Safety maintained.

## 2023-10-27 NOTE — ED Notes (Signed)
 PRN Imodium  given due to patient reports of diarrhea. Medication administered with no complications. Environment secured, safety checks in place per facility policy.

## 2023-10-27 NOTE — ED Notes (Signed)
 Patient sitting in dayroom interacting with peers. No acute distress noted. No concerns voiced. Informed patient to notify staff with any needs or assistance. Patient verbalized understanding or agreement. Safety checks in place per facility policy.

## 2023-10-27 NOTE — ED Notes (Signed)
 Patient resting with eyes closed in no apparent acute distress. Respirations even and unlabored. Environment secured. Safety checks in place according to facility policy.

## 2023-10-27 NOTE — ED Notes (Signed)
 Patient alert & oriented x4. Denies intent to harm self or others when asked. Denies A/VH. Patient denies any physical complaints when asked. No acute distress noted. Scheduled and PRN medications administered with no complications. Patient planned to discharge later this afternoon around 1400. Patient voices no concerns regarding discharge plan at this time. Support and encouragement provided. Routine safety checks conducted per facility protocol. Encouraged patient to notify staff if any thoughts of harm towards self or others arise. Patient verbalizes understanding and agreement.

## 2023-10-27 NOTE — ED Notes (Signed)
 Patient resting quietly in bed with eyes closed with unlabored breathing. Q 15 minute safety checks remain in place.  Pt remains safe on the unit at this time.

## 2023-10-27 NOTE — Group Note (Signed)
 Group Topic: Communication  Group Date: 10/27/2023 Start Time: 0900 End Time: 0930 Facilitators: Nena Lesley PARAS, NT  Department: [facility]  Number of Participants: 7  Group Focus: check in and community group Treatment Modality:  Psychoeducation Interventions utilized were clarification, exploration, support, and problem solving Purpose: explore maladaptive thinking, improve communication skills, and increase insight  Name: Victoria Galvan Date of Birth: 05/09/1970  MR: [id number]    Level of Participation: when cued Quality of Participation: attention seeking and immature Interactions with others: gave feedback Mood/Affect: blunted Triggers (if applicable): N/A Cognition: loose, no insight, and not focused Progress: Minimal Response: PT whispered comments to herself during the group when displeased with the expectations of the milieu. PT did not voice any specific concerns when asked if there were any questions, comments or complaints  Plan: patient will be encouraged to complete her safety plan before discharging today   Patients Problems:  Patient Active Problem List   Diagnosis Date Noted   Opioid dependence with opioid-induced mood disorder (HCC) 10/23/2023   Fentanyl use disorder, severe (HCC) 06/13/2023   Mixed connective tissue disease (HCC) 06/13/2023   Substance induced mood disorder (HCC) 06/13/2023   High triglycerides 03/21/2017   Tobacco use disorder 03/21/2017   Chronic pain syndrome 03/21/2017   Major depressive disorder, recurrent, severe with psychotic features (HCC) 03/12/2017   Suicidal ideation    MDD (major depressive disorder), recurrent episode, severe (HCC) 05/03/2016   GAD (generalized anxiety disorder) 05/13/2015   Myalgia 08/07/2014   Positive ANA (antinuclear antibody) 08/07/2014   Hypothyroidism 08/07/2014

## 2023-10-27 NOTE — ED Notes (Signed)
 Pt is observed watching television in the dayroom. Pt denies SI/HI/AVH. Pt c/o stomach cramp, diarrhea, runny nose, anxiety, and generalized body aches rated 4/10. PRN Imodium  2mg  and Bentyl  given as per orders. Pt is safe on the unit at this time with Q 15 min safety checks in place.

## 2023-10-27 NOTE — ED Provider Notes (Signed)
 FBC/OBS ASAP Discharge Summary  Date and Time: 10/27/2023 10:06 AM  Name: Victoria Galvan  MRN:  [id number]   Discharge Diagnoses:  Final diagnoses:  Substance induced mood disorder (HCC)  Opioid use disorder  MDD (major depressive disorder), recurrent severe, without psychosis (HCC)   HPI:  Victoria Galvan is a 53 y.o. female with a history of Opoid use d/o, MDD & GAD who presented to this [facility] on accompanied on 10/23/2023 requesting detox from opioids. She reported an Opioid addiction spanning over the past 30 years.   Stay Summary: Patient was recommended for inpatient hospitalization for detoxification from opioids, and treatment and stabilization of her mental health status.  She was hospitalized at the facility based crisis center of this [facility] behavioral health center starting 10/23/2023. During the patient's hospitalization, patient had extensive initial psychiatric evaluation, and follow-up psychiatric evaluations every day.  Psychiatric diagnoses provided upon initial assessment: MDD, Opioid use d/o, GAD, insomnia.  Patient's psychiatric medications were adjusted on admission, and she was started on a Clonidine  taper as well as supportive medications (Robaxin /Imodium /naproxen /Zofran  for muscle pain, diarrhea, generalized pain, and nausea respectively as needed) for symptomatic treatment of Opioid withdrawals. Patient tolerated medications well, and has now completed the Clonidine  taper.   Medications at discharge are as follows: -Continue hydroxyzine  25 mg 3 times daily as needed for anxiety -Continue trazodone  100 mg nightly for sleep -Continue Seroquel  100 mg nightly for moor stabilization. We discussed addiing a Serotonergic agent for treatment of depressive symptoms, but pt prefers to continue taking Seroquel  alone, declines antidepressant at this time.  Patient verbalized concerns about medications affordability, and scripts sent to [facility]  in [place], [place] as [employer] are more affordable at $5 per script per GoodRx. 7 days samples of medications requested from pharmacy since patient has no insurance.  Patient's care was discussed during the interdisciplinary team meeting every day during the hospitalization. The patient denies having side effects to prescribed psychiatric medication. The patient was evaluated each day by a clinical provider to ascertain response to treatment. Improvement was noted by the patient's report of decreasing symptoms, improved sleep and appetite, affect, medication tolerance, behavior, and participation in unit programming.  Patient was asked each day to complete a self inventory noting mood, mental status, pain, new symptoms, anxiety and concerns.  Symptoms were reported as significantly decreased or resolved completely by discharge.   On day of discharge, the patient reports that their mood is stable. The patient denied having suicidal thoughts for more than 48 hours prior to discharge.  Patient denies having homicidal thoughts.  Patient denies having auditory hallucinations.  Patient denies any visual hallucinations or other symptoms of psychosis. The patient was motivated to continue taking medication with a goal of continued improvement in mental health.   The patient reports their target psychiatric symptoms of Opioid withdrawals responded well to the psychiatric medications, and the patient reports overall benefit from this psychiatric hospitalization. Supportive psychotherapy was provided to the patient. The patient also participated in regular group therapy while hospitalized. Coping skills, problem solving as well as relaxation therapies were also part of the unit programming.  Labs were reviewed with the patient, and abnormal results were discussed with the patient. Education provided for patient to follow up with PCP regarding management of hypothyroidism.  The patient is able to verbalize their  individual safety plan to this provider.  # It is recommended to the patient to continue psychiatric medications as prescribed, after  discharge from the hospital.    # It is recommended to the patient to follow up with your outpatient psychiatric provider and PCP.  # It was discussed with the patient, the impact of alcohol, drugs, tobacco have been there overall psychiatric and medical wellbeing, and total abstinence from substance use was recommended the patient.ed.  # Prescriptions provided or sent directly to preferred pharmacy at discharge. Patient agreeable to plan. Given opportunity to ask questions. Appears to feel comfortable with discharge.    # In the event of worsening symptoms, the patient is instructed to call the crisis hotline, [phone] and or go to the nearest ED for appropriate evaluation and treatment of symptoms. To follow-up with primary care provider for other medical issues, concerns and or health care needs  # Patient was discharged home with a plan to follow up as noted below.   Total Time spent with patient: 45 minutes  Past Medical History: Hypothyroidism Family History: see H & P Family Psychiatric History: see H & P Social History: see H & P Tobacco Cessation:  A prescription for an FDA-approved tobacco cessation medication was offered at discharge and the patient refused  Current Medications:  Current Facility-Administered Medications  Medication Dose Route Frequency Provider Last Rate Last Admin   acetaminophen  (TYLENOL ) tablet 650 mg  650 mg Oral Q6H PRN Olasunkanmi, Oluwatosin, NP       alum & mag hydroxide-simeth (MAALOX/MYLANTA) 200-200-20 MG/5ML suspension 30 mL  30 mL Oral Q4H PRN Olasunkanmi, Oluwatosin, NP   30 mL at 10/26/23 1548   [START ON 10/28/2023] cloNIDine  (CATAPRES ) tablet 0.1 mg  0.1 mg Oral QAC breakfast Olasunkanmi, Oluwatosin, NP       dicyclomine  (BENTYL ) tablet 20 mg  20 mg Oral Q6H PRN Olasunkanmi, Oluwatosin, NP   20 mg at 10/26/23 2123    diphenhydrAMINE  (BENADRYL ) capsule 50 mg  50 mg Oral Q6H PRN Olasunkanmi, Oluwatosin, NP   50 mg at 10/25/23 0105   gabapentin  (NEURONTIN ) capsule 200 mg  200 mg Oral TID Zouev, Dmitri, MD   200 mg at 10/27/23 0920   hydrOXYzine  (ATARAX ) tablet 25 mg  25 mg Oral Q6H PRN Olasunkanmi, Oluwatosin, NP   25 mg at 10/27/23 9374   levothyroxine  (SYNTHROID ) tablet 50 mcg  50 mcg Oral QAC breakfast Olasunkanmi, Oluwatosin, NP   50 mcg at 10/27/23 9480   loperamide  (IMODIUM ) capsule 2-4 mg  2-4 mg Oral PRN Olasunkanmi, Oluwatosin, NP   2 mg at 10/27/23 9074   magnesium  hydroxide (MILK OF MAGNESIA) suspension 30 mL  30 mL Oral Daily PRN Olasunkanmi, Oluwatosin, NP       methocarbamol  (ROBAXIN ) tablet 500 mg  500 mg Oral Q8H PRN Olasunkanmi, Oluwatosin, NP   500 mg at 10/25/23 1839   naproxen  (NAPROSYN ) tablet 500 mg  500 mg Oral BID PRN Olasunkanmi, Oluwatosin, NP   500 mg at 10/24/23 9046   nystatin  (MYCOSTATIN ) 100000 UNIT/ML suspension 500,000 Units  5 mL Mouth/Throat QID Zouev, Dmitri, MD   500,000 Units at 10/27/23 0919   OLANZapine  (ZYPREXA ) injection 5 mg  5 mg Intramuscular TID PRN Olasunkanmi, Oluwatosin, NP       OLANZapine  zydis (ZYPREXA ) disintegrating tablet 5 mg  5 mg Oral TID PRN Olasunkanmi, Oluwatosin, NP       ondansetron  (ZOFRAN -ODT) disintegrating tablet 4 mg  4 mg Oral Q6H PRN Olasunkanmi, Oluwatosin, NP   4 mg at 10/25/23 1356   QUEtiapine  (SEROQUEL ) tablet 100 mg  100 mg Oral QHS Brent, Amanda C, NP  100 mg at 10/26/23 2123   traZODone  (DESYREL ) tablet 100 mg  100 mg Oral QHS Zouev, Dmitri, MD   100 mg at 10/26/23 2123   Current Outpatient Medications  Medication Sig Dispense Refill   gabapentin  (NEURONTIN ) 300 MG capsule Take 1 capsule (300 mg total) by mouth 2 (two) times daily. (Patient taking differently: Take 300 mg by mouth 2 (two) times daily as needed (For anxiety).) 60 capsule 0   levothyroxine  (SYNTHROID ) 50 MCG tablet Take 50 mcg by mouth every morning.     loperamide   (IMODIUM ) 2 MG capsule Take 2 mg by mouth 2 (two) times daily as needed for diarrhea or loose stools.     QUEtiapine  (SEROQUEL ) 50 MG tablet Take 1 tablet (50 mg total) by mouth every 6 (six) hours as needed (for anxiety). (Patient taking differently: Take 50 mg by mouth at bedtime.) 30 tablet 0    PTA Medications:  Facility Ordered Medications  Medication   acetaminophen  (TYLENOL ) tablet 650 mg   alum & mag hydroxide-simeth (MAALOX/MYLANTA) 200-200-20 MG/5ML suspension 30 mL   magnesium  hydroxide (MILK OF MAGNESIA) suspension 30 mL   OLANZapine  (ZYPREXA ) injection 5 mg   OLANZapine  zydis (ZYPREXA ) disintegrating tablet 5 mg   diphenhydrAMINE  (BENADRYL ) capsule 50 mg   dicyclomine  (BENTYL ) tablet 20 mg   hydrOXYzine  (ATARAX ) tablet 25 mg   loperamide  (IMODIUM ) capsule 2-4 mg   methocarbamol  (ROBAXIN ) tablet 500 mg   naproxen  (NAPROSYN ) tablet 500 mg   ondansetron  (ZOFRAN -ODT) disintegrating tablet 4 mg   [COMPLETED] cloNIDine  (CATAPRES ) tablet 0.1 mg   Followed by   [COMPLETED] cloNIDine  (CATAPRES ) tablet 0.1 mg   Followed by   NOREEN ON 10/28/2023] cloNIDine  (CATAPRES ) tablet 0.1 mg   levothyroxine  (SYNTHROID ) tablet 50 mcg   traZODone  (DESYREL ) tablet 100 mg   [COMPLETED] naproxen  (NAPROSYN ) tablet 500 mg   [COMPLETED] methocarbamol  (ROBAXIN ) tablet 500 mg   QUEtiapine  (SEROQUEL ) tablet 100 mg   gabapentin  (NEURONTIN ) capsule 200 mg   nystatin  (MYCOSTATIN ) 100000 UNIT/ML suspension 500,000 Units   PTA Medications  Medication Sig   loperamide  (IMODIUM ) 2 MG capsule Take 2 mg by mouth 2 (two) times daily as needed for diarrhea or loose stools.   gabapentin  (NEURONTIN ) 300 MG capsule Take 1 capsule (300 mg total) by mouth 2 (two) times daily. (Patient taking differently: Take 300 mg by mouth 2 (two) times daily as needed (For anxiety).)   QUEtiapine  (SEROQUEL ) 50 MG tablet Take 1 tablet (50 mg total) by mouth every 6 (six) hours as needed (for anxiety). (Patient taking differently:  Take 50 mg by mouth at bedtime.)       10/26/2023   12:33 PM 10/24/2023    3:22 PM 06/17/2023    2:12 PM  Depression screen PHQ 2/9  Decreased Interest 1 2 0  Down, Depressed, Hopeless 1 2 0  PHQ - 2 Score 2 4 0  Altered sleeping 1 1 0  Tired, decreased energy 1 1 0  Change in appetite 1 1 0  Feeling bad or failure about yourself  0 1 0  Trouble concentrating 0 1 0  Moving slowly or fidgety/restless 0 1 0  Suicidal thoughts 0 1 0  PHQ-9 Score 5 11 0  Difficult doing work/chores Somewhat difficult Very difficult Not difficult at all    Flowsheet Row ED from 10/23/2023 in [facility] Most recent reading at 10/23/2023  5:08 PM ED from 10/23/2023 in [facility] Most recent reading at 10/23/2023  1:48 PM ED from 06/13/2023 in [facility] Most recent reading at 06/14/2023 11:39 AM  C-SSRS RISK CATEGORY Low Risk High Risk High Risk    Musculoskeletal  Strength & Muscle Tone: within normal limits Gait & Station: normal Patient leans: N/A  Psychiatric Specialty Exam  Presentation  General Appearance:  Casual  Eye Contact: Fair  Speech: Clear and Coherent  Speech Volume: Normal  Handedness: Right   Mood and Affect  Mood: Euthymic  Affect: Congruent   Thought Process  Thought Processes: Coherent  Descriptions of Associations:Intact  Orientation:Full (Time, Place and Person)  Thought Content:Logical  Diagnosis of Schizophrenia or Schizoaffective disorder in past: No    Hallucinations:Hallucinations: None  Ideas of Reference:None  Suicidal Thoughts:Suicidal Thoughts: No  Homicidal Thoughts:Homicidal Thoughts: No   Sensorium  Memory: Immediate Fair  Judgment: Good  Insight: Good   Executive Functions  Concentration: Good  Attention Span: Good  Recall: Good  Fund of Knowledge: Good  Language: Good   Psychomotor Activity  Psychomotor  Activity: Psychomotor Activity: Normal   Assets  Assets: Desire for Improvement; Resilience   Sleep  Sleep: Sleep: Good  Estimated Sleeping Duration (Last 24 Hours): 8.75-10.25 hours  No data recorded  Physical Exam  Physical Exam Constitutional:      Appearance: Normal appearance.  Neurological:     Mental Status: She is alert.  Psychiatric:        Mood and Affect: Mood normal.        Behavior: Behavior normal.        Thought Content: Thought content normal.        Judgment: Judgment normal.    Review of Systems  Psychiatric/Behavioral:  Positive for depression (stable for managment outpatient. Denies intent or plan to harm self) and substance abuse. Negative for hallucinations, memory loss and suicidal ideas (educated on cessation). The patient is nervous/anxious (stable) and has insomnia (stable).   All other systems reviewed and are negative.  Blood pressure 109/71, pulse 75, temperature 99.6 F (37.6 C), temperature source Oral, resp. rate 18, SpO2 96%. There is no height or weight on file to calculate BMI.  Demographic Factors:  Unemployed  Loss Factors: Financial problems/change in socioeconomic status  Historical Factors: Prior suicide attempts  Risk Reduction Factors:   Sense of responsibility to family and Living with another person, especially a relative  Continued Clinical Symptoms:  Previous Psychiatric Diagnoses and Treatments  Cognitive Features That Contribute To Risk:  None    Suicide Risk:  Minimal: No identifiable suicidal ideation.  Patients presenting with no risk factors but with morbid ruminations; may be classified as minimal risk based on the severity of the depressive symptoms. Patient denies intent and denies plan to harm self or others.  Disposition: Home with husband. Per CSW: She is scheduled for her follow up with [facility] on Monday 7/7 for SAIOP assessment. She has transportation to home and drove self here. No other concerns  noted or reported.  Donia Snell, NP 10/27/2023, 10:06 AM

## 2023-10-27 NOTE — Group Note (Signed)
 Group Topic: Relapse and Recovery  Group Date: 10/27/2023 Start Time: 1345 End Time: 1354 Facilitators: Nocole Zammit, Zane HERO, RN  Department: [facility]  Number of Participants: 1  Group Focus: discharge education Treatment Modality:  Individual Therapy Interventions utilized were patient education Purpose: increase insight  Name: Victoria Galvan Date of Birth: 25-Aug-1970  MR: [id number]    Level of Participation: moderate Quality of Participation: cooperative Interactions with others: minimal Mood/Affect: restless and rushed Triggers (if applicable): None identified at this time Cognition: coherent/clear Progress: Moderate Response: Patient voiced understanding of all discharge instructions as presented to her. AVS reviewed, as well as suicide safety plan, RXs, medication samples, and follow up appointments. Patient stated you don't have to review all that - I understand Plan: patient will be encouraged to attend [facility] appointment on Monday at 11a at [facility]  Patients Problems:  Patient Active Problem List   Diagnosis Date Noted   Opioid dependence with opioid-induced mood disorder (HCC) 10/23/2023   Fentanyl use disorder, severe (HCC) 06/13/2023   Mixed connective tissue disease (HCC) 06/13/2023   Substance induced mood disorder (HCC) 06/13/2023   High triglycerides 03/21/2017   Tobacco use disorder 03/21/2017   Chronic pain syndrome 03/21/2017   Major depressive disorder, recurrent, severe with psychotic features (HCC) 03/12/2017   Suicidal ideation    MDD (major depressive disorder), recurrent episode, severe (HCC) 05/03/2016   GAD (generalized anxiety disorder) 05/13/2015   Myalgia 08/07/2014   Positive ANA (antinuclear antibody) 08/07/2014   Hypothyroidism 08/07/2014
